# Patient Record
Sex: Male | Born: 1945 | Race: Black or African American | Hispanic: No | Marital: Single | Smoking: Never smoker
Health system: Southern US, Community
[De-identification: ages and names within clinical notes are randomized; demographics above are authoritative.]

## PROBLEM LIST (undated history)

## (undated) DIAGNOSIS — K219 Gastro-esophageal reflux disease without esophagitis: Secondary | ICD-10-CM

## (undated) DIAGNOSIS — I2510 Atherosclerotic heart disease of native coronary artery without angina pectoris: Secondary | ICD-10-CM

## (undated) DIAGNOSIS — E119 Type 2 diabetes mellitus without complications: Secondary | ICD-10-CM

## (undated) DIAGNOSIS — I10 Essential (primary) hypertension: Secondary | ICD-10-CM

## (undated) DIAGNOSIS — N189 Chronic kidney disease, unspecified: Secondary | ICD-10-CM

## (undated) DIAGNOSIS — E785 Hyperlipidemia, unspecified: Secondary | ICD-10-CM

## (undated) HISTORY — PX: CARDIAC SURGERY: SHX584

## (undated) HISTORY — PX: PROSTATE SURGERY: SHX751

## (undated) HISTORY — PX: SHOULDER ARTHROSCOPY: SHX128

## (undated) HISTORY — PX: CARDIAC CATHETERIZATION: SHX172

---

## 2003-08-14 ENCOUNTER — Other Ambulatory Visit: Payer: Self-pay

## 2004-05-08 ENCOUNTER — Emergency Department: Payer: Self-pay | Admitting: Internal Medicine

## 2004-05-08 ENCOUNTER — Other Ambulatory Visit: Payer: Self-pay

## 2007-04-09 ENCOUNTER — Other Ambulatory Visit: Payer: Self-pay

## 2007-04-09 ENCOUNTER — Emergency Department: Payer: Self-pay | Admitting: Unknown Physician Specialty

## 2008-01-11 ENCOUNTER — Other Ambulatory Visit: Payer: Self-pay

## 2008-01-11 ENCOUNTER — Observation Stay: Payer: Self-pay | Admitting: Oral and Maxillofacial Surgery

## 2008-03-19 ENCOUNTER — Emergency Department: Payer: Self-pay

## 2009-01-10 ENCOUNTER — Inpatient Hospital Stay: Payer: Self-pay | Admitting: Internal Medicine

## 2009-01-26 ENCOUNTER — Inpatient Hospital Stay: Payer: Self-pay | Admitting: Internal Medicine

## 2009-04-09 ENCOUNTER — Ambulatory Visit: Payer: Self-pay | Admitting: Gastroenterology

## 2009-08-07 ENCOUNTER — Observation Stay: Payer: Self-pay | Admitting: Specialist

## 2009-11-30 DEATH — deceased

## 2010-07-25 ENCOUNTER — Observation Stay: Payer: Self-pay | Admitting: Internal Medicine

## 2010-12-26 ENCOUNTER — Emergency Department: Payer: Self-pay | Admitting: Unknown Physician Specialty

## 2011-09-18 ENCOUNTER — Inpatient Hospital Stay: Payer: Self-pay | Admitting: Internal Medicine

## 2011-09-18 LAB — CBC WITH DIFFERENTIAL/PLATELET
Basophil #: 0.1 10*3/uL (ref 0.0–0.1)
Eosinophil %: 0.7 %
HCT: 36.3 % — ABNORMAL LOW (ref 40.0–52.0)
HGB: 12 g/dL — ABNORMAL LOW (ref 13.0–18.0)
Lymphocyte #: 1.2 10*3/uL (ref 1.0–3.6)
MCV: 88 fL (ref 80–100)
Monocyte #: 0.7 x10 3/mm (ref 0.2–1.0)
Neutrophil #: 9.2 10*3/uL — ABNORMAL HIGH (ref 1.4–6.5)
Neutrophil %: 81.7 %
Platelet: 299 10*3/uL (ref 150–440)
RBC: 4.1 10*6/uL — ABNORMAL LOW (ref 4.40–5.90)
RDW: 14.3 % (ref 11.5–14.5)

## 2011-09-18 LAB — COMPREHENSIVE METABOLIC PANEL
Albumin: 4.1 g/dL (ref 3.4–5.0)
Alkaline Phosphatase: 93 U/L (ref 50–136)
Anion Gap: 13 (ref 7–16)
BUN: 31 mg/dL — ABNORMAL HIGH (ref 7–18)
Bilirubin,Total: 0.3 mg/dL (ref 0.2–1.0)
Calcium, Total: 9.5 mg/dL (ref 8.5–10.1)
Chloride: 101 mmol/L (ref 98–107)
Creatinine: 2.04 mg/dL — ABNORMAL HIGH (ref 0.60–1.30)
EGFR (African American): 38 — ABNORMAL LOW
EGFR (Non-African Amer.): 33 — ABNORMAL LOW
Glucose: 172 mg/dL — ABNORMAL HIGH (ref 65–99)
Osmolality: 283 (ref 275–301)
SGPT (ALT): 33 U/L
Sodium: 136 mmol/L (ref 136–145)
Total Protein: 8.5 g/dL — ABNORMAL HIGH (ref 6.4–8.2)

## 2011-09-18 LAB — TSH: Thyroid Stimulating Horm: 1.43 u[IU]/mL

## 2011-09-18 LAB — APTT: Activated PTT: 32.6 secs (ref 23.6–35.9)

## 2011-09-18 LAB — TROPONIN I: Troponin-I: 0.03 ng/mL

## 2011-09-19 LAB — TROPONIN I: Troponin-I: <0.02 ng/mL

## 2011-09-19 LAB — BASIC METABOLIC PANEL
Anion Gap: 10 (ref 7–16)
Calcium, Total: 8.8 mg/dL (ref 8.5–10.1)
Co2: 23 mmol/L (ref 21–32)
Creatinine: 1.59 mg/dL — ABNORMAL HIGH (ref 0.60–1.30)
EGFR (African American): 52 — ABNORMAL LOW
EGFR (Non-African Amer.): 45 — ABNORMAL LOW
Glucose: 124 mg/dL — ABNORMAL HIGH (ref 65–99)
Osmolality: 283 (ref 275–301)
Potassium: 3.7 mmol/L (ref 3.5–5.1)
Sodium: 138 mmol/L (ref 136–145)

## 2011-09-19 LAB — CBC WITH DIFFERENTIAL/PLATELET
Basophil %: 1.3 %
Eosinophil %: 2.2 %
HCT: 33.2 % — ABNORMAL LOW (ref 40.0–52.0)
HGB: 10.9 g/dL — ABNORMAL LOW (ref 13.0–18.0)
Lymphocyte #: 2.6 10*3/uL (ref 1.0–3.6)
Lymphocyte %: 27.6 %
MCH: 29.1 pg (ref 26.0–34.0)
MCHC: 32.9 g/dL (ref 32.0–36.0)
MCV: 88 fL (ref 80–100)
Monocyte #: 0.9 x10 3/mm (ref 0.2–1.0)
Monocyte %: 9.9 %
Neutrophil #: 5.7 10*3/uL (ref 1.4–6.5)
Neutrophil %: 59 %

## 2011-09-19 LAB — CK TOTAL AND CKMB (NOT AT ARMC)
CK, Total: 637 U/L — ABNORMAL HIGH (ref 35–232)
CK, Total: 582 U/L — ABNORMAL HIGH (ref 35–232)
CK-MB: 3.1 ng/mL (ref 0.5–3.6)

## 2011-09-19 LAB — LIPID PANEL
Cholesterol: 168 mg/dL (ref 0–200)
HDL Cholesterol: 39 mg/dL — ABNORMAL LOW (ref 40–60)
Ldl Cholesterol, Calc: 91 mg/dL (ref 0–100)

## 2011-09-19 LAB — APTT
Activated PTT: >160 secs (ref 23.6–35.9)
Activated PTT: 121.5 secs — ABNORMAL HIGH (ref 23.6–35.9)

## 2014-04-09 ENCOUNTER — Emergency Department: Payer: Self-pay | Admitting: Emergency Medicine

## 2014-04-09 LAB — CBC
HCT: 39.4 % — AB (ref 40.0–52.0)
HGB: 13.2 g/dL (ref 13.0–18.0)
MCH: 30 pg (ref 26.0–34.0)
MCHC: 33.5 g/dL (ref 32.0–36.0)
MCV: 89 fL (ref 80–100)
Platelet: 287 10*3/uL (ref 150–440)
RBC: 4.4 10*6/uL (ref 4.40–5.90)
RDW: 15 % — AB (ref 11.5–14.5)
WBC: 8.2 10*3/uL (ref 3.8–10.6)

## 2014-04-09 LAB — TROPONIN I
Troponin-I: <0.02 ng/mL
Troponin-I: <0.02 ng/mL

## 2014-04-09 LAB — BASIC METABOLIC PANEL
Anion Gap: 8 (ref 7–16)
BUN: 14 mg/dL (ref 7–18)
CREATININE: 1.22 mg/dL (ref 0.60–1.30)
Calcium, Total: 8.9 mg/dL (ref 8.5–10.1)
Chloride: 105 mmol/L (ref 98–107)
Co2: 24 mmol/L (ref 21–32)
EGFR (African American): >60
EGFR (Non-African Amer.): >60
Glucose: 102 mg/dL — ABNORMAL HIGH (ref 65–99)
OSMOLALITY: 274 (ref 275–301)
POTASSIUM: 4.1 mmol/L (ref 3.5–5.1)
SODIUM: 137 mmol/L (ref 136–145)

## 2014-04-13 ENCOUNTER — Ambulatory Visit: Payer: Self-pay | Admitting: Internal Medicine

## 2014-09-24 NOTE — H&P (Signed)
PATIENT NAME:  Justin Keith, Justin Keith MR#:  [id number] DATE OF BIRTH:  1946-03-21  DATE OF ADMISSION:  09/18/2011  PRIMARY CARE PHYSICIAN: [facility] CARDIOLOGIST: Dr. Clayborn Bigness  CHIEF COMPLAINT: Chest pain.   HISTORY OF PRESENT ILLNESS: A 69 year old male who has history of coronary artery disease with previous coronary artery bypass graft, hypertension, hyperlipidemia, gastroesophageal reflux disease. Today he presented with chest pain while he was doing yard work. He said he was cranking up the lawn mower and then he started having sharp, stabbing left-sided chest pain then it was radiating to his abdomen, to his bilateral upper extremities. Then he went inside to his house and then he started having chest pain again. He has nitroglycerin at home but he did not try the nitroglycerin today. He said he was sitting on his porch and then he was having chest pain again so he called EMS. He received aspirin by EMS. His last admission for chest pain was 07/2010 and he had a cardiac catheterization done at that time which showed that the patient had ejection fraction of 50% to 55%. He has severe multivessel native disease with patent grafts and they recommended medical management at that time. He denied any shortness of breath, nausea, diaphoresis or dizziness. He denies any cough or pleuritic chest pain. He said his chest pain was similar to the pain that he had when he had the coronary artery bypass grafting . He is being admitted for chest pain, unstable angina with underlying coronary artery disease and previous coronary artery bypass graft.   REVIEW OF SYSTEMS: He denies any fever or weakness. No acute change in vision. No headache. No dizziness. No cough. Presents with chest pain but he denies any dyspnea on exertion. No nausea, vomiting, abdominal pain, GI bleed. No dysuria. No frequency. He denies any thyroid problems. No anemia. No rash. No joint pains. No swelling. No focal numbness or weakness. No  anxiety.   PAST MEDICAL HISTORY:  1. Coronary artery disease with previous coronary artery bypass graft. He had last catheterization done in 07/2010 and 07/2009.  2. Hypertension.  3. Hyperlipidemia.  4. Gastroesophageal reflux disease.   PAST SURGICAL HISTORY:  1. Prostate surgery. 2. Coronary artery bypass graft. 3. Right shoulder surgery.   ALLERGIES TO MEDICATIONS: None.   HOME MEDICATIONS: His home medications which are reviewed with the bottles the patient had:  1. Aspirin 81 mg daily.  2. Atorvastatin 80 mg at bedtime. 3. Norvasc 10 mg daily.  4. Ranitidine 150 mg b.i.d.  5. He no longer taking Plavix at this time.   SOCIAL HISTORY: He says he smokes occasionally; a pack of cigarettes will last him 2 to 3 weeks. He smokes cigars and dips tobacco. Denies drinking alcohol or drug use.   FAMILY HISTORY: Positive for coronary artery disease. His mother had coronary artery disease.   PHYSICAL EXAMINATION:  VITAL SIGNS: Heart rate 73, respiratory rate 16, blood pressure 147/91, saturating 98% on oxygen. Currently heart rate 73, blood pressure 167/75, saturating 98% on 2 liters nasal cannula.   GENERAL: This is an elderly African American male, obese, currently he is chest pain free.   HEENT: Bilateral pupils are equal. Extraocular muscles are intact. No scleral icterus. No conjunctivitis. Oral mucosa is moist. No pallor.   NECK: No thyroid tenderness, enlargement or nodule. Neck is supple. No masses, nontender. No adenopathy. No JVD. No carotid bruits.   CHEST: Bilateral breath sounds are clear. No wheeze. Normal effort. No respiratory distress.  HEART: Heart sounds are regular. No murmur. Good peripheral pulses. No lower extremity edema.   ABDOMEN: Soft, nontender. Normal bowel sounds. No hepatomegaly. No bruit. No masses.   RECTAL: Deferred. Murphy's sign is negative.   NEUROLOGIC: He is awake, alert, oriented to time, place, and person. Poor insight. Cranial nerves are  intact. Moving all extremities against gravity. No cyanosis, clubbing.   SKIN: No rash. No lesions.   LABORATORY, DIAGNOSTIC AND RADIOLOGICAL DATA: White count 11.2, hemoglobin 12, platelet count 299,000. BMP: Sodium 136, potassium 3.7, BUN 31, creatinine 2.04. His creatinine was 1.4 in July 2012. Glucose elevated at 172. Troponin 0.03.Marland Kitchen TSH 1.43. Chest x-ray shows mild cardiomegaly. No acute cardiopulmonary disease. His EKG shows sinus rhythm at 79. He has nonspecific T wave changes but no acute ischemic changes. No significant changes from prior EKGs.   IMPRESSION:  1. Chest pain, unstable angina. Rule out myocardial infarction.  2. Coronary artery disease with previous coronary artery bypass graft. 3. Hypertension.  4. Hyperlipidemia.  5. Acute on chronic renal failure. 6. Gastroesophageal reflux disease. 7. Hyperglycemia. 8. Mild leukocytosis.   PLAN: A 69 year old male who has history of coronary artery disease, previous coronary artery bypass graft, hypertension, and hyperlipidemia. He presented with chest pain while he was working in the yard, then he started having chest pain again while he was in the house. The chest pain was sharp in character but has some association with exertion. He says the chest pain is similar to when he had his bypass and MI. He already got aspirin by EMS. We will continue that. Will continue his high-dose statin. Continue Norvasc. He has already been started on nitro patch. Will also give him low dose beta blocker. His renal function has worsened. I am going to give him some IV hydration. I will check an ultrasound of the kidney on him in the morning to rule out any hydronephrosis. He has hyperglycemia also. I am going to check an HbA1c level on him. Will get a cardiology consultation with Dr. Clayborn Bigness. He had catheterization done in February 2012. At that time showed significant multivessel native disease but patent graft. Will leave it to cardiologist for stress  test versus a repeat cardiac catheterization but his renal function needs to be monitored closely if he goes for cardiac catheterization. I am also going to start him on a heparin drip because he has unstable angina. Cannot give him Lovenox at this time with his renal failure.   TIME SPENT WITH ADMISSION AND COORDINATION: 50 minutes.   ____________________________ Mena Pauls, MD ag:cms D: 09/18/2011 22:20:25 ET T: 09/19/2011 05:44:27 ET JOB#: [id number]  cc: Mena Pauls, MD, <Dictator> Graball MD ELECTRONICALLY SIGNED 10/11/2011 14:06

## 2014-09-24 NOTE — Discharge Summary (Signed)
PATIENT NAME:  BRADEY, LUZIER MR#:  [id number] DATE OF BIRTH:  13-Sep-1945  DATE OF ADMISSION:  09/18/2011 DATE OF DISCHARGE:  09/19/2011  PRESENTING COMPLAINT: Chest pain.   DISCHARGE DIAGNOSES:  1. Unstable angina.  2. Coronary artery disease, status post coronary artery bypass graft in the past.  3. Hypertension.  4. Hyperlipidemia.  5. New onset diabetes.   CONDITION ON DISCHARGE: Fair.   MEDICATIONS:  1. Aspirin 81 mg daily.  2. Ranitidine 150 mg p.o. daily.  3. Atorvastatin 80 mg at bedtime.  4. Amlodipine 10 mg daily.  5. Nitroglycerin sublingual 0.4 mg p.r.n. chest pain.   FOLLOW-UP: 1. Follow-up with Dr. Clayborn Bigness within one week.  2. Follow-up with Dr. Denton Lank in 1 to 2 weeks.   LABORATORY, DIAGNOSTIC, AND RADIOLOGICAL DATA: Cardiac enzymes x2 negative. Hemoglobin and hematocrit 10.9 and 33.2, platelet count 277, white count 9.6, glucose 124, BUN 30, creatinine 1.59, sodium 138, potassium 3.7, chloride 105, bicarb 23, calcium 8.8. Cholesterol 168, triglycerides 192, HDL 91. Hemoglobin A1c 7.2. TSH 1.43.   CONSULTATION: Cardiac consultation with Dr. Clayborn Bigness who recommends continued medical management. No indication for further intervention.   BRIEF SUMMARY OF HOSPITAL COURSE: Mr. Golab is a 69 year old African American gentleman who came to the Emergency Room with:  1. Chest pain/unstable angina. The patient started having chest discomfort after he complained of mowing the lawn when he was trying to crank up his blower. He had a couple of episodes, called EMTs, given aspirin and nitro and brought to the Emergency Room. EKG showed no acute changes. Cardiac enzymes remained negative. The patient is chest pain free since admission. He was started on heparin drip. His last cath in February 2012 with patent grafts and severe multivessel disease in the native coronaries. Medical management is suggested at this time after the patient was seen by Dr. Clayborn Bigness. The patient  ambulated in the hallways without any chest discomfort. He is given p.r.n. nitroglycerin. The patient has been off Plavix. He will continue aspirin, Norvasc, and statins.   2. Hypertension, on Norvasc.  3. 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 The patient received IVF and appears euvolemic.  4. New onset diabetes type II. Hemoglobin A1c is 7.2. Discussed lifestyle changes and exercise. If sugars remain more than 150, will defer primary care physician to start p.o. medication.  5. Hyperlipidemia, on statins.  Hospital stay otherwise remained stable. The patient did request to go home since he was doing well. He will follow-up with Dr. Clayborn Bigness as outpatient.   TIME SPENT: 40 minutes.   ____________________________ Hart Rochester Posey Pronto, MD sap:drc D: 09/19/2011 14:11:53 ET T: 09/20/2011 15:37:34 ET JOB#: [id number]  cc: Johnpatrick Jenny A. Posey Pronto, MD, <Dictator> Dwayne D. Clayborn Bigness, MD Sarah "Baltazar Apo, MD Ilda Basset MD ELECTRONICALLY SIGNED 09/22/2011 13:21

## 2015-12-07 ENCOUNTER — Observation Stay
Admission: EM | Admit: 2015-12-07 | Discharge: 2015-12-08 | Disposition: A | Discharge status: Home / Self Care | Payer: Medicare Other | Attending: Internal Medicine | Admitting: Internal Medicine

## 2015-12-07 ENCOUNTER — Emergency Department: Payer: Medicare Other

## 2015-12-07 DIAGNOSIS — R079 Chest pain, unspecified: Principal | ICD-10-CM | POA: Diagnosis present

## 2015-12-07 DIAGNOSIS — I10 Essential (primary) hypertension: Secondary | ICD-10-CM | POA: Diagnosis not present

## 2015-12-07 DIAGNOSIS — Z9114 Patient's other noncompliance with medication regimen: Secondary | ICD-10-CM | POA: Diagnosis not present

## 2015-12-07 DIAGNOSIS — Z79899 Other long term (current) drug therapy: Secondary | ICD-10-CM | POA: Insufficient documentation

## 2015-12-07 DIAGNOSIS — I214 Non-ST elevation (NSTEMI) myocardial infarction: Secondary | ICD-10-CM

## 2015-12-07 DIAGNOSIS — E785 Hyperlipidemia, unspecified: Secondary | ICD-10-CM | POA: Diagnosis not present

## 2015-12-07 DIAGNOSIS — E119 Type 2 diabetes mellitus without complications: Secondary | ICD-10-CM | POA: Insufficient documentation

## 2015-12-07 DIAGNOSIS — F17220 Nicotine dependence, chewing tobacco, uncomplicated: Secondary | ICD-10-CM | POA: Insufficient documentation

## 2015-12-07 DIAGNOSIS — Z951 Presence of aortocoronary bypass graft: Secondary | ICD-10-CM | POA: Diagnosis not present

## 2015-12-07 DIAGNOSIS — Z9889 Other specified postprocedural states: Secondary | ICD-10-CM | POA: Diagnosis not present

## 2015-12-07 DIAGNOSIS — Z23 Encounter for immunization: Secondary | ICD-10-CM | POA: Insufficient documentation

## 2015-12-07 DIAGNOSIS — Z955 Presence of coronary angioplasty implant and graft: Secondary | ICD-10-CM | POA: Insufficient documentation

## 2015-12-07 DIAGNOSIS — Z7984 Long term (current) use of oral hypoglycemic drugs: Secondary | ICD-10-CM | POA: Insufficient documentation

## 2015-12-07 DIAGNOSIS — I252 Old myocardial infarction: Secondary | ICD-10-CM | POA: Diagnosis not present

## 2015-12-07 DIAGNOSIS — I2510 Atherosclerotic heart disease of native coronary artery without angina pectoris: Secondary | ICD-10-CM | POA: Diagnosis not present

## 2015-12-07 HISTORY — DX: Hyperlipidemia, unspecified: E78.5

## 2015-12-07 HISTORY — DX: Type 2 diabetes mellitus without complications: E11.9

## 2015-12-07 HISTORY — DX: Essential (primary) hypertension: I10

## 2015-12-07 LAB — CBC
HCT: 37.5 % — ABNORMAL LOW (ref 40.0–52.0)
Hemoglobin: 12.7 g/dL — ABNORMAL LOW (ref 13.0–18.0)
MCH: 29.9 pg (ref 26.0–34.0)
MCHC: 33.9 g/dL (ref 32.0–36.0)
MCV: 88.2 fL (ref 80.0–100.0)
PLATELETS: 267 10*3/uL (ref 150–440)
RBC: 4.25 MIL/uL — AB (ref 4.40–5.90)
RDW: 14.5 % (ref 11.5–14.5)
WBC: 10.2 10*3/uL (ref 3.8–10.6)

## 2015-12-07 LAB — BASIC METABOLIC PANEL
ANION GAP: 7 (ref 5–15)
BUN: 18 mg/dL (ref 6–20)
CALCIUM: 9.3 mg/dL (ref 8.9–10.3)
CO2: 25 mmol/L (ref 22–32)
Chloride: 104 mmol/L (ref 101–111)
Creatinine, Ser: 1.16 mg/dL (ref 0.61–1.24)
GLUCOSE: 111 mg/dL — AB (ref 65–99)
POTASSIUM: 3.8 mmol/L (ref 3.5–5.1)
SODIUM: 136 mmol/L (ref 135–145)

## 2015-12-07 LAB — TROPONIN I
TROPONIN I: 0.05 ng/mL — AB (ref <0.03)
Troponin I: <0.03 ng/mL (ref <0.03)

## 2015-12-07 LAB — GLUCOSE, CAPILLARY: GLUCOSE-CAPILLARY: 146 mg/dL — AB (ref 65–99)

## 2015-12-07 MED ORDER — HYDROCODONE-ACETAMINOPHEN 5-325 MG PO TABS: 1.0000 | ORAL_TABLET | ORAL | Status: DC | PRN

## 2015-12-07 MED ORDER — ACETAMINOPHEN 650 MG RE SUPP: 650.0000 mg | Freq: Four times a day (QID) | RECTAL | Status: DC | PRN

## 2015-12-07 MED ORDER — PNEUMOCOCCAL VAC POLYVALENT 25 MCG/0.5ML IJ INJ
0.5000 mL | INJECTION | INTRAMUSCULAR | Status: AC
Start: 2015-12-08 — End: 2015-12-08
  Administered 2015-12-08: 0.5 mL via INTRAMUSCULAR
  Filled 2015-12-07: qty 0.5

## 2015-12-07 MED ORDER — NITROGLYCERIN 2 % TD OINT
0.5000 [in_us] | TOPICAL_OINTMENT | Freq: Four times a day (QID) | TRANSDERMAL | Status: DC
  Administered 2015-12-07: 1 [in_us] via TOPICAL
  Administered 2015-12-07: 0.5 [in_us] via TOPICAL
  Administered 2015-12-08: 1 [in_us] via TOPICAL
  Filled 2015-12-07 (×3): qty 1

## 2015-12-07 MED ORDER — ASPIRIN 81 MG PO CHEW
81.0000 mg | CHEWABLE_TABLET | Freq: Every day | ORAL | Status: DC
  Administered 2015-12-07 – 2015-12-08 (×2): 81 mg via ORAL
  Filled 2015-12-07 (×2): qty 1

## 2015-12-07 MED ORDER — ONDANSETRON HCL 4 MG/2ML IJ SOLN: 4.0000 mg | Freq: Four times a day (QID) | INTRAMUSCULAR | Status: DC | PRN

## 2015-12-07 MED ORDER — BISACODYL 5 MG PO TBEC: 5.0000 mg | DELAYED_RELEASE_TABLET | Freq: Every day | ORAL | Status: DC | PRN

## 2015-12-07 MED ORDER — ONDANSETRON HCL 4 MG PO TABS: 4.0000 mg | ORAL_TABLET | Freq: Four times a day (QID) | ORAL | Status: DC | PRN

## 2015-12-07 MED ORDER — DOCUSATE SODIUM 100 MG PO CAPS
100.0000 mg | ORAL_CAPSULE | Freq: Two times a day (BID) | ORAL | Status: DC
  Administered 2015-12-07 – 2015-12-08 (×2): 100 mg via ORAL
  Filled 2015-12-07 (×2): qty 1

## 2015-12-07 MED ORDER — ATORVASTATIN CALCIUM 20 MG PO TABS
80.0000 mg | ORAL_TABLET | Freq: Every day | ORAL | Status: DC
  Administered 2015-12-07 – 2015-12-08 (×2): 80 mg via ORAL
  Filled 2015-12-07 (×2): qty 4

## 2015-12-07 MED ORDER — INSULIN ASPART 100 UNIT/ML ~~LOC~~ SOLN: 0.0000 [IU] | Freq: Three times a day (TID) | SUBCUTANEOUS | Status: DC

## 2015-12-07 MED ORDER — ENOXAPARIN SODIUM 40 MG/0.4ML ~~LOC~~ SOLN
40.0000 mg | SUBCUTANEOUS | Status: DC
  Administered 2015-12-07: 40 mg via SUBCUTANEOUS
  Filled 2015-12-07: qty 0.4

## 2015-12-07 MED ORDER — ACETAMINOPHEN 325 MG PO TABS: 650.0000 mg | ORAL_TABLET | Freq: Four times a day (QID) | ORAL | Status: DC | PRN

## 2015-12-07 MED ORDER — FAMOTIDINE 20 MG PO TABS
20.0000 mg | ORAL_TABLET | Freq: Every day | ORAL | Status: DC
  Administered 2015-12-07 – 2015-12-08 (×2): 20 mg via ORAL
  Filled 2015-12-07 (×2): qty 1

## 2015-12-07 MED ORDER — NITROGLYCERIN 0.4 MG SL SUBL: 0.4000 mg | SUBLINGUAL_TABLET | SUBLINGUAL | Status: DC | PRN

## 2015-12-07 MED ORDER — TRAZODONE HCL 50 MG PO TABS: 25.0000 mg | ORAL_TABLET | Freq: Every evening | ORAL | Status: DC | PRN

## 2015-12-07 MED ORDER — ASPIRIN 81 MG PO CHEW
324.0000 mg | CHEWABLE_TABLET | Freq: Once | ORAL | Status: AC
  Administered 2015-12-07: 324 mg via ORAL
  Filled 2015-12-07: qty 4

## 2015-12-07 MED ORDER — LISINOPRIL 10 MG PO TABS
10.0000 mg | ORAL_TABLET | Freq: Every day | ORAL | Status: DC
  Administered 2015-12-07 – 2015-12-08 (×2): 10 mg via ORAL
  Filled 2015-12-07 (×2): qty 1

## 2015-12-07 MED ORDER — AMLODIPINE BESYLATE 5 MG PO TABS
10.0000 mg | ORAL_TABLET | Freq: Every day | ORAL | Status: DC
  Administered 2015-12-07 – 2015-12-08 (×2): 10 mg via ORAL
  Filled 2015-12-07 (×2): qty 2

## 2015-12-07 MED ORDER — METFORMIN HCL 500 MG PO TABS: 500.0000 mg | ORAL_TABLET | Freq: Two times a day (BID) | ORAL | Status: DC

## 2015-12-07 NOTE — Progress Notes (Signed)
Pt. admitted to unit, rm260 from ED, report from [place], [place]. Oriented to room, call bell, Ascom phones and staff. Bed in low position. Fall safety plan reviewed, blue non-skid socks in place. Full assessment to Epic; skin assessed with Rockie Neighbours., RN. Telemetry box verified with CCMD and Shana R., NT: [id number]. No reports of chest pain/pressure or discomfort at this time, VSS. Will continue to monitor.

## 2015-12-07 NOTE — H&P (Signed)
[place] [facility] [place] NAME: Justin Keith    MR#:  [id number]  DATE OF BIRTH:  [birth year]  DATE OF ADMISSION:  12/07/2015  PRIMARY CARE PHYSICIAN: [place]   REQUESTING/REFERRING PHYSICIAN:  Loura Pardon  CHIEF COMPLAINT:chest pain   Chief Complaint  Patient presents with  . Chest Pain    HISTORY OF PRESENT ILLNESS:  Jeovanny Kelderman  is a 70 y.o. male with a known history of htn,DMII,HLp,Medication non compliance comes  In with left sided chest pain,Chest pain started 5 days ago on the left side with 5 out of 10 in severity, not radiating to arm, no nausea or vomiting. The patient did have some sweating yesterday. No aggravating or relieving factors. Patient did have abnormal stress test last year and was referred for angiogram by Dr. Clayborn Bigness but patient never followed up.pt admits that he does not take his meds regularly.  PAST MEDICAL HISTORY:   Past Medical History  Diagnosis Date  . Diabetes mellitus without complication ([place])   . Hypertension   . Hyperlipemia     PAST SURGICAL HISTOIRY:   Past Surgical History  Procedure Laterality Date  . Prostate surgery    . Cardiac surgery    . Shoulder arthroscopy      SOCIAL HISTORY:   Social History  Substance Use Topics  . Smoking status: Never Smoker   . Smokeless tobacco: Current User  . Alcohol Use: No    FAMILY HISTORY:  No family history on file.  DRUG ALLERGIES:  No Known Allergies  REVIEW OF SYSTEMS:  CONSTITUTIONAL: No fever, fatigue or weakness.  EYES: No blurred or double vision.  EARS, NOSE, AND THROAT: No tinnitus or ear pain.  RESPIRATORY: No cough, shortness of breath, wheezing or hemoptysis.  CARDIOVASCULAR: left side chest pain GASTROINTESTINAL: No nausea, vomiting, diarrhea or abdominal pain.  GENITOURINARY: No dysuria, hematuria.  ENDOCRINE: No polyuria, nocturia,  HEMATOLOGY: No anemia, easy bruising or bleeding SKIN: No rash  or lesion. MUSCULOSKELETAL: No joint pain or arthritis.   NEUROLOGIC: No tingling, numbness, weakness.  PSYCHIATRY: No anxiety or depression.   MEDICATIONS AT HOME:   Prior to Admission medications   Medication Sig Start Date End Date Taking? Authorizing Provider  amLODipine (NORVASC) 10 MG tablet Take 1 tablet by mouth daily.   Yes Historical Provider, MD  atorvastatin (LIPITOR) 80 MG tablet Take 1 tablet by mouth daily.   Yes Historical Provider, MD  lisinopril (PRINIVIL,ZESTRIL) 10 MG tablet Take 1 tablet by mouth daily.   Yes Historical Provider, MD  metFORMIN (GLUCOPHAGE) 500 MG tablet Take 1 tablet by mouth 2 (two) times daily.   Yes Historical Provider, MD  nitroGLYCERIN (NITROSTAT) 0.4 MG SL tablet Place 0.4 mg under the tongue every 5 (five) minutes as needed for chest pain.   Yes Historical Provider, MD  ranitidine (ZANTAC) 150 MG capsule Take 1 capsule by mouth 2 (two) times daily.   Yes Historical Provider, MD      VITAL SIGNS:  Blood pressure 149/72, pulse 62, temperature 98.1 F (36.7 C), temperature source Oral, resp. rate 18, height 5\' 8"  (1.727 m), weight 113.399 kg (250 lb), SpO2 98 %.  PHYSICAL EXAMINATION:  GENERAL:  70 y.o.-year-old patient lying in the bed with no acute distress.  EYES: Pupils equal, round, reactive to light and accommodation. No scleral icterus. Extraocular muscles intact.  HEENT: Head atraumatic, normocephalic. Oropharynx and nasopharynx clear.  NECK:  Supple, no jugular venous distention. No  thyroid enlargement, no tenderness.  LUNGS: Normal breath sounds bilaterally, no wheezing, rales,rhonchi or crepitation. No use of accessory muscles of respiration.  CARDIOVASCULAR: S1, S2 normal. No murmurs, rubs, or gallops.  ABDOMEN: Soft, nontender, nondistended. Bowel sounds present. No organomegaly or mass.  EXTREMITIES: No pedal edema, cyanosis, or clubbing.  NEUROLOGIC: Cranial nerves II through XII are intact. Muscle strength 5/5 in all  extremities. Sensation intact. Gait not checked.  PSYCHIATRIC: The patient is alert and oriented x 3.  SKIN: No obvious rash, lesion, or ulcer.   LABORATORY PANEL:   CBC  Recent Labs Lab 12/07/15 1208  WBC 10.2  HGB 12.7*  HCT 37.5*  PLT 267   ------------------------------------------------------------------------------------------------------------------  Chemistries   Recent Labs Lab 12/07/15 1208  NA 136  K 3.8  CL 104  CO2 25  GLUCOSE 111*  BUN 18  CREATININE 1.16  CALCIUM 9.3   ------------------------------------------------------------------------------------------------------------------  Cardiac Enzymes  Recent Labs Lab 12/07/15 1208  TROPONINI 0.05*   ------------------------------------------------------------------------------------------------------------------  RADIOLOGY:  Dg Chest 2 View  12/07/2015  CLINICAL DATA:  Chest pain and shortness of breath, onset of symptoms this morning, history hypertension, diabetes mellitus, cardiac surgery EXAM: CHEST  2 VIEW COMPARISON:  14 2013 FINDINGS: Upper normal heart size post CABG. Mediastinal contours and pulmonary vascularity normal. Lungs clear. No pleural effusion pneumothorax. No acute osseous findings. Degenerative changes RIGHT AC joint. IMPRESSION: Post CABG. No acute abnormalities. Electronically Signed   By: Lavonia Dana M.D.   On: 12/07/2015 12:25    EKG:   Orders placed or performed during the hospital encounter of 12/07/15  . EKG 12-Lead  . EKG 12-Lead  . ED EKG within 10 minutes  . ED EKG within 10 minutes  Normal sinus rhythm 62 bpm no ST-T changes.  IMPRESSION AND PLAN:   #1 left-sided chest pain concerning for acute coronary syndrome. Patient has history of essential hypertension, diabetes, or artery disease, CABG: medication noncompliance, abnormal stress test last year. Continue aspirin, nitrates, statins.unable to  Give  beta blockers because of bradycardia with heart rate being in  50s. Audiology consult. #2. diabetes mellitus type 2: Compliance with medications. Insulin sliding scale coverage. Hold metformin for possible cardiac catheter if he needs. #3 hyperlipidemia: Continue statins. 4 .history of coronary artery disease, CABG, stent before.  GI, DVT prophylaxis. All the records are reviewed and case discussed with ED provider. Management plans discussed with the patient, family and they are in agreement.  CODE STATUS: full  TOTAL TIME TAKING CARE OF THIS PATIENT: 55 minutes.    Epifanio Lesches M.D on 12/07/2015 at 2:52 PM  Between 7am to 6pm - Pager - [phone]  After 6pm go to [email or web address] - password [email or web address] [facility]  Office  [phone]  CC: Primary care physician; [facility]  Note: This dictation was prepared with Dragon dictation along with smaller phrase technology. Any transcriptional errors that result from this process are unintentional.  ----------------------------------------------------------------------------------------------------------             [facility]  Office  [phone]  CC: Primary care physician; [facility]  Note: This dictation was prepared with Dragon dictation along with smaller phrase technology. Any transcriptional errors that result from this process are unintentional.

## 2015-12-07 NOTE — ED Provider Notes (Signed)
[facility] Emergency Department Provider Note   ____________________________________________  Time seen: Approximately 1:04 PM  I have reviewed the triage vital signs and the nursing notes.   HISTORY  Chief Complaint Chest Pain    HPI Justin Keith is a 70 y.o. male with diabetes, hypertension, hyperlipidemia, history of coronary artery disease status post CABG who presents for evaluation of intermittent left pressure-like chest pain since this morning, gradual onset, no modifying factors, radiates into his throat, currently resolved. Not worse with exertion or associated with any shortness of breath or sudden sweating. He reports it does feel similar to his prior heart attack.   Past Medical History  Diagnosis Date  . Diabetes mellitus without complication ([place])   . Hypertension   . Hyperlipemia     Patient Active Problem List   Diagnosis Date Noted  . Chest pain 12/07/2015    Past Surgical History  Procedure Laterality Date  . Prostate surgery    . Cardiac surgery    . Shoulder arthroscopy      Current Outpatient Rx  Name  Route  Sig  Dispense  Refill  . amLODipine (NORVASC) 10 MG tablet   Oral   Take 1 tablet by mouth daily.         Marland Kitchen atorvastatin (LIPITOR) 80 MG tablet   Oral   Take 1 tablet by mouth daily.         Marland Kitchen lisinopril (PRINIVIL,ZESTRIL) 10 MG tablet   Oral   Take 1 tablet by mouth daily.         . metFORMIN (GLUCOPHAGE) 500 MG tablet   Oral   Take 1 tablet by mouth 2 (two) times daily.         . nitroGLYCERIN (NITROSTAT) 0.4 MG SL tablet   Sublingual   Place 0.4 mg under the tongue every 5 (five) minutes as needed for chest pain.         . ranitidine (ZANTAC) 150 MG capsule   Oral   Take 1 capsule by mouth 2 (two) times daily.           Allergies Review of patient's allergies indicates no known allergies.  No family history on file.  Social History Social History  Substance Use Topics    . Smoking status: Never Smoker   . Smokeless tobacco: Current User  . Alcohol Use: No    Review of Systems Constitutional: No fever/chills Eyes: No visual changes. ENT: No sore throat. Cardiovascular: + chest pain. Respiratory: Denies shortness of breath. Gastrointestinal: No abdominal pain.  No nausea, no vomiting.  No diarrhea.  No constipation. Genitourinary: Negative for dysuria. Musculoskeletal: Negative for back pain. Skin: Negative for rash. Neurological: Negative for headaches, focal weakness or numbness.  10-point ROS otherwise negative.  ____________________________________________   PHYSICAL EXAM:  VITAL SIGNS: ED Triage Vitals  Enc Vitals Group     BP 12/07/15 1205 149/72 mmHg     Pulse Rate 12/07/15 1202 62     Resp 12/07/15 1202 18     Temp 12/07/15 1202 98.1 F (36.7 C)     Temp Source 12/07/15 1202 Oral     SpO2 12/07/15 1202 98 %     Weight 12/07/15 1202 250 lb (113.399 kg)     Height 12/07/15 1202 5\' 8"  (1.727 m)     Head Cir --      Peak Flow --      Pain Score 12/07/15 1204 8     Pain Loc --  Pain Edu? --      Excl. in [place]? --     Constitutional: Alert and oriented. Well appearing and in no acute distress. Eyes: Conjunctivae are normal. PERRL. EOMI. Head: Atraumatic. Nose: No congestion/rhinnorhea. Mouth/Throat: Mucous membranes are moist.  Oropharynx non-erythematous. Neck: No stridor.   Cardiovascular: Normal rate, regular rhythm. Grossly normal heart sounds.  Good peripheral circulation. Respiratory: Normal respiratory effort.  No retractions. Lungs CTAB. Gastrointestinal: Soft and nontender. No distention.  No CVA tenderness. Genitourinary: deferred Musculoskeletal: No lower extremity tenderness nor edema.  No joint effusions. Neurologic:  Normal speech and language. No gross focal neurologic deficits are appreciated. No gait instability. Skin:  Skin is warm, dry and intact. No rash noted. Psychiatric: Mood and affect are normal.  Speech and behavior are normal.  ____________________________________________   LABS (all labs ordered are listed, but only abnormal results are displayed)  Labs Reviewed  BASIC METABOLIC PANEL - Abnormal; Notable for the following:    Glucose, Bld 111 (*)    All other components within normal limits  CBC - Abnormal; Notable for the following:    RBC 4.25 (*)    Hemoglobin 12.7 (*)    HCT 37.5 (*)    All other components within normal limits  TROPONIN I - Abnormal; Notable for the following:    Troponin I 0.05 (*)    All other components within normal limits  TROPONIN I  TROPONIN I  HEMOGLOBIN A1C   ____________________________________________  EKG  ED ECG REPORT I, Joanne Gavel, the attending physician, personally viewed and interpreted this ECG.   Date: 12/07/2015  EKG Time: 12:03  Rate: 62  Rhythm: normal sinus rhythm  Axis: normal  Intervals:none  ST&T Change: No acute ST elevation. Minimal voltage criteria for LVH.  ____________________________________________  RADIOLOGY  CXR IMPRESSION: Post CABG.  No acute abnormalities.  ____________________________________________   PROCEDURES  Procedure(s) performed: None  Procedures  Critical Care performed: No  ____________________________________________   INITIAL IMPRESSION / ASSESSMENT AND PLAN / ED COURSE  Pertinent labs & imaging results that were available during my care of the patient were reviewed by me and considered in my medical decision making (see chart for details).  Justin Keith is a 70 y.o. male with diabetes, hypertension, hyperlipidemia, history of coronary artery disease status post CABG who presents for evaluation of intermittent left pressure-like chest pain since this morning, currently resolved. On exam, he is well-appearing and in no acute distress. His vital signs are stable, he is afebrile. EKG not consistent with acute ischemia. Troponin is elevated at 0.05 concerning for  NSTEMI. BMP unremarkable. CBC with mild anemia. Chest x-ray clear. Aspirin ordered. Case discussed with hospitalist for admission at 2:15 PM. ____________________________________________   FINAL CLINICAL IMPRESSION(S) / ED DIAGNOSES  Final diagnoses:  Chest pain, unspecified chest pain type  NSTEMI (non-ST elevated myocardial infarction) ([place])      NEW MEDICATIONS STARTED DURING THIS VISIT:  New Prescriptions   No medications on file     Note:  This document was prepared using Dragon voice recognition software and may include unintentional dictation errors.    Joanne Gavel, MD 12/07/15 [phone]

## 2015-12-07 NOTE — ED Notes (Signed)
NAD noted at this time. Explained delay to patient at this time. Pt's brother at bedside. Pt states understanding. Denies needs at this time. Will continue to monitor for further patient needs.

## 2015-12-07 NOTE — ED Notes (Signed)
Pt c/o chest pain that started this morning around 7-8am.. Denies SOB/N/V/diaphoresis.. Pt has a cardiac hx.

## 2015-12-07 NOTE — ED Notes (Signed)
Pt up to go to the bathroom. NAD noted at this time.

## 2015-12-07 NOTE — ED Notes (Signed)
Report given to Maddie H, RN

## 2015-12-08 LAB — BASIC METABOLIC PANEL
ANION GAP: 6 (ref 5–15)
BUN: 17 mg/dL (ref 6–20)
CALCIUM: 9.2 mg/dL (ref 8.9–10.3)
CO2: 25 mmol/L (ref 22–32)
Chloride: 105 mmol/L (ref 101–111)
Creatinine, Ser: 1.13 mg/dL (ref 0.61–1.24)
GFR calc Af Amer: >60 mL/min (ref >60)
GFR calc non Af Amer: >60 mL/min (ref >60)
GLUCOSE: 126 mg/dL — AB (ref 65–99)
POTASSIUM: 4.5 mmol/L (ref 3.5–5.1)
Sodium: 136 mmol/L (ref 135–145)

## 2015-12-08 LAB — CBC
HEMATOCRIT: 37 % — AB (ref 40.0–52.0)
HEMOGLOBIN: 12.6 g/dL — AB (ref 13.0–18.0)
MCH: 30.4 pg (ref 26.0–34.0)
MCHC: 34 g/dL (ref 32.0–36.0)
MCV: 89.3 fL (ref 80.0–100.0)
Platelets: 254 10*3/uL (ref 150–440)
RBC: 4.15 MIL/uL — ABNORMAL LOW (ref 4.40–5.90)
RDW: 14.8 % — ABNORMAL HIGH (ref 11.5–14.5)
WBC: 8.9 10*3/uL (ref 3.8–10.6)

## 2015-12-08 LAB — GLUCOSE, CAPILLARY
Glucose-Capillary: 103 mg/dL — ABNORMAL HIGH (ref 65–99)
Glucose-Capillary: 134 mg/dL — ABNORMAL HIGH (ref 65–99)

## 2015-12-08 LAB — TROPONIN I
Troponin I: <0.03 ng/mL (ref <0.03)
Troponin I: <0.03 ng/mL (ref <0.03)

## 2015-12-08 LAB — HEMOGLOBIN A1C: HEMOGLOBIN A1C: 8.7 % — AB (ref 4.0–6.0)

## 2015-12-08 MED ORDER — AMLODIPINE BESYLATE 10 MG PO TABS: 10.0000 mg | ORAL_TABLET | Freq: Every day | ORAL | Status: DC

## 2015-12-08 MED ORDER — ASPIRIN 81 MG PO CHEW: 81.0000 mg | CHEWABLE_TABLET | Freq: Every day | ORAL | Status: DC

## 2015-12-08 MED ORDER — METFORMIN HCL 500 MG PO TABS: 500.0000 mg | ORAL_TABLET | Freq: Two times a day (BID) | ORAL | Status: DC

## 2015-12-08 MED ORDER — LISINOPRIL 10 MG PO TABS: 10.0000 mg | ORAL_TABLET | Freq: Every day | ORAL | Status: DC

## 2015-12-08 NOTE — Discharge Summary (Signed)
[facility] NAME: Justin Keith    MR#:  [id number]  DATE OF BIRTH:  12-31-1945  DATE OF ADMISSION:  12/07/2015 ADMITTING PHYSICIAN: Epifanio Lesches, MD  DATE OF DISCHARGE: 12/08/2015  PRIMARY CARE PHYSICIAN: [facility]    ADMISSION DIAGNOSIS:  NSTEMI (non-ST elevated myocardial infarction) [facility]) [I21.4] Chest pain [R07.9] Chest pain, unspecified chest pain type [R07.9]  DISCHARGE DIAGNOSIS:  Active Problems:   Chest pain   SECONDARY DIAGNOSIS:   Past Medical History  Diagnosis Date  . Diabetes mellitus without complication ([place])   . Hypertension   . Hyperlipemia     HOSPITAL COURSE:   #1 left-sided chest pain concerning for acute coronary syndrome. Patient has history of essential hypertension, diabetes, or artery disease, CABG: medication noncompliance, abnormal stress test last year. Continue aspirin, nitrates, statins.unable to Give beta blockers because of bradycardia with heart rate being in 50s.   Appreciated cardiology consult, as pt is pain free- d/c home now  And follow in office. #2. diabetes mellitus type 2: Compliance with medications. Insulin sliding scale coverage. Resume metformin. #3 hyperlipidemia: Continue statins. 4 .history of coronary artery disease, CABG, stent before. GI, DVT prophylaxis. 5. Htn   Resume Amlodipine and lisinopril.  DISCHARGE CONDITIONS:   Stable.  CONSULTS OBTAINED:  Treatment Team:  Teodoro Spray, MD  DRUG ALLERGIES:  No Known Allergies  DISCHARGE MEDICATIONS:   Current Discharge Medication List    START taking these medications   Details  aspirin 81 MG chewable tablet Chew 1 tablet (81 mg total) by mouth daily. Qty: 30 tablet, Refills: 0      CONTINUE these medications which have CHANGED   Details  amLODipine (NORVASC) 10 MG tablet Take 1 tablet (10 mg total) by mouth daily. Qty: 30 tablet, Refills: 0    lisinopril  (PRINIVIL,ZESTRIL) 10 MG tablet Take 1 tablet (10 mg total) by mouth daily. Qty: 30 tablet, Refills: 0    metFORMIN (GLUCOPHAGE) 500 MG tablet Take 1 tablet (500 mg total) by mouth 2 (two) times daily. Qty: 60 tablet, Refills: 0      CONTINUE these medications which have NOT CHANGED   Details  atorvastatin (LIPITOR) 80 MG tablet Take 1 tablet by mouth daily.    nitroGLYCERIN (NITROSTAT) 0.4 MG SL tablet Place 0.4 mg under the tongue every 5 (five) minutes as needed for chest pain.    ranitidine (ZANTAC) 150 MG capsule Take 1 capsule by mouth 2 (two) times daily.         DISCHARGE INSTRUCTIONS:    Follow with Cardio clinic in 2 weeks.  If you experience worsening of your admission symptoms, develop shortness of breath, life threatening emergency, suicidal or homicidal thoughts you must seek medical attention immediately by calling 911 or calling your MD immediately  if symptoms less severe.  You Must read complete instructions/literature along with all the possible adverse reactions/side effects for all the Medicines you take and that have been prescribed to you. Take any new Medicines after you have completely understood and accept all the possible adverse reactions/side effects.   Please note  You were cared for by a hospitalist during your hospital stay. If you have any questions about your discharge medications or the care you received while you were in the hospital after you are discharged, you can call the unit and asked to speak with the hospitalist on call if the hospitalist that took care of you is not available. Once  you are discharged, your primary care physician will handle any further medical issues. Please note that NO REFILLS for any discharge medications will be authorized once you are discharged, as it is imperative that you return to your primary care physician (or establish a relationship with a primary care physician if you do not have one) for your aftercare needs so  that they can reassess your need for medications and monitor your lab values.    Today   CHIEF COMPLAINT:   Chief Complaint  Patient presents with  . Chest Pain    HISTORY OF PRESENT ILLNESS:  Justin Keith  is a 70 y.o. male with a known history of htn,DMII,HLp,Medication non compliance comes In with left sided chest pain,Chest pain started 5 days ago on the left side with 5 out of 10 in severity, not radiating to arm, no nausea or vomiting. The patient did have some sweating yesterday. No aggravating or relieving factors. Patient did have abnormal stress test last year and was referred for angiogram by Dr. Clayborn Bigness but patient never followed up.pt admits that he does not take his meds regularly.   VITAL SIGNS:  Blood pressure 126/70, pulse 43, temperature 97.8 F (36.6 C), temperature source Oral, resp. rate 20, height 5\' 8"  (1.727 m), weight 109.498 kg (241 lb 6.4 oz), SpO2 99 %.  I/O:   Intake/Output Summary (Last 24 hours) at 12/08/15 1138 Last data filed at 12/08/15 0955  Gross per 24 hour  Intake    360 ml  Output    800 ml  Net   -440 ml    PHYSICAL EXAMINATION:  GENERAL:  70 y.o.-year-old patient lying in the bed with no acute distress.  EYES: Pupils equal, round, reactive to light and accommodation. No scleral icterus. Extraocular muscles intact.  HEENT: Head atraumatic, normocephalic. Oropharynx and nasopharynx clear.  NECK:  Supple, no jugular venous distention. No thyroid enlargement, no tenderness.  LUNGS: Normal breath sounds bilaterally, no wheezing, rales,rhonchi or crepitation. No use of accessory muscles of respiration.  CARDIOVASCULAR: S1, S2 normal. No murmurs, rubs, or gallops.  ABDOMEN: Soft, non-tender, non-distended. Bowel sounds present. No organomegaly or mass.  EXTREMITIES: No pedal edema, cyanosis, or clubbing.  NEUROLOGIC: Cranial nerves II through XII are intact. Muscle strength 5/5 in all extremities. Sensation intact. Gait not checked.   PSYCHIATRIC: The patient is alert and oriented x 3.  SKIN: No obvious rash, lesion, or ulcer.   DATA REVIEW:   CBC  Recent Labs Lab 12/08/15 0632  WBC 8.9  HGB 12.6*  HCT 37.0*  PLT 254    Chemistries   Recent Labs Lab 12/08/15 0632  NA 136  K 4.5  CL 105  CO2 25  GLUCOSE 126*  BUN 17  CREATININE 1.13  CALCIUM 9.2    Cardiac Enzymes  Recent Labs Lab 12/08/15 0632  TROPONINI <0.03    Microbiology Results  No results found for this or any previous visit.  RADIOLOGY:  Dg Chest 2 View  12/07/2015  CLINICAL DATA:  Chest pain and shortness of breath, onset of symptoms this morning, history hypertension, diabetes mellitus, cardiac surgery EXAM: CHEST  2 VIEW COMPARISON:  14 2013 FINDINGS: Upper normal heart size post CABG. Mediastinal contours and pulmonary vascularity normal. Lungs clear. No pleural effusion pneumothorax. No acute osseous findings. Degenerative changes RIGHT AC joint. IMPRESSION: Post CABG. No acute abnormalities. Electronically Signed   By: Lavonia Dana M.D.   On: 12/07/2015 12:25    EKG:   Orders placed or  performed during the hospital encounter of 12/07/15  . EKG 12-Lead  . EKG 12-Lead  . ED EKG within 10 minutes  . ED EKG within 10 minutes      Management plans discussed with the patient, family and they are in agreement.  CODE STATUS:     Code Status Orders        Start     Ordered   12/07/15 1428  Full code   Continuous     12/07/15 1430    Code Status History    Date Active Date Inactive Code Status Order ID Comments User Context   This patient has a current code status but no historical code status.      TOTAL TIME TAKING CARE OF THIS PATIENT: 35 minutes.    Vaughan Basta M.D on 12/08/2015 at 11:38 AM  Between 7am to 6pm - Pager - [phone]  After 6pm go to [email or web address] - password [email or web address] [facility]  Office  [phone]  CC: Primary care physician; [facility]   Note: This dictation was prepared with Dragon dictation along with smaller phrase technology. Any transcriptional errors that result from this process are unintentional.

## 2015-12-08 NOTE — Consult Note (Signed)
[facility]  CARDIOLOGY CONSULT NOTE  Patient ID: Justin Keith MRN: [id number] DOB/AGE: [birth year] 70 y.o.  Admit date: 12/07/2015 Referring Physician Dr. Anselm Jungling Primary Physician   Primary Cardiologist Dr. Clayborn Bigness (last seen 11/15) Reason for Consultation chest pain  Justin Keith is a 70 year old male with history of coronary artery disease, status post coronary artery bypass surgery as well as PCI who was admitted after developing midsternal chest pain. Patient has a history of diabetes, hypertension as well as his coronary disease. He admits to been noncompliant with his medications for 3-4 months. He had been on atorvastatin and high intensity dose for hyperlipidemia as well as amlodipine at 10 mg daily for hypertension, lisinopril 10 mg daily as well as ranitidine twice a day. His diabetes was treated with metformin. He has not had follow-up with his cardiologist or primary care provider for some time. He has ruled out for myocardial infarction and symptoms improved after resuming his medications. His initial serum troponin was 0.05 with subsequent values of less than 0.03. EKG showed no ischemia. He has ruled out for myocardial infarction. Pain is likely secondary to demand secondary to noncompliance with medications. He states he has the resources for the medications but chose not to take them. He understands the importance now.  Review of Systems  Constitutional: Negative.   HENT: Negative.   Eyes: Negative.   Respiratory: Negative.   Cardiovascular: Positive for chest pain.  Gastrointestinal: Negative.   Genitourinary: Negative.   Musculoskeletal: Negative.   Skin: Negative.   Neurological: Negative.   Endo/Heme/Allergies: Negative.   Psychiatric/Behavioral: Negative.     Past Medical History  Diagnosis Date  . Diabetes mellitus without complication ([place])   . Hypertension   . Hyperlipemia     History reviewed. No pertinent family  history.  Social History   Social History  . Marital Status: Single    Spouse Name: N/A  . Number of Children: N/A  . Years of Education: N/A   Occupational History  . Not on file.   Social History Main Topics  . Smoking status: Never Smoker   . Smokeless tobacco: Current User  . Alcohol Use: No  . Drug Use: Not on file  . Sexual Activity: Not on file   Other Topics Concern  . Not on file   Social History Narrative  . No narrative on file    Past Surgical History  Procedure Laterality Date  . Prostate surgery    . Cardiac surgery    . Shoulder arthroscopy       Prescriptions prior to admission  Medication Sig Dispense Refill Last Dose  . amLODipine (NORVASC) 10 MG tablet Take 1 tablet by mouth daily.   unknown at unknown  . atorvastatin (LIPITOR) 80 MG tablet Take 1 tablet by mouth daily.   unknown at unknown  . lisinopril (PRINIVIL,ZESTRIL) 10 MG tablet Take 1 tablet by mouth daily.   unknown at unknown  . metFORMIN (GLUCOPHAGE) 500 MG tablet Take 1 tablet by mouth 2 (two) times daily.   unknown at unknown  . nitroGLYCERIN (NITROSTAT) 0.4 MG SL tablet Place 0.4 mg under the tongue every 5 (five) minutes as needed for chest pain.   prn at prn  . ranitidine (ZANTAC) 150 MG capsule Take 1 capsule by mouth 2 (two) times daily.   unknown at unknown    Physical Exam: Blood pressure 126/70, pulse 43, temperature 97.8 F (36.6 C), temperature source Oral, resp. rate 20, height 5'  8" (1.727 m), weight 109.498 kg (241 lb 6.4 oz), SpO2 99 %.   Wt Readings from Last 1 Encounters:  12/08/15 109.498 kg (241 lb 6.4 oz)     General appearance: alert and cooperative Resp: clear to auscultation bilaterally Chest wall: no tenderness Cardio: regular rate and rhythm GI: soft, non-tender; bowel sounds normal; no masses,  no organomegaly Extremities: extremities normal, atraumatic, no cyanosis or edema Neurologic: Grossly normal  Labs:   Lab Results  Component Value Date   WBC  8.9 12/08/2015   HGB 12.6* 12/08/2015   HCT 37.0* 12/08/2015   MCV 89.3 12/08/2015   PLT 254 12/08/2015    Recent Labs Lab 12/08/15 0632  NA 136  K 4.5  CL 105  CO2 25  BUN 17  CREATININE 1.13  CALCIUM 9.2  GLUCOSE 126*   Lab Results  Component Value Date   CKTOTAL 669* 09/19/2011   CKMB 4.8* 09/19/2011   TROPONINI <0.03 12/08/2015      Radiology: s/p cabg but no acute cardiopulmonary disease.  EKG: nsr with no ischemia  ASSESSMENT AND PLAN: Patient is a 70 year old male with diabetes, coronary artery disease status post coronary bypass grafting and PCI, history of hypertension and hyperlipidemia who presented with chest pain. He has been noncompliant with his medications for some time. He has ruled out for myocardial infarction and symptoms have improved after resuming his medications. Would send him home on atorvastatin 80 mg daily, amlodipine at 10 mg daily, lisinopril at 10 mg daily, when necessary nitroglycerin. Would also resume metformin for diabetes. Would recommend follow-up with Dr. Clayborn Bigness in one to 2 weeks Signed: Teodoro Spray MD, [facility] 12/08/2015, 11:32 AM

## 2015-12-08 NOTE — Progress Notes (Addendum)
Pt. Discharged to home via wc. Discharge instructions and medication regimen reviewed at bedside with patient and brother. Both verbalize understanding of instructions and medication regimen. Prescriptions included with d/c papers. Patient assessment unchanged from this morning. TELE and IV discontinued per policy.   Pneumonia vaccine and appropriate education given prior to discharge.

## 2015-12-10 LAB — GLUCOSE, CAPILLARY: GLUCOSE-CAPILLARY: 105 mg/dL — AB (ref 65–99)

## 2017-01-25 ENCOUNTER — Emergency Department
Admission: EM | Admit: 2017-01-25 | Discharge: 2017-01-26 | Disposition: A | Discharge status: Home / Self Care | Payer: Medicare HMO | Attending: Emergency Medicine | Admitting: Emergency Medicine

## 2017-01-25 ENCOUNTER — Emergency Department: Payer: Medicare HMO

## 2017-01-25 DIAGNOSIS — I10 Essential (primary) hypertension: Secondary | ICD-10-CM | POA: Diagnosis not present

## 2017-01-25 DIAGNOSIS — R079 Chest pain, unspecified: Secondary | ICD-10-CM | POA: Diagnosis not present

## 2017-01-25 DIAGNOSIS — Z7982 Long term (current) use of aspirin: Secondary | ICD-10-CM | POA: Insufficient documentation

## 2017-01-25 DIAGNOSIS — Z7984 Long term (current) use of oral hypoglycemic drugs: Secondary | ICD-10-CM | POA: Insufficient documentation

## 2017-01-25 DIAGNOSIS — Z79899 Other long term (current) drug therapy: Secondary | ICD-10-CM | POA: Insufficient documentation

## 2017-01-25 DIAGNOSIS — E860 Dehydration: Secondary | ICD-10-CM | POA: Insufficient documentation

## 2017-01-25 DIAGNOSIS — E119 Type 2 diabetes mellitus without complications: Secondary | ICD-10-CM | POA: Insufficient documentation

## 2017-01-25 DIAGNOSIS — I2510 Atherosclerotic heart disease of native coronary artery without angina pectoris: Secondary | ICD-10-CM | POA: Insufficient documentation

## 2017-01-25 LAB — BASIC METABOLIC PANEL
ANION GAP: 8 (ref 5–15)
BUN: 24 mg/dL — ABNORMAL HIGH (ref 6–20)
CALCIUM: 9.4 mg/dL (ref 8.9–10.3)
CO2: 22 mmol/L (ref 22–32)
CREATININE: 1.45 mg/dL — AB (ref 0.61–1.24)
Chloride: 106 mmol/L (ref 101–111)
GFR calc Af Amer: 55 mL/min — ABNORMAL LOW (ref >60)
GFR calc non Af Amer: 47 mL/min — ABNORMAL LOW (ref >60)
GLUCOSE: 100 mg/dL — AB (ref 65–99)
Potassium: 4 mmol/L (ref 3.5–5.1)
Sodium: 136 mmol/L (ref 135–145)

## 2017-01-25 LAB — CBC
HCT: 31.9 % — ABNORMAL LOW (ref 40.0–52.0)
HEMOGLOBIN: 11.2 g/dL — AB (ref 13.0–18.0)
MCH: 30.1 pg (ref 26.0–34.0)
MCHC: 35 g/dL (ref 32.0–36.0)
MCV: 86.1 fL (ref 80.0–100.0)
Platelets: 305 10*3/uL (ref 150–440)
RBC: 3.71 MIL/uL — ABNORMAL LOW (ref 4.40–5.90)
RDW: 14.6 % — ABNORMAL HIGH (ref 11.5–14.5)
WBC: 9.2 10*3/uL (ref 3.8–10.6)

## 2017-01-25 LAB — TROPONIN I

## 2017-01-25 MED ORDER — ASPIRIN 81 MG PO CHEW
324.0000 mg | CHEWABLE_TABLET | Freq: Once | ORAL | Status: AC
  Administered 2017-01-26: 324 mg via ORAL
  Filled 2017-01-25: qty 4

## 2017-01-25 MED ORDER — SODIUM CHLORIDE 0.9 % IV BOLUS (SEPSIS)
1000.0000 mL | Freq: Once | INTRAVENOUS | Status: AC
  Administered 2017-01-26: 1000 mL via INTRAVENOUS

## 2017-01-25 NOTE — ED Triage Notes (Addendum)
Patient c/o left chest pain, denies radiation beginning yesterday. Pt reports 2 emeses yesterday with continuing nausea today. Pt c/o headache, weakness and intermittent SOB.

## 2017-01-25 NOTE — ED Provider Notes (Signed)
[facility] Emergency Department Provider Note   ____________________________________________   First MD Initiated Contact with Patient 01/25/17 2322     (approximate)  I have reviewed the triage vital signs and the nursing notes.   HISTORY  Chief Complaint Chest Pain    HPI Justin Keith is a 71 y.o. male who presents to the ED from home with a chief complaint of chest pain. Patient has a history of diabetes, hyperlipidemia, CAD status post CABG, medication noncompliance, who reports central chest tightness onset yesterday approximately 4 PM while at rest. Describes nonradiating chest tightness not associated with diaphoresis, shortness of breath, palpitations or dizziness. Patient reports 2 episodes of emesis the day prior. Also with mild frontal headache and generalized weakness. 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 Denies recent fever, chills, cough, congestion, abdominal pain, diarrhea. Denies recent travel or trauma. Did not take anything for the pain and currently is chest pain-free.   Past Medical History:  Diagnosis Date  . Diabetes mellitus without complication ([place])   . Hyperlipemia   . Hypertension   CAD  Patient Active Problem List   Diagnosis Date Noted  . Chest pain 12/07/2015    Past Surgical History:  Procedure Laterality Date  . CARDIAC SURGERY    . PROSTATE SURGERY    . SHOULDER ARTHROSCOPY      Prior to Admission medications   Medication Sig Start Date End Date Taking? Authorizing Provider  amLODipine (NORVASC) 10 MG tablet Take 1 tablet (10 mg total) by mouth daily. 12/08/15   Vaughan Basta, MD  aspirin 81 MG chewable tablet Chew 1 tablet (81 mg total) by mouth daily. 12/08/15   Vaughan Basta, MD  atorvastatin (LIPITOR) 80 MG tablet Take 1 tablet by mouth daily.    [provider]  lisinopril (PRINIVIL,ZESTRIL) 10 MG tablet Take 1 tablet (10 mg total) by mouth daily. 12/08/15   Vaughan Basta, MD  metFORMIN (GLUCOPHAGE) 500 MG tablet Take 1 tablet (500 mg total) by mouth 2 (two) times daily. 12/08/15   Vaughan Basta, MD  nitroGLYCERIN (NITROSTAT) 0.4 MG SL tablet Place 0.4 mg under the tongue every 5 (five) minutes as needed for chest pain.    [provider]  ranitidine (ZANTAC) 150 MG capsule Take 1 capsule by mouth 2 (two) times daily.    [provider]    Allergies Patient has no known allergies.  Family history Mother with CAD  Social History Social History  Substance Use Topics  . Smoking status: Never Smoker  . Smokeless tobacco: Current User  . Alcohol use No    Review of Systems  Constitutional: No fever/chills. Eyes: No visual changes. ENT: No sore throat. Cardiovascular: Positive for chest pain. Respiratory: Denies shortness of breath. Gastrointestinal: No abdominal pain.  No nausea, no vomiting.  No diarrhea.  No constipation. Genitourinary: Negative for dysuria. Musculoskeletal: Negative for back pain. Skin: Negative for rash. Neurological: Negative for headaches, focal weakness or numbness.   ____________________________________________   PHYSICAL EXAM:  VITAL SIGNS: ED Triage Vitals  Enc Vitals Group     BP 01/25/17 2244 130/66     Pulse Rate 01/25/17 2244 (!) 58     Resp 01/25/17 2244 12     Temp 01/25/17 2244 98 F (36.7 C)     Temp Source 01/25/17 2244 Oral     SpO2 01/25/17 2244 97 %     Weight 01/25/17 2247 235 lb (106.6 kg)     Height 01/25/17 2247 5'  8" (1.727 m)     Head Circumference --      Peak Flow --      Pain Score 01/25/17 2246 7     Pain Loc --      Pain Edu? --      Excl. in [place]? --     Constitutional: Alert and oriented. Well appearing and in no acute distress. Eyes: Conjunctivae are normal. PERRL. EOMI. Head: Atraumatic. Nose: No congestion/rhinnorhea. Mouth/Throat: Mucous membranes are moist.  Oropharynx non-erythematous. Neck: No stridor.  No carotid  bruits. Cardiovascular: Normal rate, regular rhythm. Grossly normal heart sounds.  Good peripheral circulation. Respiratory: Normal respiratory effort.  No retractions. Lungs CTAB. Gastrointestinal: Soft and minimally tender to palpation epigastrium without rebound or guarding. No distention. No abdominal bruits. No CVA tenderness. Musculoskeletal: No lower extremity tenderness nor edema.  No joint effusions. Neurologic:  Normal speech and language. No gross focal neurologic deficits are appreciated. No gait instability. Skin:  Skin is warm, dry and intact. No rash noted. Psychiatric: Mood and affect are normal. Speech and behavior are normal.  ____________________________________________   LABS (all labs ordered are listed, but only abnormal results are displayed)  Labs Reviewed  BASIC METABOLIC PANEL - Abnormal; Notable for the following:       Result Value   Glucose, Bld 100 (*)    BUN 24 (*)    Creatinine, Ser 1.45 (*)    GFR calc non Af Amer 47 (*)    GFR calc Af Amer 55 (*)    All other components within normal limits  CBC - Abnormal; Notable for the following:    RBC 3.71 (*)    Hemoglobin 11.2 (*)    HCT 31.9 (*)    RDW 14.6 (*)    All other components within normal limits  TROPONIN I  HEPATIC FUNCTION PANEL  ETHANOL  LIPASE, BLOOD  CK  TROPONIN I   ____________________________________________  EKG  ED ECG REPORT I, Malerie Eakins J, the attending physician, personally viewed and interpreted this ECG.   Date: 01/26/2017  EKG Time: 2241  Rate: 60  Rhythm: normal EKG, normal sinus rhythm  Axis: normal  Intervals:first-degree A-V block   ST&T Change: nonspecific  ____________________________________________  RADIOLOGY  Dg Chest 2 View  Result Date: 01/25/2017 CLINICAL DATA:  Chest pain EXAM: CHEST  2 VIEW COMPARISON:  Chest radiograph 12/07/2015 FINDINGS: The heart size and mediastinal contours are within normal limits. Both lungs are clear. The visualized  skeletal structures are unremarkable. Status post median sternotomy with CABG markers. IMPRESSION: No active cardiopulmonary disease. Electronically Signed   By: Ulyses Jarred M.D.   On: 01/25/2017 23:15    ____________________________________________   PROCEDURES  Procedure(s) performed: None  Procedures  Critical Care performed: No  ____________________________________________   INITIAL IMPRESSION / ASSESSMENT AND PLAN / ED COURSE  Pertinent labs & imaging results that were available during my care of the patient were reviewed by me and considered in my medical decision making (see chart for details).  71 year old male with history of diabetes, hypertension, CAD who presents with 1 day history of chest pain. Currently chest pain-free without intervention. Laboratory results remarkable for mild renal insufficiency which is most likely secondary to dehydration. Initial EKG and troponin unremarkable. Will repeat timed troponin.  ----------------------------------------- 12:02 AM on 01/26/2017 -----------------------------------------   OBSERVATION CARE: This patient is being placed under observation care for the following reasons: Chest pain with repeat testing to rule out ischemia     Clinical Course as  of Jan 27 232  Mon Jan 26, 2017  0232 Patient eating and drinking without difficulty, visiting with his roommate.  [JS]    Clinical Course User Index [JS] Paulette Blanch, MD    ----------------------------------------- 2:33 AM on 01/26/2017 -----------------------------------------   END OF OBSERVATION STATUS: After an appropriate period of observation, this patient is being discharged due to the following reason(s):  2 sets of negative troponins, low clinical suspicion for ACS, PE, dissection. Updated patient of repeat negative troponin. Strict return precautions given. Patient verbalizes understanding and agrees with plan of  care. ____________________________________________   FINAL CLINICAL IMPRESSION(S) / ED DIAGNOSES  Final diagnoses:  Chest pain, unspecified type  Dehydration      NEW MEDICATIONS STARTED DURING THIS VISIT:  New Prescriptions   No medications on file     Note:  This document was prepared using Dragon voice recognition software and may include unintentional dictation errors.    Paulette Blanch, MD 01/26/17 [phone]

## 2017-01-26 LAB — LIPASE, BLOOD: LIPASE: 29 U/L (ref 11–51)

## 2017-01-26 LAB — HEPATIC FUNCTION PANEL
ALBUMIN: 3.6 g/dL (ref 3.5–5.0)
ALK PHOS: 56 U/L (ref 38–126)
ALT: 15 U/L — ABNORMAL LOW (ref 17–63)
AST: 19 U/L (ref 15–41)
BILIRUBIN TOTAL: 0.4 mg/dL (ref 0.3–1.2)
Total Protein: 7 g/dL (ref 6.5–8.1)

## 2017-01-26 LAB — TROPONIN I: Troponin I: <0.03 ng/mL (ref <0.03)

## 2017-01-26 LAB — CK: Total CK: 193 U/L (ref 49–397)

## 2017-01-26 LAB — ETHANOL

## 2017-01-26 NOTE — Discharge Instructions (Signed)
Drink plenty of fluids daily.  Return to the ER for worsening symptoms, persistent vomiting, difficulty breathing or other concerns. °

## 2017-07-25 ENCOUNTER — Other Ambulatory Visit: Payer: Self-pay

## 2017-07-25 ENCOUNTER — Emergency Department: Payer: Medicare HMO

## 2017-07-25 ENCOUNTER — Emergency Department
Admission: EM | Admit: 2017-07-25 | Discharge: 2017-07-26 | Disposition: A | Discharge status: Home / Self Care | Payer: Medicare HMO | Attending: Emergency Medicine | Admitting: Emergency Medicine

## 2017-07-25 DIAGNOSIS — Z79899 Other long term (current) drug therapy: Secondary | ICD-10-CM | POA: Insufficient documentation

## 2017-07-25 DIAGNOSIS — Z951 Presence of aortocoronary bypass graft: Secondary | ICD-10-CM | POA: Insufficient documentation

## 2017-07-25 DIAGNOSIS — I2510 Atherosclerotic heart disease of native coronary artery without angina pectoris: Secondary | ICD-10-CM | POA: Insufficient documentation

## 2017-07-25 DIAGNOSIS — E119 Type 2 diabetes mellitus without complications: Secondary | ICD-10-CM | POA: Diagnosis not present

## 2017-07-25 DIAGNOSIS — I10 Essential (primary) hypertension: Secondary | ICD-10-CM | POA: Diagnosis not present

## 2017-07-25 DIAGNOSIS — Z7982 Long term (current) use of aspirin: Secondary | ICD-10-CM | POA: Diagnosis not present

## 2017-07-25 DIAGNOSIS — R079 Chest pain, unspecified: Secondary | ICD-10-CM | POA: Diagnosis not present

## 2017-07-25 DIAGNOSIS — Z7984 Long term (current) use of oral hypoglycemic drugs: Secondary | ICD-10-CM | POA: Insufficient documentation

## 2017-07-25 DIAGNOSIS — F17228 Nicotine dependence, chewing tobacco, with other nicotine-induced disorders: Secondary | ICD-10-CM | POA: Diagnosis not present

## 2017-07-25 LAB — CBC
HCT: 35 % — ABNORMAL LOW (ref 40.0–52.0)
HEMOGLOBIN: 11.5 g/dL — AB (ref 13.0–18.0)
MCH: 29 pg (ref 26.0–34.0)
MCHC: 32.9 g/dL (ref 32.0–36.0)
MCV: 88.1 fL (ref 80.0–100.0)
Platelets: 315 10*3/uL (ref 150–440)
RBC: 3.97 MIL/uL — AB (ref 4.40–5.90)
RDW: 14.6 % — ABNORMAL HIGH (ref 11.5–14.5)
WBC: 9.5 10*3/uL (ref 3.8–10.6)

## 2017-07-25 LAB — BASIC METABOLIC PANEL
ANION GAP: 9 (ref 5–15)
BUN: 25 mg/dL — ABNORMAL HIGH (ref 6–20)
CHLORIDE: 106 mmol/L (ref 101–111)
CO2: 22 mmol/L (ref 22–32)
Calcium: 9.4 mg/dL (ref 8.9–10.3)
Creatinine, Ser: 1.21 mg/dL (ref 0.61–1.24)
GFR calc non Af Amer: 58 mL/min — ABNORMAL LOW (ref >60)
Glucose, Bld: 148 mg/dL — ABNORMAL HIGH (ref 65–99)
Potassium: 4.3 mmol/L (ref 3.5–5.1)
SODIUM: 137 mmol/L (ref 135–145)

## 2017-07-25 LAB — TROPONIN I

## 2017-07-25 NOTE — ED Triage Notes (Signed)
Patient to rm 2 via EMS from home for chest pain off/on for 2 days after helping someone move "stuff".  VS:  HR 84; BP 174/82; pulse oxi 100% on room air; cbg 184; 12-lead normal sinus.  EMS gave aspirin 324mg  and placed ngt past 1 1/2 inch to left chest.

## 2017-07-25 NOTE — ED Notes (Signed)
Patient to xray via stretcher

## 2017-07-26 LAB — CK: CK TOTAL: 226 U/L (ref 49–397)

## 2017-07-26 LAB — TROPONIN I

## 2017-07-26 NOTE — Discharge Instructions (Signed)
Continue your medicines as directed by your doctor.  Please call your heart doctor for an appointment early next week.  Return to the ER for worsening symptoms, persistent vomiting, difficulty breathing or other concerns.

## 2017-07-26 NOTE — ED Provider Notes (Signed)
[facility] Emergency Department Provider Note   ____________________________________________   First MD Initiated Contact with Patient 07/26/17 0040     (approximate)  I have reviewed the triage vital signs and the nursing notes.   HISTORY  Chief Complaint Chest Pain    HPI Justin Keith is a 72 y.o. male brought to the ED from home via EMS with a chief complaint of chest pain.  Patient has a history of CAD status post CABG, hypertension, diabetes who helped to move furniture on Monday.  The following day he began to experience waxing/waning left-sided chest discomfort worsened with moving.  Presents tonight to be checked out.  Denies associated diaphoresis, shortness of breath, nausea/vomiting, palpitations or dizziness.  Denies recent fever, chills, cough, abdominal pain, diarrhea.  Denies recent travel or trauma.  EMS gave aspirin and nitroglycerin paste en route to the ED.   Past Medical History:  Diagnosis Date  . Diabetes mellitus without complication ([place])   . Hyperlipemia   . Hypertension     Patient Active Problem List   Diagnosis Date Noted  . Chest pain 12/07/2015    Past Surgical History:  Procedure Laterality Date  . CARDIAC SURGERY    . PROSTATE SURGERY    . SHOULDER ARTHROSCOPY      Prior to Admission medications   Medication Sig Start Date End Date Taking? Authorizing Provider  amLODipine (NORVASC) 10 MG tablet Take 1 tablet (10 mg total) by mouth daily. 12/08/15  Yes Vaughan Basta, MD  aspirin 81 MG chewable tablet Chew 1 tablet (81 mg total) by mouth daily. 12/08/15  Yes Vaughan Basta, MD  atorvastatin (LIPITOR) 80 MG tablet Take 1 tablet by mouth daily.   Yes [provider]  lisinopril (PRINIVIL,ZESTRIL) 10 MG tablet Take 1 tablet (10 mg total) by mouth daily. 12/08/15  Yes Vaughan Basta, MD  metFORMIN (GLUCOPHAGE-XR) 500 MG 24 hr tablet Take 500 mg by mouth 2 (two) times daily.    Yes  [provider]  nitroGLYCERIN (NITROSTAT) 0.4 MG SL tablet Place 0.4 mg under the tongue every 5 (five) minutes as needed for chest pain.   Yes [provider]  ranitidine (ZANTAC) 150 MG tablet Take 150 mg by mouth 2 (two) times daily.    Yes [provider]    Allergies Patient has no known allergies.  No family history on file.  Social History Social History   Tobacco Use  . Smoking status: Never Smoker  . Smokeless tobacco: Current User  Substance Use Topics  . Alcohol use: No  . Drug use: Not on file    Review of Systems  Constitutional: No fever/chills. Eyes: No visual changes. ENT: No sore throat. Cardiovascular: Positive for chest pain. Respiratory: Denies shortness of breath. Gastrointestinal: No abdominal pain.  No nausea, no vomiting.  No diarrhea.  No constipation. Genitourinary: Negative for dysuria. Musculoskeletal: Negative for back pain. Skin: Negative for rash. Neurological: Negative for headaches, focal weakness or numbness.   ____________________________________________   PHYSICAL EXAM:  VITAL SIGNS: ED Triage Vitals  Enc Vitals Group     BP 07/25/17 2320 139/81     Pulse Rate 07/25/17 2320 64     Resp 07/25/17 2320 16     Temp 07/25/17 2320 98.8 F (37.1 C)     Temp Source 07/25/17 2320 Oral     SpO2 07/25/17 2320 99 %     Weight 07/25/17 2319 240 lb (108.9 kg)     Height 07/25/17  2319 5\' 8"  (1.727 m)     Head Circumference --      Peak Flow --      Pain Score 07/25/17 2318 4     Pain Loc --      Pain Edu? --      Excl. in [place]? --     Constitutional: Alert and oriented. Well appearing and in no acute distress. Eyes: Conjunctivae are normal. PERRL. EOMI. Head: Atraumatic. Nose: No congestion/rhinnorhea. Mouth/Throat: Mucous membranes are moist.  Oropharynx non-erythematous. Neck: No stridor.   Cardiovascular: Normal rate, regular rhythm. Grossly normal heart sounds.  Good peripheral  circulation. Respiratory: Normal respiratory effort.  No retractions. Lungs CTAB. Gastrointestinal: Soft and nontender. No distention. No abdominal bruits. No CVA tenderness. Musculoskeletal: No lower extremity tenderness nor edema.  No joint effusions. Neurologic:  Normal speech and language. No gross focal neurologic deficits are appreciated. No gait instability. Skin:  Skin is warm, dry and intact. No rash noted. Psychiatric: Mood and affect are normal. Speech and behavior are normal.  ____________________________________________   LABS (all labs ordered are listed, but only abnormal results are displayed)  Labs Reviewed  BASIC METABOLIC PANEL - Abnormal; Notable for the following components:      Result Value   Glucose, Bld 148 (*)    BUN 25 (*)    GFR calc non Af Amer 58 (*)    All other components within normal limits  CBC - Abnormal; Notable for the following components:   RBC 3.97 (*)    Hemoglobin 11.5 (*)    HCT 35.0 (*)    RDW 14.6 (*)    All other components within normal limits  TROPONIN I  TROPONIN I  CK   ____________________________________________  EKG  ED ECG REPORT I, Patterson Hollenbaugh J, the attending physician, personally viewed and interpreted this ECG.   Date: 07/26/2017  EKG Time: 2326  Rate: 63  Rhythm: normal EKG, normal sinus rhythm  Axis: Normal  Intervals:none  ST&T Change: Nonspecific No significant change from 01/25/2017 ____________________________________________  RADIOLOGY  ED MD interpretation: No acute cardiopulmonary process  Official radiology report(s): Dg Chest 2 View  Result Date: 07/26/2017 CLINICAL DATA:  Chest pain EXAM: CHEST  2 VIEW COMPARISON:  01/25/2017 FINDINGS: Post sternotomy changes. No focal pulmonary infiltrate or effusion. Stable cardiomediastinal silhouette. No pneumothorax. Postsurgical changes in the right humeral head. IMPRESSION: No active cardiopulmonary disease. Electronically Signed   By: Donavan Foil M.D.    On: 07/26/2017 00:10    ____________________________________________   PROCEDURES  Procedure(s) performed: None  Procedures  Critical Care performed: No  ____________________________________________   INITIAL IMPRESSION / ASSESSMENT AND PLAN / ED COURSE  As part of my medical decision making, I reviewed the following data within the electronic MEDICAL RECORD NUMBER History obtained from family, Nursing notes reviewed and incorporated, Labs reviewed, EKG interpreted, Old EKG reviewed, Old chart reviewed, Radiograph reviewed and Notes from prior ED visits.   72 year old male with CAD, diabetes, hypertension who presents with chest pain after moving furniture. Differential diagnosis includes, but is not limited to, ACS, aortic dissection, pulmonary embolism, cardiac tamponade, pneumothorax, pneumonia, pericarditis, myocarditis, GI-related causes including esophagitis/gastritis, and musculoskeletal chest wall pain.    Patient is currently pain-free.  Initial EKG and troponin are unremarkable.  Will repeat timed troponin; will also check CK.  Clinical Course as of Jul 27 327  Nancy Fetter Jul 26, 2017  0326 Updated patient of repeat negative troponin and unremarkable CK.  Voices no complaints of chest pain  at this time.  Patient will follow-up with his cardiologist next week.  Strict return precautions given.  Patient verbalizes understanding and agrees with plan of care.  [JS]    Clinical Course User Index [JS] Paulette Blanch, MD     ____________________________________________   FINAL CLINICAL IMPRESSION(S) / ED DIAGNOSES  Final diagnoses:  Nonspecific chest pain     ED Discharge Orders    None       Note:  This document was prepared using Dragon voice recognition software and may include unintentional dictation errors.    Paulette Blanch, MD 07/26/17 [phone]

## 2017-08-07 ENCOUNTER — Ambulatory Visit
Admission: RE | Admit: 2017-08-07 | Discharge: 2017-08-07 | Disposition: A | Discharge status: Home / Self Care | Payer: Medicare HMO | Source: Ambulatory Visit | Attending: Internal Medicine | Admitting: Internal Medicine

## 2017-08-07 ENCOUNTER — Encounter: Payer: Self-pay | Admitting: Internal Medicine

## 2017-08-07 ENCOUNTER — Encounter
Admission: RE | Disposition: A | Discharge status: Home / Self Care | Payer: Self-pay | Source: Ambulatory Visit | Attending: Internal Medicine

## 2017-08-07 DIAGNOSIS — Z7982 Long term (current) use of aspirin: Secondary | ICD-10-CM | POA: Diagnosis not present

## 2017-08-07 DIAGNOSIS — Z955 Presence of coronary angioplasty implant and graft: Secondary | ICD-10-CM | POA: Diagnosis not present

## 2017-08-07 DIAGNOSIS — R011 Cardiac murmur, unspecified: Secondary | ICD-10-CM | POA: Diagnosis not present

## 2017-08-07 DIAGNOSIS — F17200 Nicotine dependence, unspecified, uncomplicated: Secondary | ICD-10-CM | POA: Diagnosis not present

## 2017-08-07 DIAGNOSIS — Z951 Presence of aortocoronary bypass graft: Secondary | ICD-10-CM | POA: Diagnosis not present

## 2017-08-07 DIAGNOSIS — Z7984 Long term (current) use of oral hypoglycemic drugs: Secondary | ICD-10-CM | POA: Diagnosis not present

## 2017-08-07 DIAGNOSIS — N189 Chronic kidney disease, unspecified: Secondary | ICD-10-CM | POA: Insufficient documentation

## 2017-08-07 DIAGNOSIS — F79 Unspecified intellectual disabilities: Secondary | ICD-10-CM | POA: Diagnosis not present

## 2017-08-07 DIAGNOSIS — I429 Cardiomyopathy, unspecified: Secondary | ICD-10-CM | POA: Diagnosis not present

## 2017-08-07 DIAGNOSIS — E669 Obesity, unspecified: Secondary | ICD-10-CM | POA: Insufficient documentation

## 2017-08-07 DIAGNOSIS — I25110 Atherosclerotic heart disease of native coronary artery with unstable angina pectoris: Secondary | ICD-10-CM | POA: Diagnosis not present

## 2017-08-07 DIAGNOSIS — I200 Unstable angina: Secondary | ICD-10-CM

## 2017-08-07 DIAGNOSIS — K219 Gastro-esophageal reflux disease without esophagitis: Secondary | ICD-10-CM | POA: Diagnosis not present

## 2017-08-07 DIAGNOSIS — I2582 Chronic total occlusion of coronary artery: Secondary | ICD-10-CM | POA: Diagnosis not present

## 2017-08-07 HISTORY — PX: LEFT HEART CATH AND CORS/GRAFTS ANGIOGRAPHY: CATH118250

## 2017-08-07 LAB — CARDIAC CATHETERIZATION: Cath EF Quantitative: 45 %

## 2017-08-07 LAB — GLUCOSE, CAPILLARY: Glucose-Capillary: 81 mg/dL (ref 65–99)

## 2017-08-07 SURGERY — LEFT HEART CATH AND CORS/GRAFTS ANGIOGRAPHY
Anesthesia: Moderate Sedation

## 2017-08-07 MED ORDER — HEPARIN (PORCINE) IN NACL 2-0.9 UNIT/ML-% IJ SOLN
INTRAMUSCULAR | Status: AC
  Filled 2017-08-07: qty 500

## 2017-08-07 MED ORDER — SODIUM CHLORIDE 0.9 % WEIGHT BASED INFUSION
3.0000 mL/kg/h | INTRAVENOUS | Status: AC
  Administered 2017-08-07: 3 mL/kg/h via INTRAVENOUS

## 2017-08-07 MED ORDER — FENTANYL CITRATE (PF) 100 MCG/2ML IJ SOLN
INTRAMUSCULAR | Status: DC | PRN
  Administered 2017-08-07: 25 ug via INTRAVENOUS

## 2017-08-07 MED ORDER — ONDANSETRON HCL 4 MG/2ML IJ SOLN: 4.0000 mg | Freq: Four times a day (QID) | INTRAMUSCULAR | Status: DC | PRN

## 2017-08-07 MED ORDER — SODIUM CHLORIDE 0.9 % IV SOLN: 250.0000 mL | INTRAVENOUS | Status: DC | PRN

## 2017-08-07 MED ORDER — MIDAZOLAM HCL 2 MG/2ML IJ SOLN
INTRAMUSCULAR | Status: AC
  Filled 2017-08-07: qty 2

## 2017-08-07 MED ORDER — SODIUM CHLORIDE 0.9 % WEIGHT BASED INFUSION: 1.0000 mL/kg/h | INTRAVENOUS | Status: DC

## 2017-08-07 MED ORDER — MIDAZOLAM HCL 2 MG/2ML IJ SOLN
INTRAMUSCULAR | Status: DC | PRN
Start: 2017-08-07 — End: 2017-08-07
  Administered 2017-08-07: 1 mg via INTRAVENOUS

## 2017-08-07 MED ORDER — IOPAMIDOL (ISOVUE-300) INJECTION 61%
INTRAVENOUS | Status: DC | PRN
Start: 2017-08-07 — End: 2017-08-07
  Administered 2017-08-07: 180 mL via INTRA_ARTERIAL

## 2017-08-07 MED ORDER — ASPIRIN 81 MG PO CHEW: 81.0000 mg | CHEWABLE_TABLET | ORAL | Status: DC

## 2017-08-07 MED ORDER — SODIUM CHLORIDE 0.9% FLUSH: 3.0000 mL | INTRAVENOUS | Status: DC | PRN

## 2017-08-07 MED ORDER — FENTANYL CITRATE (PF) 100 MCG/2ML IJ SOLN
INTRAMUSCULAR | Status: AC
  Filled 2017-08-07: qty 2

## 2017-08-07 MED ORDER — ACETAMINOPHEN 325 MG PO TABS: 650.0000 mg | ORAL_TABLET | ORAL | Status: DC | PRN

## 2017-08-07 MED ORDER — SODIUM CHLORIDE 0.9% FLUSH: 3.0000 mL | Freq: Two times a day (BID) | INTRAVENOUS | Status: DC

## 2017-08-07 SURGICAL SUPPLY — 10 items
CATH INFINITI 5 FR MPA2 (CATHETERS) ×3 IMPLANT
CATH INFINITI 5FR ANG PIGTAIL (CATHETERS) ×3 IMPLANT
CATH INFINITI 5FR JL4 (CATHETERS) ×3 IMPLANT
CATH INFINITI JR4 5F (CATHETERS) ×3 IMPLANT
DEVICE CLOSURE MYNXGRIP 5F (Vascular Products) ×3 IMPLANT
KIT MANI 3VAL PERCEP (MISCELLANEOUS) ×3 IMPLANT
NEEDLE PERC 18GX7CM (NEEDLE) ×3 IMPLANT
PACK CARDIAC CATH (CUSTOM PROCEDURE TRAY) ×3 IMPLANT
SHEATH AVANTI 5FR X 11CM (SHEATH) ×3 IMPLANT
WIRE GUIDERIGHT .035X150 (WIRE) ×3 IMPLANT

## 2017-08-07 NOTE — Discharge Instructions (Signed)

## 2017-08-07 NOTE — Progress Notes (Signed)
Patient clinically stable post heart cath. Brother at bedside. Denies complaints. No bleeding nor hematoma at right groin site. Discharge instructions given with questions answered.

## 2018-02-12 ENCOUNTER — Other Ambulatory Visit: Payer: Self-pay

## 2018-02-12 DIAGNOSIS — Z1211 Encounter for screening for malignant neoplasm of colon: Secondary | ICD-10-CM

## 2018-02-12 MED ORDER — NA SULFATE-K SULFATE-MG SULF 17.5-3.13-1.6 GM/177ML PO SOLN: 1.0000 | Freq: Once | ORAL | 0 refills | Status: AC

## 2018-02-25 ENCOUNTER — Ambulatory Visit
Admission: RE | Admit: 2018-02-25 | Discharge: 2018-02-25 | Disposition: A | Discharge status: Home / Self Care | Payer: Medicare HMO | Source: Ambulatory Visit | Attending: Gastroenterology | Admitting: Gastroenterology

## 2018-02-25 ENCOUNTER — Ambulatory Visit: Payer: Medicare HMO | Admitting: Anesthesiology

## 2018-02-25 ENCOUNTER — Encounter
Admission: RE | Disposition: A | Discharge status: Home / Self Care | Payer: Self-pay | Source: Ambulatory Visit | Attending: Gastroenterology

## 2018-02-25 ENCOUNTER — Encounter: Payer: Self-pay | Admitting: *Deleted

## 2018-02-25 ENCOUNTER — Other Ambulatory Visit: Payer: Self-pay

## 2018-02-25 DIAGNOSIS — Z538 Procedure and treatment not carried out for other reasons: Secondary | ICD-10-CM | POA: Insufficient documentation

## 2018-02-25 DIAGNOSIS — E785 Hyperlipidemia, unspecified: Secondary | ICD-10-CM | POA: Diagnosis not present

## 2018-02-25 DIAGNOSIS — I10 Essential (primary) hypertension: Secondary | ICD-10-CM | POA: Diagnosis not present

## 2018-02-25 DIAGNOSIS — E119 Type 2 diabetes mellitus without complications: Secondary | ICD-10-CM | POA: Insufficient documentation

## 2018-02-25 DIAGNOSIS — Z1211 Encounter for screening for malignant neoplasm of colon: Secondary | ICD-10-CM | POA: Insufficient documentation

## 2018-02-25 HISTORY — PX: COLONOSCOPY WITH PROPOFOL: SHX5780

## 2018-02-25 LAB — GLUCOSE, CAPILLARY: GLUCOSE-CAPILLARY: 91 mg/dL (ref 70–99)

## 2018-02-25 SURGERY — COLONOSCOPY WITH PROPOFOL
Anesthesia: General

## 2018-02-25 MED ORDER — LIDOCAINE HCL (PF) 2 % IJ SOLN
INTRAMUSCULAR | Status: AC
  Filled 2018-02-25: qty 10

## 2018-02-25 MED ORDER — PROPOFOL 10 MG/ML IV BOLUS
INTRAVENOUS | Status: DC | PRN
  Administered 2018-02-25: 80 mg via INTRAVENOUS

## 2018-02-25 MED ORDER — SODIUM CHLORIDE 0.9 % IV SOLN
INTRAVENOUS | Status: DC
  Administered 2018-02-25: 08:00:00 via INTRAVENOUS

## 2018-02-25 MED ORDER — PEG 3350-KCL-NA BICARB-NACL 420 G PO SOLR: 4000.0000 mL | Freq: Once | ORAL | 0 refills | Status: AC

## 2018-02-25 MED ORDER — PROPOFOL 500 MG/50ML IV EMUL
INTRAVENOUS | Status: AC
  Filled 2018-02-25: qty 50

## 2018-02-25 MED ORDER — GLYCOPYRROLATE 0.2 MG/ML IJ SOLN
INTRAMUSCULAR | Status: AC
  Filled 2018-02-25: qty 1

## 2018-02-25 MED ORDER — PROPOFOL 10 MG/ML IV BOLUS
INTRAVENOUS | Status: AC
  Filled 2018-02-25: qty 20

## 2018-02-25 MED ORDER — LIDOCAINE HCL (CARDIAC) PF 100 MG/5ML IV SOSY
PREFILLED_SYRINGE | INTRAVENOUS | Status: DC | PRN
  Administered 2018-02-25: 100 mg via INTRAVENOUS

## 2018-02-25 MED ORDER — PROPOFOL 500 MG/50ML IV EMUL
INTRAVENOUS | Status: DC | PRN
  Administered 2018-02-25: 125 ug/kg/min via INTRAVENOUS

## 2018-02-25 NOTE — Anesthesia Preprocedure Evaluation (Signed)
Anesthesia Evaluation  Patient identified by MRN, date of birth, ID band Patient awake    Reviewed: Allergy & Precautions, H&P , NPO status , reviewed documented beta blocker date and time   Airway Mallampati: III  TM Distance: >3 FB Neck ROM: limited    Dental  (+) Poor Dentition, Chipped, Missing   Pulmonary    Pulmonary exam normal        Cardiovascular hypertension, Normal cardiovascular exam  Cardiac Cath 07/2017 Conclusion Mildly reduced overall left ventricular function ejection fraction around 45% Severe multivessel coronary disease 100% proximal LAD, occluded OM 1 and 2, occluded ostial RCA Patent  circumflex with diffuse 50% LIMA to mid LAD widely patent Vein graft to OM1 widely patent Vein graft to distal RCA widely patent Recommend medical therapy patient adequately revascularized   Neuro/Psych    GI/Hepatic   Endo/Other  diabetes  Renal/GU      Musculoskeletal   Abdominal   Peds  Hematology   Anesthesia Other Findings Past Medical History: No date: Diabetes mellitus without complication ([place]) No date: Hyperlipemia No date: Hypertension  Past Surgical History: No date: CARDIAC SURGERY 08/07/2017: LEFT HEART CATH AND CORS/GRAFTS ANGIOGRAPHY; N/A     Comment:  Procedure: LEFT HEART CATH AND CORS/GRAFTS ANGIOGRAPHY;               Surgeon: Yolonda Kida, MD;  Location: [place]              CV LAB;  Service: Cardiovascular;  Laterality: N/A; No date: PROSTATE SURGERY No date: SHOULDER ARTHROSCOPY  BMI    Body Mass Index:  34.36 kg/m      Reproductive/Obstetrics                             Anesthesia Physical Anesthesia Plan  ASA: III  Anesthesia Plan: General   Post-op Pain Management:    Induction: Intravenous  PONV Risk Score and Plan: Treatment may vary due to age or medical condition and TIVA  Airway Management Planned: Nasal Cannula and Natural  Airway  Additional Equipment:   Intra-op Plan:   Post-operative Plan:   Informed Consent: I have reviewed the patients History and Physical, chart, labs and discussed the procedure including the risks, benefits and alternatives for the proposed anesthesia with the patient or authorized representative who has indicated his/her understanding and acceptance.   Dental Advisory Given  Plan Discussed with:   Anesthesia Plan Comments:         Anesthesia Quick Evaluation

## 2018-02-25 NOTE — Transfer of Care (Signed)
Immediate Anesthesia Transfer of Care Note  Patient: Justin Keith  Procedure(s) Performed: COLONOSCOPY WITH PROPOFOL (N/A )  Patient Location: PACU and Endoscopy Unit  Anesthesia Type:General  Level of Consciousness: oriented  Airway & Oxygen Therapy: Patient Spontanous Breathing  Post-op Assessment: Report given to RN  Post vital signs: stable  Last Vitals:  Vitals Value Taken Time  BP 126/105 02/25/2018  8:45 AM  Temp 36.1 C 02/25/2018  8:38 AM  Pulse 54 02/25/2018  8:45 AM  Resp 18 02/25/2018  8:45 AM  SpO2 98 % 02/25/2018  8:45 AM  Vitals shown include unvalidated device data.  Last Pain:  Vitals:   02/25/18 0838  TempSrc: Tympanic  PainSc: 0-No pain         Complications: No apparent anesthesia complications

## 2018-02-25 NOTE — Op Note (Signed)
[facility] Gastroenterology Patient Name: Justin Keith Procedure Date: 02/25/2018 8:23 AM MRN: [id number] Account #: [id number] Date of Birth: May 19, 1946 Admit Type: Outpatient Age: 72 Room: [facility] ENDO ROOM 4 Gender: Male Note Status: Finalized Procedure:            Colonoscopy Indications:          Screening for colorectal malignant neoplasm Providers:            Jonathon Bellows MD, MD Referring MD:         No Local Md, MD (Referring MD) Medicines:            Monitored Anesthesia Care Complications:        No immediate complications. Procedure:            Pre-Anesthesia Assessment:                       - Prior to the procedure, a History and Physical was                        performed, and patient medications, allergies and                        sensitivities were reviewed. The patient's tolerance of                        previous anesthesia was reviewed.                       - ASA Grade Assessment: II - A patient with mild                        systemic disease.                       After obtaining informed consent, the colonoscope was                        passed under direct vision. Throughout the procedure,                        the patient's blood pressure, pulse, and oxygen                        saturations were monitored continuously. The                        Colonoscope was introduced through the anus with the                        intention of advancing to the cecum. The scope was                        advanced to the transverse colon before the procedure                        was aborted. Medications were given. The colonoscopy                        was performed with ease. The patient tolerated the  procedure well. The quality of the bowel preparation                        was poor. Findings:      The perianal and digital rectal examinations were normal.      Stool was found in the entire colon, precluding  visualization. Impression:           - Preparation of the colon was poor.                       - Stool in the entire examined colon.                       - No specimens collected. Recommendation:       - Discharge patient to home (with escort).                       - Resume previous diet.                       - Continue present medications.                       - Repeat colonoscopy in 1 week because the bowel                        preparation was suboptimal. Procedure Code(s):    --- Professional ---                       [phone], 53, Colonoscopy, flexible; diagnostic, including                        collection of specimen(s) by brushing or washing, when                        performed (separate procedure) Diagnosis Code(s):    --- Professional ---                       Z12.11, Encounter for screening for malignant neoplasm                        of colon CPT copyright 2017 American Medical Association. All rights reserved. The codes documented in this report are preliminary and upon coder review may  be revised to meet current compliance requirements. Jonathon Bellows, MD Jonathon Bellows MD, MD 02/25/2018 8:32:46 AM This report has been signed electronically. Number of Addenda: 0 Note Initiated On: 02/25/2018 8:23 AM Total Procedure Duration: 0 hours 1 minute 3 seconds       [facility]

## 2018-02-25 NOTE — Anesthesia Post-op Follow-up Note (Signed)
Anesthesia QCDR form completed.        

## 2018-02-25 NOTE — Anesthesia Postprocedure Evaluation (Signed)
Anesthesia Post Note  Patient: Justin Keith  Procedure(s) Performed: COLONOSCOPY WITH PROPOFOL (N/A )  Patient location during evaluation: Endoscopy Anesthesia Type: General Level of consciousness: awake and alert Pain management: pain level controlled Vital Signs Assessment: post-procedure vital signs reviewed and stable Respiratory status: spontaneous breathing, nonlabored ventilation and respiratory function stable Cardiovascular status: blood pressure returned to baseline and stable Postop Assessment: no apparent nausea or vomiting Anesthetic complications: no     Last Vitals:  Vitals:   02/25/18 0838 02/25/18 0855  BP: (!) 126/105 99/62  Pulse: (!) 55   Resp: 14   Temp: (!) 36.1 C   SpO2: 100%     Last Pain:  Vitals:   02/25/18 0905  TempSrc:   PainSc: 0-No pain                 Alphonsus Sias

## 2018-02-26 ENCOUNTER — Ambulatory Visit: Payer: Medicare HMO | Admitting: Anesthesiology

## 2018-02-26 ENCOUNTER — Encounter
Admission: RE | Disposition: A | Discharge status: Home / Self Care | Payer: Self-pay | Source: Ambulatory Visit | Attending: Gastroenterology

## 2018-02-26 ENCOUNTER — Encounter: Payer: Self-pay | Admitting: Anesthesiology

## 2018-02-26 ENCOUNTER — Ambulatory Visit
Admission: RE | Admit: 2018-02-26 | Discharge: 2018-02-26 | Disposition: A | Discharge status: Home / Self Care | Payer: Medicare HMO | Source: Ambulatory Visit | Attending: Gastroenterology | Admitting: Gastroenterology

## 2018-02-26 DIAGNOSIS — E785 Hyperlipidemia, unspecified: Secondary | ICD-10-CM | POA: Insufficient documentation

## 2018-02-26 DIAGNOSIS — Z7982 Long term (current) use of aspirin: Secondary | ICD-10-CM | POA: Diagnosis not present

## 2018-02-26 DIAGNOSIS — Z79899 Other long term (current) drug therapy: Secondary | ICD-10-CM | POA: Insufficient documentation

## 2018-02-26 DIAGNOSIS — D123 Benign neoplasm of transverse colon: Secondary | ICD-10-CM

## 2018-02-26 DIAGNOSIS — Z951 Presence of aortocoronary bypass graft: Secondary | ICD-10-CM | POA: Diagnosis not present

## 2018-02-26 DIAGNOSIS — Z1211 Encounter for screening for malignant neoplasm of colon: Secondary | ICD-10-CM

## 2018-02-26 DIAGNOSIS — I10 Essential (primary) hypertension: Secondary | ICD-10-CM | POA: Insufficient documentation

## 2018-02-26 DIAGNOSIS — E119 Type 2 diabetes mellitus without complications: Secondary | ICD-10-CM | POA: Insufficient documentation

## 2018-02-26 DIAGNOSIS — K621 Rectal polyp: Secondary | ICD-10-CM

## 2018-02-26 DIAGNOSIS — Z7984 Long term (current) use of oral hypoglycemic drugs: Secondary | ICD-10-CM | POA: Insufficient documentation

## 2018-02-26 HISTORY — PX: COLONOSCOPY WITH PROPOFOL: SHX5780

## 2018-02-26 LAB — GLUCOSE, CAPILLARY: Glucose-Capillary: 84 mg/dL (ref 70–99)

## 2018-02-26 SURGERY — COLONOSCOPY WITH PROPOFOL
Anesthesia: General

## 2018-02-26 MED ORDER — PROPOFOL 10 MG/ML IV BOLUS
INTRAVENOUS | Status: DC | PRN
  Administered 2018-02-26: 20 mg via INTRAVENOUS

## 2018-02-26 MED ORDER — PROPOFOL 500 MG/50ML IV EMUL
INTRAVENOUS | Status: DC | PRN
  Administered 2018-02-26: 50 ug/kg/min via INTRAVENOUS

## 2018-02-26 MED ORDER — DEXTROSE-NACL 5-0.45 % IV SOLN
INTRAVENOUS | Status: DC
  Administered 2018-02-26: 13:00:00 via INTRAVENOUS

## 2018-02-26 MED ORDER — FENTANYL CITRATE (PF) 100 MCG/2ML IJ SOLN
INTRAMUSCULAR | Status: AC
  Filled 2018-02-26: qty 2

## 2018-02-26 MED ORDER — MIDAZOLAM HCL 5 MG/5ML IJ SOLN
INTRAMUSCULAR | Status: DC | PRN
  Administered 2018-02-26: 2 mg via INTRAVENOUS

## 2018-02-26 MED ORDER — FENTANYL CITRATE (PF) 100 MCG/2ML IJ SOLN
INTRAMUSCULAR | Status: DC | PRN
  Administered 2018-02-26: 50 ug via INTRAVENOUS
  Administered 2018-02-26 (×2): 25 ug via INTRAVENOUS

## 2018-02-26 MED ORDER — SODIUM CHLORIDE 0.9 % IV SOLN: INTRAVENOUS | Status: DC

## 2018-02-26 MED ORDER — LIDOCAINE HCL (PF) 2 % IJ SOLN
INTRAMUSCULAR | Status: DC | PRN
  Administered 2018-02-26: 100 mg

## 2018-02-26 MED ORDER — MIDAZOLAM HCL 2 MG/2ML IJ SOLN
INTRAMUSCULAR | Status: AC
  Filled 2018-02-26: qty 2

## 2018-02-26 MED ORDER — DEXTROSE-NACL 5-0.9 % IV SOLN: INTRAVENOUS | Status: DC

## 2018-02-26 NOTE — Anesthesia Preprocedure Evaluation (Signed)
Anesthesia Evaluation  Patient identified by MRN, date of birth, ID band Patient awake    Reviewed: Allergy & Precautions, NPO status , Patient's Chart, lab work & pertinent test results, reviewed documented beta blocker date and time   Airway Mallampati: III  TM Distance: >3 FB     Dental  (+) Chipped, Poor Dentition, Missing   Pulmonary           Cardiovascular hypertension, Pt. on medications + CABG       Neuro/Psych    GI/Hepatic   Endo/Other  diabetes, Type 2  Renal/GU      Musculoskeletal   Abdominal   Peds  Hematology   Anesthesia Other Findings Obese.EKG ok.  Reproductive/Obstetrics                             Anesthesia Physical Anesthesia Plan  ASA: III  Anesthesia Plan: General   Post-op Pain Management:    Induction: Intravenous  PONV Risk Score and Plan:   Airway Management Planned:   Additional Equipment:   Intra-op Plan:   Post-operative Plan:   Informed Consent: I have reviewed the patients History and Physical, chart, labs and discussed the procedure including the risks, benefits and alternatives for the proposed anesthesia with the patient or authorized representative who has indicated his/her understanding and acceptance.     Plan Discussed with: CRNA  Anesthesia Plan Comments:         Anesthesia Quick Evaluation

## 2018-02-26 NOTE — Anesthesia Postprocedure Evaluation (Signed)
Anesthesia Post Note  Patient: Justin Keith  Procedure(s) Performed: COLONOSCOPY WITH PROPOFOL (N/A )  Patient location during evaluation: Endoscopy Anesthesia Type: General Level of consciousness: awake and alert Pain management: pain level controlled Vital Signs Assessment: post-procedure vital signs reviewed and stable Respiratory status: spontaneous breathing, nonlabored ventilation, respiratory function stable and patient connected to nasal cannula oxygen Cardiovascular status: blood pressure returned to baseline and stable Postop Assessment: no apparent nausea or vomiting Anesthetic complications: no     Last Vitals:  Vitals:   02/26/18 1430 02/26/18 1436  BP:  139/77  Pulse: (!) 53 (!) 50  Resp: 12 16  Temp:    SpO2: 100% 98%    Last Pain:  Vitals:   02/26/18 1233  TempSrc: Oral  PainSc: 0-No pain                 Marti Mclane S

## 2018-02-26 NOTE — Transfer of Care (Signed)
Immediate Anesthesia Transfer of Care Note  Patient: Justin Keith  Procedure(s) Performed: COLONOSCOPY WITH PROPOFOL (N/A )  Patient Location: PACU  Anesthesia Type:General  Level of Consciousness: sedated  Airway & Oxygen Therapy: Patient Spontanous Breathing and Patient connected to nasal cannula oxygen  Post-op Assessment: Report given to RN and Post -op Vital signs reviewed and stable  Post vital signs: Reviewed and stable  Last Vitals:  Vitals Value Taken Time  BP 113/71 02/26/2018  2:15 PM  Temp 36.1 C 02/26/2018  2:15 PM  Pulse 50 02/26/2018  2:15 PM  Resp 19 02/26/2018  2:15 PM  SpO2 98 % 02/26/2018  2:15 PM    Last Pain:  Vitals:   02/26/18 1233  TempSrc: Oral  PainSc: 0-No pain         Complications: No apparent anesthesia complications

## 2018-02-26 NOTE — Op Note (Signed)
[facility] Gastroenterology Patient Name: Justin Keith Procedure Date: 02/26/2018 1:51 PM MRN: [id number] Account #: [id number] Date of Birth: 04-24-1946 Admit Type: Outpatient Age: 72 Room: [facility] ENDO ROOM 4 Gender: Male Note Status: Finalized Procedure:            Colonoscopy Indications:          Screening for colorectal malignant neoplasm Providers:            Jonathon Bellows MD, MD Referring MD:         No Local Md, MD (Referring MD) Medicines:            Monitored Anesthesia Care Complications:        No immediate complications. Procedure:            Pre-Anesthesia Assessment:                       - Prior to the procedure, a History and Physical was                        performed, and patient medications, allergies and                        sensitivities were reviewed. The patient's tolerance of                        previous anesthesia was reviewed.                       - The risks and benefits of the procedure and the                        sedation options and risks were discussed with the                        patient. All questions were answered and informed                        consent was obtained.                       - ASA Grade Assessment: II - A patient with mild                        systemic disease.                       After obtaining informed consent, the colonoscope was                        passed under direct vision. Throughout the procedure,                        the patient's blood pressure, pulse, and oxygen                        saturations were monitored continuously. The                        Colonoscope was introduced through the anus and  advanced to the the cecum, identified by the                        appendiceal orifice, IC valve and transillumination.                        The colonoscopy was performed with ease. The patient                        tolerated the procedure well. The quality  of the bowel                        preparation was good. Findings:      The perianal and digital rectal examinations were normal.      Two sessile polyps were found in the rectum and transverse colon. The       polyps were 5 to 7 mm in size. These polyps were removed with a cold       snare. Resection and retrieval were complete.      The exam was otherwise without abnormality on direct and retroflexion       views. Impression:           - Two 5 to 7 mm polyps in the rectum and in the                        transverse colon, removed with a cold snare. Resected                        and retrieved.                       - The examination was otherwise normal on direct and                        retroflexion views. Recommendation:       - Discharge patient to home (with escort).                       - Resume previous diet.                       - Continue present medications.                       - Await pathology results.                       - Repeat colonoscopy in 5-10 years for surveillance                        based on pathology results. Procedure Code(s):    --- Professional ---                       [phone], Colonoscopy, flexible; with removal of tumor(s),                        polyp(s), or other lesion(s) by snare technique Diagnosis Code(s):    --- Professional ---                       Z12.11, Encounter for screening for  malignant neoplasm                        of colon                       K62.1, Rectal polyp                       D12.3, Benign neoplasm of transverse colon (hepatic                        flexure or splenic flexure) CPT copyright 2017 American Medical Association. All rights reserved. The codes documented in this report are preliminary and upon coder review may  be revised to meet current compliance requirements. Jonathon Bellows, MD Jonathon Bellows MD, MD 02/26/2018 2:12:17 PM This report has been signed electronically. Number of Addenda: 0 Note Initiated On:  02/26/2018 1:51 PM Scope Withdrawal Time: 0 hours 9 minutes 46 seconds  Total Procedure Duration: 0 hours 11 minutes 18 seconds       [facility]

## 2018-02-26 NOTE — Anesthesia Post-op Follow-up Note (Signed)
Anesthesia QCDR form completed.        

## 2018-02-26 NOTE — H&P (Signed)
Jonathon Bellows, MD [address], [place], [place], [place], [address] [address], [place], [place], [place], [address] Phone: [phone]  Fax: [phone]  Primary Care Physician:  [facility]   Pre-Procedure History & Physical: HPI:  Justin Keith is a 72 y.o. male is here for an colonoscopy.   Past Medical History:  Diagnosis Date  . Diabetes mellitus without complication ([place])   . Hyperlipemia   . Hypertension     Past Surgical History:  Procedure Laterality Date  . CARDIAC SURGERY    . LEFT HEART CATH AND CORS/GRAFTS ANGIOGRAPHY N/A 08/07/2017   Procedure: LEFT HEART CATH AND CORS/GRAFTS ANGIOGRAPHY;  Surgeon: Yolonda Kida, MD;  Location: [facility];  Service: Cardiovascular;  Laterality: N/A;  . PROSTATE SURGERY    . SHOULDER ARTHROSCOPY      Prior to Admission medications   Medication Sig Start Date End Date Taking? Authorizing Provider  amLODipine (NORVASC) 10 MG tablet Take 1 tablet (10 mg total) by mouth daily. 12/08/15  Yes Vaughan Basta, MD  aspirin 81 MG chewable tablet Chew 1 tablet (81 mg total) by mouth daily. 12/08/15  Yes Vaughan Basta, MD  atorvastatin (LIPITOR) 80 MG tablet Take 1 tablet by mouth daily.   Yes [provider]  lisinopril (PRINIVIL,ZESTRIL) 10 MG tablet Take 1 tablet (10 mg total) by mouth daily. 12/08/15  Yes Vaughan Basta, MD  metFORMIN (GLUCOPHAGE-XR) 500 MG 24 hr tablet Take 500 mg by mouth 2 (two) times daily.    Yes [provider]  ranitidine (ZANTAC) 150 MG tablet Take 150 mg by mouth 2 (two) times daily.    Yes [provider]  nitroGLYCERIN (NITROSTAT) 0.4 MG SL tablet Place 0.4 mg under the tongue every 5 (five) minutes as needed for chest pain.    [provider]    Allergies as of 02/25/2018  . (No Known Allergies)    History reviewed. No pertinent family history.  Social History   Socioeconomic History  .  Marital status: Single    Spouse name: Not on file  . Number of children: Not on file  . Years of education: Not on file  . Highest education level: Not on file  Occupational History  . Not on file  Social Needs  . Financial resource strain: Not on file  . Food insecurity:    Worry: Not on file    Inability: Not on file  . Transportation needs:    Medical: Not on file    Non-medical: Not on file  Tobacco Use  . Smoking status: Never Smoker  . Smokeless tobacco: Current User  Substance and Sexual Activity  . Alcohol use: No  . Drug use: Not Currently  . Sexual activity: Not on file  Lifestyle  . Physical activity:    Days per week: Not on file    Minutes per session: Not on file  . Stress: Not on file  Relationships  . Social connections:    Talks on phone: Not on file    Gets together: Not on file    Attends religious service: Not on file    Active member of club or organization: Not on file    Attends meetings of clubs or organizations: Not on file    Relationship status: Not on file  . Intimate partner violence:    Fear of current or ex partner: Not on file    Emotionally abused: Not  on file    Physically abused: Not on file    Forced sexual activity: Not on file  Other Topics Concern  . Not on file  Social History Narrative  . Not on file    Review of Systems: See HPI, otherwise negative ROS  Physical Exam: BP 137/71   Pulse (!) 57   Temp 98 F (36.7 C) (Oral)   Resp 16   SpO2 98%  General:   Alert,  pleasant and cooperative in NAD Head:  Normocephalic and atraumatic. Neck:  Supple; no masses or thyromegaly. Lungs:  Clear throughout to auscultation, normal respiratory effort.    Heart:  +S1, +S2, Regular rate and rhythm, No edema. Abdomen:  Soft, nontender and nondistended. Normal bowel sounds, without guarding, and without rebound.   Neurologic:  Alert and  oriented x4;  grossly normal neurologically.  Impression/Plan: Justin Keith is here for  an colonoscopy to be performed for Screening colonoscopy average risk   Risks, benefits, limitations, and alternatives regarding  colonoscopy have been reviewed with the patient.  Questions have been answered.  All parties agreeable.   Jonathon Bellows, MD  02/26/2018, 1:50 PM

## 2018-03-01 ENCOUNTER — Encounter: Payer: Self-pay | Admitting: Gastroenterology

## 2018-03-03 LAB — SURGICAL PATHOLOGY

## 2018-03-04 ENCOUNTER — Encounter: Payer: Self-pay | Admitting: Gastroenterology

## 2018-03-06 ENCOUNTER — Emergency Department
Admission: EM | Admit: 2018-03-06 | Discharge: 2018-03-07 | Disposition: A | Discharge status: Home / Self Care | Payer: Medicare HMO | Attending: Emergency Medicine | Admitting: Emergency Medicine

## 2018-03-06 DIAGNOSIS — Z7984 Long term (current) use of oral hypoglycemic drugs: Secondary | ICD-10-CM | POA: Insufficient documentation

## 2018-03-06 DIAGNOSIS — I10 Essential (primary) hypertension: Secondary | ICD-10-CM | POA: Diagnosis not present

## 2018-03-06 DIAGNOSIS — E119 Type 2 diabetes mellitus without complications: Secondary | ICD-10-CM | POA: Insufficient documentation

## 2018-03-06 DIAGNOSIS — K209 Esophagitis, unspecified: Secondary | ICD-10-CM | POA: Insufficient documentation

## 2018-03-06 DIAGNOSIS — Z951 Presence of aortocoronary bypass graft: Secondary | ICD-10-CM | POA: Diagnosis not present

## 2018-03-06 DIAGNOSIS — K222 Esophageal obstruction: Secondary | ICD-10-CM | POA: Diagnosis present

## 2018-03-06 DIAGNOSIS — I2510 Atherosclerotic heart disease of native coronary artery without angina pectoris: Secondary | ICD-10-CM | POA: Diagnosis not present

## 2018-03-06 DIAGNOSIS — T18128A Food in esophagus causing other injury, initial encounter: Secondary | ICD-10-CM | POA: Diagnosis not present

## 2018-03-06 DIAGNOSIS — X58XXXA Exposure to other specified factors, initial encounter: Secondary | ICD-10-CM | POA: Insufficient documentation

## 2018-03-06 DIAGNOSIS — E785 Hyperlipidemia, unspecified: Secondary | ICD-10-CM | POA: Diagnosis not present

## 2018-03-06 DIAGNOSIS — Z79899 Other long term (current) drug therapy: Secondary | ICD-10-CM | POA: Diagnosis not present

## 2018-03-06 DIAGNOSIS — Z7982 Long term (current) use of aspirin: Secondary | ICD-10-CM | POA: Diagnosis not present

## 2018-03-07 ENCOUNTER — Encounter
Admission: EM | Disposition: A | Discharge status: Home / Self Care | Payer: Self-pay | Source: Home / Self Care | Attending: Emergency Medicine

## 2018-03-07 ENCOUNTER — Emergency Department: Payer: Medicare HMO | Admitting: Certified Registered Nurse Anesthetist

## 2018-03-07 ENCOUNTER — Other Ambulatory Visit: Payer: Self-pay

## 2018-03-07 DIAGNOSIS — K222 Esophageal obstruction: Secondary | ICD-10-CM | POA: Diagnosis not present

## 2018-03-07 DIAGNOSIS — T18128A Food in esophagus causing other injury, initial encounter: Secondary | ICD-10-CM

## 2018-03-07 HISTORY — PX: FOREIGN BODY REMOVAL: SHX962

## 2018-03-07 SURGERY — REMOVAL, FOREIGN BODY
Anesthesia: General

## 2018-03-07 MED ORDER — PHENYLEPHRINE HCL 10 MG/ML IJ SOLN
INTRAMUSCULAR | Status: DC | PRN
  Administered 2018-03-07: 100 ug via INTRAVENOUS

## 2018-03-07 MED ORDER — FENTANYL CITRATE (PF) 100 MCG/2ML IJ SOLN
INTRAMUSCULAR | Status: DC | PRN
  Administered 2018-03-07: 50 ug via INTRAVENOUS

## 2018-03-07 MED ORDER — EPHEDRINE SULFATE 50 MG/ML IJ SOLN
INTRAMUSCULAR | Status: DC | PRN
  Administered 2018-03-07 (×2): 5 mg via INTRAVENOUS
  Administered 2018-03-07 (×2): 10 mg via INTRAVENOUS
  Administered 2018-03-07: 5 mg via INTRAVENOUS

## 2018-03-07 MED ORDER — LIDOCAINE HCL (CARDIAC) PF 100 MG/5ML IV SOSY
PREFILLED_SYRINGE | INTRAVENOUS | Status: DC | PRN
  Administered 2018-03-07: 80 mg via INTRAVENOUS

## 2018-03-07 MED ORDER — FENTANYL CITRATE (PF) 100 MCG/2ML IJ SOLN: 25.0000 ug | INTRAMUSCULAR | Status: DC | PRN

## 2018-03-07 MED ORDER — ONDANSETRON HCL 4 MG/2ML IJ SOLN
INTRAMUSCULAR | Status: DC | PRN
  Administered 2018-03-07: 4 mg via INTRAVENOUS

## 2018-03-07 MED ORDER — PROPOFOL 10 MG/ML IV BOLUS
INTRAVENOUS | Status: DC | PRN
  Administered 2018-03-07: 30 mg via INTRAVENOUS
  Administered 2018-03-07: 140 mg via INTRAVENOUS

## 2018-03-07 MED ORDER — GLUCAGON HCL RDNA (DIAGNOSTIC) 1 MG IJ SOLR
1.0000 mg | Freq: Once | INTRAMUSCULAR | Status: AC
  Administered 2018-03-07: 1 mg via INTRAVENOUS

## 2018-03-07 MED ORDER — NITROGLYCERIN 0.4 MG SL SUBL
0.4000 mg | SUBLINGUAL_TABLET | SUBLINGUAL | Status: DC | PRN
  Administered 2018-03-07: 0.4 mg via SUBLINGUAL

## 2018-03-07 MED ORDER — LACTATED RINGERS IV SOLN
INTRAVENOUS | Status: DC | PRN
  Administered 2018-03-07: 01:00:00 via INTRAVENOUS

## 2018-03-07 MED ORDER — SODIUM CHLORIDE FLUSH 0.9 % IV SOLN
INTRAVENOUS | Status: AC
  Filled 2018-03-07: qty 10

## 2018-03-07 MED ORDER — PROMETHAZINE HCL 25 MG/ML IJ SOLN: 6.2500 mg | INTRAMUSCULAR | Status: DC | PRN

## 2018-03-07 MED ORDER — SUCCINYLCHOLINE CHLORIDE 20 MG/ML IJ SOLN
INTRAMUSCULAR | Status: DC | PRN
  Administered 2018-03-07: 100 mg via INTRAVENOUS

## 2018-03-07 NOTE — Op Note (Addendum)
[facility] Gastroenterology Patient Name: Justin Keith Procedure Date: 03/07/2018 1:16 AM MRN: [id number] Account #: [id number] Date of Birth: 1946/04/11 Admit Type: Outpatient Age: 72 Room: [facility] ENDO ROOM 4 Gender: Male Note Status: Finalized Procedure:            Upper GI endoscopy Indications:          Foreign body in the esophagus Providers:            Jonathon Bellows MD, MD Medicines:            General Anesthesia Complications:        No immediate complications. Procedure:            Pre-Anesthesia Assessment:                       - Prior to the procedure, a History and Physical was                        performed, and patient medications, allergies and                        sensitivities were reviewed. The patient's tolerance of                        previous anesthesia was reviewed.                       - The risks and benefits of the procedure and the                        sedation options and risks were discussed with the                        patient. All questions were answered and informed                        consent was obtained.                       - ASA Grade Assessment: III - A patient with severe                        systemic disease.                       After obtaining informed consent, the endoscope was                        passed under direct vision. Throughout the procedure,                        the patient's blood pressure, pulse, and oxygen                        saturations were monitored continuously. The Endoscope                        was introduced through the mouth, and advanced to the                        third part of duodenum. The upper GI endoscopy was  somewhat difficult due to presence of food. The patient                        tolerated the procedure well. Findings:      The examined duodenum was normal.      The stomach was normal.      The cardia and gastric fundus were normal on  retroflexion.      Food was found at the gastroesophageal junction. Food bolus of steak       extracted using multiple modalities including forceps, net,tripod with       success      One benign-appearing, intrinsic moderate (circumferential scarring or       stenosis; an endoscope may pass) stenosis was found at the       gastroesophageal junction. This stenosis measured 1.3 cm (inner       diameter). The stenosis was traversed.      LA Grade A (one or more mucosal breaks less than 5 mm, not extending       between tops of 2 mucosal folds) esophagitis with no bleeding was found       at the gastroesophageal junction. Impression:           - Normal examined duodenum.                       - Normal stomach.                       - Food at the gastroesophageal junction.                       - Benign-appearing esophageal stenosis.                       - LA Grade A esophagitis.                       - No specimens collected. Recommendation:       - Discharge patient to home (with escort).                       - Mechanical soft diet for 3 months.                       - Continue present medications.                       - Use Prilosec (omeprazole) 40 mg PO daily for 6 weeks.                       - Repeat upper endoscopy in 6 weeks to assess disease                        activity.                       - Return to GI office in 2 weeks. Procedure Code(s):    --- Professional ---                       [phone], Esophagogastroduodenoscopy, flexible, transoral;  diagnostic, including collection of specimen(s) by                        brushing or washing, when performed (separate procedure) Diagnosis Code(s):    --- Professional ---                       U20.254Y, Food in esophagus causing other injury,                        initial encounter                       K22.2, Esophageal obstruction                       K20.9, Esophagitis, unspecified                        T18.108A, Unspecified foreign body in esophagus causing                        other injury, initial encounter CPT copyright 2017 American Medical Association. All rights reserved. The codes documented in this report are preliminary and upon coder review may  be revised to meet current compliance requirements. Jonathon Bellows, MD Jonathon Bellows MD, MD 03/07/2018 2:06:29 AM This report has been signed electronically. Number of Addenda: 0 Note Initiated On: 03/07/2018 1:16 AM      [facility]

## 2018-03-07 NOTE — Anesthesia Post-op Follow-up Note (Signed)
Anesthesia QCDR form completed.        

## 2018-03-07 NOTE — Transfer of Care (Signed)
Immediate Anesthesia Transfer of Care Note  Patient: Justin Keith  Procedure(s) Performed: FOREIGN BODY REMOVAL (N/A )  Patient Location: PACU  Anesthesia Type:General  Level of Consciousness: awake and alert   Airway & Oxygen Therapy: Patient Spontanous Breathing and Patient connected to face mask oxygen  Post-op Assessment: Report given to RN and Post -op Vital signs reviewed and stable  Post vital signs: Reviewed and stable  Last Vitals:  Vitals Value Taken Time  BP 128/77 03/07/2018  2:18 AM  Temp 36 C 03/07/2018  2:18 AM  Pulse 70 03/07/2018  2:24 AM  Resp 0 03/07/2018  2:24 AM  SpO2 100 % 03/07/2018  2:24 AM  Vitals shown include unvalidated device data.  Last Pain:  Vitals:   03/07/18 0218  TempSrc:   PainSc: Asleep         Complications: No apparent anesthesia complications

## 2018-03-07 NOTE — ED Provider Notes (Signed)
[facility] Emergency Department Provider Note ____________________________________________   First MD Initiated Contact with Patient 03/07/18 0003     (approximate)  I have reviewed the triage vital signs and the nursing notes.   HISTORY  Chief Complaint Swallowed Foreign Body   HPI Justin Keith is a 72 y.o. male with a history of diabetes as well as CAD and hypertension was presenting with what he thinks is an esophageal food impaction.  He says that he was eating steak and potatoes at approximately 1 PM this afternoon.  He says he has not been able to eat or drink since then and has been spitting up saliva.  He states that he is try to drink water to clear the obstruction but he has been unable.  Says that he had a similar issue one time in the past but it cleared spontaneously.   Past Medical History:  Diagnosis Date  . Diabetes mellitus without complication ([place])   . Hyperlipemia   . Hypertension     Patient Active Problem List   Diagnosis Date Noted  . Chest pain 12/07/2015    Past Surgical History:  Procedure Laterality Date  . CARDIAC SURGERY    . COLONOSCOPY WITH PROPOFOL N/A 02/25/2018   Procedure: COLONOSCOPY WITH PROPOFOL;  Surgeon: Jonathon Bellows, MD;  Location: [facility] ENDOSCOPY;  Service: Gastroenterology;  Laterality: N/A;  . COLONOSCOPY WITH PROPOFOL N/A 02/26/2018   Procedure: COLONOSCOPY WITH PROPOFOL;  Surgeon: Jonathon Bellows, MD;  [facility]: [facility] ENDOSCOPY;  Service: Gastroenterology;  Laterality: N/A;  . LEFT HEART CATH AND CORS/GRAFTS ANGIOGRAPHY N/A 08/07/2017   Procedure: LEFT HEART CATH AND CORS/GRAFTS ANGIOGRAPHY;  Surgeon: Yolonda Kida, MD;  Location: [facility];  Service: Cardiovascular;  Laterality: N/A;  . PROSTATE SURGERY    . SHOULDER ARTHROSCOPY      Prior to Admission medications   Medication Sig Start Date End Date Taking? Authorizing Provider  amLODipine (NORVASC) 10 MG tablet Take 1 tablet (10 mg  total) by mouth daily. 12/08/15   Vaughan Basta, MD  aspirin 81 MG chewable tablet Chew 1 tablet (81 mg total) by mouth daily. 12/08/15   Vaughan Basta, MD  atorvastatin (LIPITOR) 80 MG tablet Take 1 tablet by mouth daily.    [provider]  lisinopril (PRINIVIL,ZESTRIL) 10 MG tablet Take 1 tablet (10 mg total) by mouth daily. 12/08/15   Vaughan Basta, MD  metFORMIN (GLUCOPHAGE-XR) 500 MG 24 hr tablet Take 500 mg by mouth 2 (two) times daily.     [provider]  nitroGLYCERIN (NITROSTAT) 0.4 MG SL tablet Place 0.4 mg under the tongue every 5 (five) minutes as needed for chest pain.    [provider]  ranitidine (ZANTAC) 150 MG tablet Take 150 mg by mouth 2 (two) times daily.     [provider]    Allergies Patient has no known allergies.  History reviewed. No pertinent family history.  Social History Social History   Tobacco Use  . Smoking status: Never Smoker  . Smokeless tobacco: Current User  Substance Use Topics  . Alcohol use: No  . Drug use: Not Currently    Review of Systems  Constitutional: No fever/chills Eyes: No visual changes. ENT: No sore throat. Cardiovascular: Denies chest pain. Respiratory: Denies shortness of breath. Gastrointestinal: No abdominal pain.  No nausea, no vomiting.  No diarrhea.  No constipation. Genitourinary: Negative for dysuria. Musculoskeletal: Negative for back pain. Skin: Negative for rash. Neurological: Negative for headaches, focal weakness  or numbness.   ____________________________________________   PHYSICAL EXAM:  VITAL SIGNS: ED Triage Vitals  Enc Vitals Group     BP      Pulse      Resp      Temp      Temp src      SpO2      Weight      Height      Head Circumference      Peak Flow      Pain Score      Pain Loc      Pain Edu?      Excl. in [place]?     Constitutional: Alert and oriented.  In no acute distress.  Speaking in full sentences.  No respiratory  distress.  Holding emesis bag and spitting up clear saliva every 1 to 2 minutes. Eyes: Conjunctivae are normal.  Head: Atraumatic. Nose: No congestion/rhinnorhea. Mouth/Throat: Mucous membranes are moist.  No foreign body visualized on posterior pharyngeal exam. Neck: No stridor.   Cardiovascular: Normal rate, regular rhythm. Grossly normal heart sounds.   Respiratory: Normal respiratory effort.  No retractions. Lungs CTAB. Gastrointestinal: Soft and nontender. No distention.  Musculoskeletal: No lower extremity tenderness nor edema.  No joint effusions. Neurologic:  Normal speech and language. No gross focal neurologic deficits are appreciated. Skin:  Skin is warm, dry and intact. No rash noted. Psychiatric: Mood and affect are normal. Speech and behavior are normal.  ____________________________________________   LABS (all labs ordered are listed, but only abnormal results are displayed)  Labs Reviewed - No data to display ____________________________________________  EKG   ____________________________________________  RADIOLOGY   ____________________________________________   PROCEDURES  Procedure(s) performed:   Procedures  Critical Care performed:   ____________________________________________   INITIAL IMPRESSION / ASSESSMENT AND PLAN / ED COURSE  Pertinent labs & imaging results that were available during my care of the patient were reviewed by me and considered in my medical decision making (see chart for details).  DDX: Food impaction, esophageal motility disorder, foreign body As part of my medical decision making, I reviewed the following data within the electronic MEDICAL RECORD NUMBER Notes from prior ED visits  ----------------------------------------- 12:28 AM on 03/07/2018 -----------------------------------------  Despite NitroTab, glucagon IV as well as trying to swallow warm Coke the patient still says that he feels like his condition is  unchanged.  Spit up the Coke as well as continues to spit saliva.  Discussed case with Dr. Vicente Males of gastroenterology who will be coming into scope the patient.  Patient aware of diagnosis as well as treatment plan willing to comply. ____________________________________________   FINAL CLINICAL IMPRESSION(S) / ED DIAGNOSES  Esophageal food impaction.  NEW MEDICATIONS STARTED DURING THIS VISIT:  New Prescriptions   No medications on file     Note:  This document was prepared using Dragon voice recognition software and may include unintentional dictation errors.     Orbie Pyo, MD 03/07/18 [phone]

## 2018-03-07 NOTE — Anesthesia Preprocedure Evaluation (Addendum)
Anesthesia Evaluation  Patient identified by MRN, date of birth, ID band Patient awake    Reviewed: Allergy & Precautions, H&P , NPO status , reviewed documented beta blocker date and time   Airway Mallampati: III  TM Distance: >3 FB Neck ROM: limited    Dental  (+) Poor Dentition, Chipped, Missing   Pulmonary           Cardiovascular hypertension, + CABG    Cardiac Cath 07/2017 Conclusion Mildly reduced overall left ventricular function ejection fraction around 45% Severe multivessel coronary disease 100% proximal LAD, occluded OM 1 and 2, occluded ostial RCA Patent  circumflex with diffuse 50% LIMA to mid LAD widely patent Vein graft to OM1 widely patent Vein graft to distal RCA widely patent Recommend medical therapy patient adequately revascularized   Neuro/Psych    GI/Hepatic   Endo/Other  diabetes  Renal/GU      Musculoskeletal   Abdominal   Peds  Hematology   Anesthesia Other Findings Past Medical History: No date: Diabetes mellitus without complication ([place]) No date: Hyperlipemia No date: Hypertension  Past Surgical History: No date: CARDIAC SURGERY 08/07/2017: LEFT HEART CATH AND CORS/GRAFTS ANGIOGRAPHY; N/A     Comment:  Procedure: LEFT HEART CATH AND CORS/GRAFTS ANGIOGRAPHY;               Surgeon: Yolonda Kida, MD;  Location: [facility];  Service: Cardiovascular;  Laterality: N/A; No date: PROSTATE SURGERY No date: SHOULDER ARTHROSCOPY  BMI    Body Mass Index:  34.36 kg/m      Reproductive/Obstetrics                            Anesthesia Physical  Anesthesia Plan  ASA: III  Anesthesia Plan: General ETT, Rapid Sequence and Cricoid Pressure   Post-op Pain Management:    Induction: Intravenous  PONV Risk Score and Plan:   Airway Management Planned: Nasal Cannula and Natural Airway  Additional Equipment:   Intra-op Plan:    Post-operative Plan:   Informed Consent: I have reviewed the patients History and Physical, chart, labs and discussed the procedure including the risks, benefits and alternatives for the proposed anesthesia with the patient or authorized representative who has indicated his/her understanding and acceptance.   Dental Advisory Given  Plan Discussed with:   Anesthesia Plan Comments:         Anesthesia Quick Evaluation

## 2018-03-07 NOTE — Anesthesia Procedure Notes (Signed)
Procedure Name: Intubation Date/Time: 03/07/2018 1:22 AM Performed by: Justin Acosta, CRNA Pre-anesthesia Checklist: Patient identified, Emergency Drugs available, Suction available, Patient being monitored and Timeout performed Patient Re-evaluated:Patient Re-evaluated prior to induction Oxygen Delivery Method: Circle system utilized Preoxygenation: Pre-oxygenation with 100% oxygen Induction Type: IV induction, Rapid sequence and Cricoid Pressure applied Laryngoscope Size: Miller and 2 Grade View: Grade I Tube type: Oral Tube size: 7.5 mm Number of attempts: 1 Airway Equipment and Method: Stylet Placement Confirmation: ETT inserted through vocal cords under direct vision,  positive ETCO2 and breath sounds checked- equal and bilateral Secured at: 22 cm Tube secured with: Tape Dental Injury: Teeth and Oropharynx as per pre-operative assessment

## 2018-03-07 NOTE — Consult Note (Signed)
Jonathon Bellows , MD [address], [place], [place], [place], [address] [address] [place], [place], [place], [address] Phone: [phone]  Fax: [phone]  Consultation  Referring Provider: ER Primary Care Physician:  [facility] Primary Gastroenterologist:  Dr. Vicente Males         Reason for Consultation:     Food impaction   Date of Admission:  03/06/2018 Date of Consultation:  03/07/2018         HPI:   Justin Keith is a 72 y.o. male had some steak at 1 pm and since then unable to swallow, spitting constantly. Never happened before. Not on any blood thinners. No asthma or allergies.  Past Medical History:  Diagnosis Date  . Diabetes mellitus without complication ([place])   . Hyperlipemia   . Hypertension     Past Surgical History:  Procedure Laterality Date  . CARDIAC SURGERY    . COLONOSCOPY WITH PROPOFOL N/A 02/25/2018   Procedure: COLONOSCOPY WITH PROPOFOL;  Surgeon: Jonathon Bellows, MD;  Location: [facility] ENDOSCOPY;  Service: Gastroenterology;  Laterality: N/A;  . COLONOSCOPY WITH PROPOFOL N/A 02/26/2018   Procedure: COLONOSCOPY WITH PROPOFOL;  Surgeon: Jonathon Bellows, MD;  Location: [facility] ENDOSCOPY;  Service: Gastroenterology;  Laterality: N/A;  . LEFT HEART CATH AND CORS/GRAFTS ANGIOGRAPHY N/A 08/07/2017   Procedure: LEFT HEART CATH AND CORS/GRAFTS ANGIOGRAPHY;  Surgeon: Yolonda Kida, MD;  Location: [place] [facility];  Service: Cardiovascular;  Laterality: N/A;  . PROSTATE SURGERY    . SHOULDER ARTHROSCOPY      Prior to Admission medications   Medication Sig Start Date End Date Taking? Authorizing Provider  amLODipine (NORVASC) 10 MG tablet Take 1 tablet (10 mg total) by mouth daily. 12/08/15  Yes Vaughan Basta, MD  aspirin 81 MG chewable tablet Chew 1 tablet (81 mg total) by mouth daily. 12/08/15  Yes Vaughan Basta, MD  atorvastatin (LIPITOR) 80 MG tablet Take 1 tablet by mouth daily.   Yes [provider]  lisinopril  (PRINIVIL,ZESTRIL) 10 MG tablet Take 1 tablet (10 mg total) by mouth daily. 12/08/15  Yes Vaughan Basta, MD  metFORMIN (GLUCOPHAGE-XR) 500 MG 24 hr tablet Take 500 mg by mouth 2 (two) times daily.    Yes [provider]  ranitidine (ZANTAC) 150 MG tablet Take 150 mg by mouth 2 (two) times daily.    Yes [provider]  nitroGLYCERIN (NITROSTAT) 0.4 MG SL tablet Place 0.4 mg under the tongue every 5 (five) minutes as needed for chest pain.    [provider]    History reviewed. No pertinent family history.   Social History   Tobacco Use  . Smoking status: Never Smoker  . Smokeless tobacco: Current User  Substance Use Topics  . Alcohol use: No  . Drug use: Not Currently    Allergies as of 03/06/2018  . (No Known Allergies)    Review of Systems:    All systems reviewed and negative except where noted in HPI.   Physical Exam:  Vital signs in last 24 hours: Temp:  [98 F (36.7 C)] 98 F (36.7 C) (10/06 0016) Pulse Rate:  [70] 70 (10/06 0016) Resp:  [18] 18 (10/06 0016) BP: (144)/(60) 144/60 (10/06 0016) SpO2:  [98 %] 98 % (10/06 0016) Weight:  [102.1 kg] 102.1 kg (10/06 0017)   General:   Pleasant, cooperative in NAD Head:  Normocephalic and atraumatic. Eyes:   No icterus.   Conjunctiva pink. PERRLA. Ears:  Normal auditory acuity.  Neck:  Supple; no masses or thyroidomegaly Lungs: Respirations even and unlabored. Lungs clear to auscultation bilaterally.   No wheezes, crackles, or rhonchi.  Heart:  Regular rate and rhythm;  Without murmur, clicks, rubs or gallops Abdomen:  Soft, nondistended, nontender. Normal bowel sounds. No appreciable masses or hepatomegaly.  No rebound or guarding.  Neurologic:  Alert and oriented x3;  grossly normal neurologically. Skin:  Intact without significant lesions or rashes. Cervical Nodes:  No significant cervical adenopathy. Psych:  Alert and cooperative. Normal affect.  LAB RESULTS: No results for  input(s): WBC, HGB, HCT, PLT in the last 72 hours. BMET No results for input(s): NA, K, CL, CO2, GLUCOSE, BUN, CREATININE, CALCIUM in the last 72 hours. LFT No results for input(s): PROT, ALBUMIN, AST, ALT, ALKPHOS, BILITOT, BILIDIR, IBILI in the last 72 hours. PT/INR No results for input(s): LABPROT, INR in the last 72 hours.  STUDIES: No results found.    Impression / Plan:   Justin Keith is a 72 y.o. y/o male with a food impaction   Plan   EGD  I have discussed alternative options, risks & benefits,  which include, but are not limited to, bleeding, infection, perforation,respiratory complication & drug reaction.  The patient agrees with this plan & written consent will be obtained.      Thank you for involving me in the care of this patient.      LOS: 0 days   Jonathon Bellows, MD  03/07/2018, 1:11 AM

## 2018-03-07 NOTE — Discharge Instructions (Signed)

## 2018-03-08 ENCOUNTER — Encounter: Payer: Self-pay | Admitting: Gastroenterology

## 2018-03-08 LAB — GLUCOSE, CAPILLARY: GLUCOSE-CAPILLARY: 137 mg/dL — AB (ref 70–99)

## 2018-03-09 NOTE — Anesthesia Postprocedure Evaluation (Signed)
Anesthesia Post Note  Patient: Justin Keith  Procedure(s) Performed: FOREIGN BODY REMOVAL (N/A )  Patient location during evaluation: PACU Anesthesia Type: General Level of consciousness: awake and alert Pain management: pain level controlled Vital Signs Assessment: post-procedure vital signs reviewed and stable Respiratory status: spontaneous breathing, nonlabored ventilation and respiratory function stable Cardiovascular status: blood pressure returned to baseline and stable Postop Assessment: no apparent nausea or vomiting Anesthetic complications: no     Last Vitals:  Vitals:   03/07/18 0235 03/07/18 0246  BP: 129/78 124/71  Pulse: 67 65  Resp: 20 15  Temp:    SpO2: 100% 97%    Last Pain:  Vitals:   03/08/18 0756  TempSrc:   PainSc: 0-No pain                 Durenda Hurt

## 2018-09-14 ENCOUNTER — Observation Stay
Admission: EM | Admit: 2018-09-14 | Discharge: 2018-09-15 | Disposition: A | Discharge status: Home / Self Care | Payer: Medicare HMO | Attending: Internal Medicine | Admitting: Internal Medicine

## 2018-09-14 DIAGNOSIS — E1122 Type 2 diabetes mellitus with diabetic chronic kidney disease: Secondary | ICD-10-CM | POA: Diagnosis not present

## 2018-09-14 DIAGNOSIS — I2510 Atherosclerotic heart disease of native coronary artery without angina pectoris: Secondary | ICD-10-CM | POA: Diagnosis not present

## 2018-09-14 DIAGNOSIS — E7800 Pure hypercholesterolemia, unspecified: Secondary | ICD-10-CM | POA: Insufficient documentation

## 2018-09-14 DIAGNOSIS — R079 Chest pain, unspecified: Secondary | ICD-10-CM

## 2018-09-14 DIAGNOSIS — E119 Type 2 diabetes mellitus without complications: Secondary | ICD-10-CM

## 2018-09-14 DIAGNOSIS — K219 Gastro-esophageal reflux disease without esophagitis: Secondary | ICD-10-CM | POA: Insufficient documentation

## 2018-09-14 DIAGNOSIS — N189 Chronic kidney disease, unspecified: Secondary | ICD-10-CM | POA: Insufficient documentation

## 2018-09-14 DIAGNOSIS — Z7982 Long term (current) use of aspirin: Secondary | ICD-10-CM | POA: Diagnosis not present

## 2018-09-14 DIAGNOSIS — Z951 Presence of aortocoronary bypass graft: Secondary | ICD-10-CM | POA: Diagnosis not present

## 2018-09-14 DIAGNOSIS — IMO0002 Reserved for concepts with insufficient information to code with codable children: Secondary | ICD-10-CM

## 2018-09-14 DIAGNOSIS — R0789 Other chest pain: Principal | ICD-10-CM | POA: Insufficient documentation

## 2018-09-14 DIAGNOSIS — Z79899 Other long term (current) drug therapy: Secondary | ICD-10-CM | POA: Diagnosis not present

## 2018-09-14 DIAGNOSIS — R61 Generalized hyperhidrosis: Secondary | ICD-10-CM | POA: Diagnosis not present

## 2018-09-14 DIAGNOSIS — I10 Essential (primary) hypertension: Secondary | ICD-10-CM | POA: Diagnosis present

## 2018-09-14 DIAGNOSIS — I200 Unstable angina: Secondary | ICD-10-CM | POA: Diagnosis present

## 2018-09-14 DIAGNOSIS — Z7984 Long term (current) use of oral hypoglycemic drugs: Secondary | ICD-10-CM | POA: Insufficient documentation

## 2018-09-14 DIAGNOSIS — E785 Hyperlipidemia, unspecified: Secondary | ICD-10-CM | POA: Diagnosis present

## 2018-09-14 DIAGNOSIS — E669 Obesity, unspecified: Secondary | ICD-10-CM | POA: Insufficient documentation

## 2018-09-14 DIAGNOSIS — I129 Hypertensive chronic kidney disease with stage 1 through stage 4 chronic kidney disease, or unspecified chronic kidney disease: Secondary | ICD-10-CM | POA: Insufficient documentation

## 2018-09-14 HISTORY — DX: Chronic kidney disease, unspecified: N18.9

## 2018-09-14 HISTORY — DX: Gastro-esophageal reflux disease without esophagitis: K21.9

## 2018-09-14 HISTORY — DX: Atherosclerotic heart disease of native coronary artery without angina pectoris: I25.10

## 2018-09-14 NOTE — ED Provider Notes (Signed)
[facility] Emergency Department Provider Note   ____________________________________________   First MD Initiated Contact with Patient 09/14/18 2358     (approximate)  I have reviewed the triage vital signs and the nursing notes.   HISTORY  Chief Complaint Chest pain   HPI Justin Keith is a 73 y.o. male brought to the ED from home via EMS with a chief complaint of chest pain.  Patient has a history of CAD status post stents.  Complains of waxing/waning chest pain today.  Symptoms associated with mild diaphoresis and shortness of breath.  Denies associated palpitations, nausea/vomiting or dizziness.  Denies recent fever, cough, abdominal pain, diarrhea.  Denies recent travel, trauma or exposure to persons diagnosed with coronavirus.       Past Medical History:  Diagnosis Date  . CAD (coronary artery disease)   . CKD (chronic kidney disease)   . Diabetes mellitus without complication ([name])   . GERD (gastroesophageal reflux disease)   . Hyperlipemia   . Hypertension     Patient Active Problem List   Diagnosis Date Noted  . HTN (hypertension) 09/15/2018  . HLD (hyperlipidemia) 09/15/2018  . GERD (gastroesophageal reflux disease) 09/15/2018  . CAD (coronary artery disease) 09/15/2018  . Diabetes ([name]) 09/15/2018  . Chest pain 12/07/2015    Past Surgical History:  Procedure Laterality Date  . CARDIAC SURGERY    . COLONOSCOPY WITH PROPOFOL N/A 02/25/2018   Procedure: COLONOSCOPY WITH PROPOFOL;  Surgeon: Jonathon Bellows, MD;  Location: [facility] ENDOSCOPY;  Service: Gastroenterology;  Laterality: N/A;  . COLONOSCOPY WITH PROPOFOL N/A 02/26/2018   Procedure: COLONOSCOPY WITH PROPOFOL;  Surgeon: Jonathon Bellows, MD;  Location: [facility] ENDOSCOPY;  Service: Gastroenterology;  Laterality: N/A;  . FOREIGN BODY REMOVAL N/A 03/07/2018   Procedure: FOREIGN BODY REMOVAL;  Surgeon: Jonathon Bellows, MD;  Location: [facility] ENDOSCOPY;  Service: Gastroenterology;  Laterality: N/A;  .  LEFT HEART CATH AND CORS/GRAFTS ANGIOGRAPHY N/A 08/07/2017   Procedure: LEFT HEART CATH AND CORS/GRAFTS ANGIOGRAPHY;  Surgeon: Yolonda Kida, MD;  Location: [place] CV LAB;  Service: Cardiovascular;  Laterality: N/A;  . PROSTATE SURGERY    . SHOULDER ARTHROSCOPY      Prior to Admission medications   Medication Sig Start Date End Date Taking? Authorizing Provider  amLODipine (NORVASC) 10 MG tablet Take 1 tablet (10 mg total) by mouth daily. 12/08/15  Yes Vaughan Basta, MD  aspirin 81 MG chewable tablet Chew 1 tablet (81 mg total) by mouth daily. 12/08/15  Yes Vaughan Basta, MD  atorvastatin (LIPITOR) 80 MG tablet Take 1 tablet by mouth daily.   Yes [provider]  lisinopril (PRINIVIL,ZESTRIL) 10 MG tablet Take 1 tablet (10 mg total) by mouth daily. 12/08/15  Yes Vaughan Basta, MD  metFORMIN (GLUCOPHAGE-XR) 500 MG 24 hr tablet Take 500 mg by mouth 2 (two) times daily.    Yes [provider]  nitroGLYCERIN (NITROSTAT) 0.4 MG SL tablet Place 0.4 mg under the tongue every 5 (five) minutes as needed for chest pain.   Yes [provider]  ranitidine (ZANTAC) 150 MG tablet Take 150 mg by mouth 2 (two) times daily.    Yes [provider]    Allergies Patient has no known allergies.  History reviewed. No pertinent family history.  Social History Social History   Tobacco Use  . Smoking status: Never Smoker  . Smokeless tobacco: Current User  Substance Use Topics  . Alcohol use: No  . Drug use: Not Currently  Review of Systems  Constitutional: No fever/chills Eyes: No visual changes. ENT: No sore throat. Cardiovascular: Positive for chest pain. Respiratory: Denies shortness of breath. Gastrointestinal: No abdominal pain.  No nausea, no vomiting.  No diarrhea.  No constipation. Genitourinary: Negative for dysuria. Musculoskeletal: Negative for back pain. Skin: Negative for rash. Neurological: Negative for headaches,  focal weakness or numbness.   ____________________________________________   PHYSICAL EXAM:  VITAL SIGNS: ED Triage Vitals  Enc Vitals Group     BP      Pulse      Resp      Temp      Temp src      SpO2      Weight      Height      Head Circumference      Peak Flow      Pain Score      Pain Loc      Pain Edu?      Excl. in [place]?     Constitutional: Alert and oriented. Well appearing and in no acute distress. Eyes: Conjunctivae are normal. PERRL. EOMI. Head: Atraumatic. Nose: No congestion/rhinnorhea. Mouth/Throat: Mucous membranes are moist.  Oropharynx non-erythematous. Neck: No stridor.   Cardiovascular: Normal rate, regular rhythm. Grossly normal heart sounds.  Good peripheral circulation. Respiratory: Normal respiratory effort.  No retractions. Lungs CTAB. Gastrointestinal: Soft and nontender. No distention. No abdominal bruits. No CVA tenderness. Musculoskeletal: No lower extremity tenderness nor edema.  No joint effusions. Neurologic:  Normal speech and language. No gross focal neurologic deficits are appreciated.  Skin:  Skin is warm, dry and intact. No rash noted. Psychiatric: Mood and affect are normal. Speech and behavior are normal.  ____________________________________________   LABS (all labs ordered are listed, but only abnormal results are displayed)  Labs Reviewed  CBC WITH DIFFERENTIAL/PLATELET - Abnormal; Notable for the following components:      Result Value   RBC 3.89 (*)    Hemoglobin 11.7 (*)    HCT 35.4 (*)    All other components within normal limits  COMPREHENSIVE METABOLIC PANEL - Abnormal; Notable for the following components:   Glucose, Bld 131 (*)    Creatinine, Ser 1.25 (*)    GFR calc non Af Amer 57 (*)    All other components within normal limits  TROPONIN I  CBC  CREATININE, SERUM  TROPONIN I  TROPONIN I   ____________________________________________  EKG  ED ECG REPORT I, Shelva Hetzer J, the attending physician,  personally viewed and interpreted this ECG.   Date: 09/15/2018  EKG Time: 0002  Rate: 63  Rhythm: normal EKG, normal sinus rhythm  Axis: Normal  Intervals:none  ST&T Change: Nonspecific  ____________________________________________  RADIOLOGY  ED MD interpretation: No acute cardiopulmonary process  Official radiology report(s): Dg Chest Port 1 View  Result Date: 09/15/2018 CLINICAL DATA:  73 y/o  M; chest pain since yesterday. EXAM: PORTABLE CHEST 1 VIEW COMPARISON:  07/25/2017 chest radiograph FINDINGS: Stable cardiac silhouette given projection and technique. Post median sternotomy with wires in alignment. Aortic atherosclerosis with calcification. Clear lungs. No pleural effusion or pneumothorax. No acute osseous abnormality is evident. Right proximal humerus chronic rotator cuff repair postsurgical changes. Osteoarthrosis of the right acromioclavicular joint. IMPRESSION: No active disease. Electronically Signed   By: Kristine Garbe M.D.   On: 09/15/2018 01:13    ____________________________________________   PROCEDURES  Procedure(s) performed (including Critical Care):  Procedures  CRITICAL CARE Performed by: Paulette Blanch   Total critical care time: 30 minutes  Critical care time was exclusive of separately billable procedures and treating other patients.  Critical care was necessary to treat or prevent imminent or life-threatening deterioration.  Critical care was time spent personally by me on the following activities: development of treatment plan with patient and/or surrogate as well as nursing, discussions with consultants, evaluation of patient's response to treatment, examination of patient, obtaining history from patient or surrogate, ordering and performing treatments and interventions, ordering and review of laboratory studies, ordering and review of radiographic studies, pulse oximetry and re-evaluation of patient's  condition. ____________________________________________   INITIAL IMPRESSION / ASSESSMENT AND PLAN / ED COURSE  As part of my medical decision making, I reviewed the following data within the [facility] notes reviewed and incorporated, Labs reviewed, EKG interpreted, Old chart reviewed, Radiograph reviewed, Discussed with admitting physician and Notes from prior ED visits        73 year old male with CAD status post stents who presents with chest pain. Differential diagnosis includes, but is not limited to, ACS, aortic dissection, pulmonary embolism, cardiac tamponade, pneumothorax, pneumonia, pericarditis, myocarditis, GI-related causes including esophagitis/gastritis, and musculoskeletal chest wall pain.    [name] was evaluated in Emergency Department on 09/15/2018 for the symptoms described in the history of present illness. He was evaluated in the context of the global COVID-19 pandemic, which necessitated consideration that the patient might be at risk for infection with the SARS-CoV-2 virus that causes COVID-19. Institutional protocols and algorithms that pertain to the evaluation of patients at risk for COVID-19 are in a state of rapid change based on information released by regulatory bodies including the CDC and federal and state organizations. These policies and algorithms were followed during the patient's care in the ED.  Patient received 2 nitroglycerin sprays and 4 baby aspirin per EMS which brought his pain down to 5/10.  At this time will administer 4 mg IV Morphine paired with 4 mg IV Zofran for pain and nausea.  Anticipate hospitalization.  Clinical Course as of Sep 15 223  Wed Sep 15, 2018  0045 Updated patient on all test results.  Discussed case with hospitalist Dr. Jannifer Franklin who will evaluate patient in the emergency department for admission.  Of note, patient's room air saturations are 95%.   [JS]    Clinical Course User Index [JS] Paulette Blanch, MD     ____________________________________________   FINAL CLINICAL IMPRESSION(S) / ED DIAGNOSES  Final diagnoses:  Chest pain, unspecified type  Unstable angina [facility])     ED Discharge Orders    None       Note:  This document was prepared using Dragon voice recognition software and may include unintentional dictation errors.   Paulette Blanch, MD 09/15/18 Reece Agar

## 2018-09-15 ENCOUNTER — Observation Stay: Payer: Medicare HMO

## 2018-09-15 ENCOUNTER — Emergency Department: Payer: Medicare HMO

## 2018-09-15 ENCOUNTER — Other Ambulatory Visit: Payer: Self-pay

## 2018-09-15 DIAGNOSIS — IMO0002 Reserved for concepts with insufficient information to code with codable children: Secondary | ICD-10-CM

## 2018-09-15 DIAGNOSIS — E785 Hyperlipidemia, unspecified: Secondary | ICD-10-CM | POA: Diagnosis present

## 2018-09-15 DIAGNOSIS — I10 Essential (primary) hypertension: Secondary | ICD-10-CM | POA: Diagnosis present

## 2018-09-15 DIAGNOSIS — I2510 Atherosclerotic heart disease of native coronary artery without angina pectoris: Secondary | ICD-10-CM | POA: Diagnosis present

## 2018-09-15 DIAGNOSIS — E119 Type 2 diabetes mellitus without complications: Secondary | ICD-10-CM

## 2018-09-15 DIAGNOSIS — K219 Gastro-esophageal reflux disease without esophagitis: Secondary | ICD-10-CM | POA: Diagnosis present

## 2018-09-15 LAB — TROPONIN I
Troponin I: <0.03 ng/mL (ref <0.03)
Troponin I: <0.03 ng/mL (ref <0.03)
Troponin I: <0.03 ng/mL (ref <0.03)

## 2018-09-15 LAB — COMPREHENSIVE METABOLIC PANEL
ALT: 16 U/L (ref 0–44)
AST: 19 U/L (ref 15–41)
Albumin: 4.2 g/dL (ref 3.5–5.0)
Alkaline Phosphatase: 62 U/L (ref 38–126)
Anion gap: 10 (ref 5–15)
BUN: 19 mg/dL (ref 8–23)
CO2: 22 mmol/L (ref 22–32)
Calcium: 9.3 mg/dL (ref 8.9–10.3)
Chloride: 105 mmol/L (ref 98–111)
Creatinine, Ser: 1.25 mg/dL — ABNORMAL HIGH (ref 0.61–1.24)
GFR calc Af Amer: >60 mL/min (ref >60)
GFR calc non Af Amer: 57 mL/min — ABNORMAL LOW (ref >60)
Glucose, Bld: 131 mg/dL — ABNORMAL HIGH (ref 70–99)
Potassium: 4 mmol/L (ref 3.5–5.1)
Sodium: 137 mmol/L (ref 135–145)
Total Bilirubin: 0.5 mg/dL (ref 0.3–1.2)
Total Protein: 7.7 g/dL (ref 6.5–8.1)

## 2018-09-15 LAB — CBC WITH DIFFERENTIAL/PLATELET
Abs Immature Granulocytes: 0.03 10*3/uL (ref 0.00–0.07)
Basophils Absolute: 0.1 10*3/uL (ref 0.0–0.1)
Basophils Relative: 1 %
Eosinophils Absolute: 0.3 10*3/uL (ref 0.0–0.5)
Eosinophils Relative: 3 %
HCT: 35.4 % — ABNORMAL LOW (ref 39.0–52.0)
Hemoglobin: 11.7 g/dL — ABNORMAL LOW (ref 13.0–17.0)
Immature Granulocytes: 0 %
Lymphocytes Relative: 29 %
Lymphs Abs: 2.9 10*3/uL (ref 0.7–4.0)
MCH: 30.1 pg (ref 26.0–34.0)
MCHC: 33.1 g/dL (ref 30.0–36.0)
MCV: 91 fL (ref 80.0–100.0)
Monocytes Absolute: 0.9 10*3/uL (ref 0.1–1.0)
Monocytes Relative: 9 %
Neutro Abs: 5.7 10*3/uL (ref 1.7–7.7)
Neutrophils Relative %: 58 %
Platelets: 287 10*3/uL (ref 150–400)
RBC: 3.89 MIL/uL — ABNORMAL LOW (ref 4.22–5.81)
RDW: 14.6 % (ref 11.5–15.5)
WBC: 9.9 10*3/uL (ref 4.0–10.5)
nRBC: 0 % (ref 0.0–0.2)

## 2018-09-15 LAB — GLUCOSE, CAPILLARY
Glucose-Capillary: 111 mg/dL — ABNORMAL HIGH (ref 70–99)
Glucose-Capillary: 101 mg/dL — ABNORMAL HIGH (ref 70–99)
Glucose-Capillary: 129 mg/dL — ABNORMAL HIGH (ref 70–99)

## 2018-09-15 LAB — NM MYOCAR MULTI W/SPECT W/WALL MOTION / EF
Estimated workload: 8.9 METS
Exercise duration (min): 7 min
Exercise duration (sec): 15 s
LV dias vol: 108 mL (ref 62–150)
LV sys vol: 47 mL
MPHR: 148 {beats}/min
Peak HR: 136 {beats}/min
Percent HR: 91 %
Rest HR: 46 {beats}/min
SDS: 4
SRS: 4
SSS: 4
TID: 0.94

## 2018-09-15 MED ORDER — ATORVASTATIN CALCIUM 20 MG PO TABS
80.0000 mg | ORAL_TABLET | Freq: Every day | ORAL | Status: DC
  Administered 2018-09-15: 80 mg via ORAL
  Filled 2018-09-15: qty 4

## 2018-09-15 MED ORDER — ACETAMINOPHEN 650 MG RE SUPP: 650.0000 mg | Freq: Four times a day (QID) | RECTAL | Status: DC | PRN

## 2018-09-15 MED ORDER — ASPIRIN 81 MG PO CHEW
81.0000 mg | CHEWABLE_TABLET | Freq: Every day | ORAL | Status: DC
  Administered 2018-09-15: 81 mg via ORAL
  Filled 2018-09-15: qty 1

## 2018-09-15 MED ORDER — MORPHINE SULFATE (PF) 4 MG/ML IV SOLN
4.0000 mg | Freq: Once | INTRAVENOUS | Status: AC
  Administered 2018-09-15: 4 mg via INTRAVENOUS
  Filled 2018-09-15: qty 1

## 2018-09-15 MED ORDER — OXYCODONE HCL 5 MG PO TABS: 5.0000 mg | ORAL_TABLET | ORAL | Status: DC | PRN

## 2018-09-15 MED ORDER — TECHNETIUM TC 99M TETROFOSMIN IV KIT
31.7800 | PACK | Freq: Once | INTRAVENOUS | Status: AC | PRN
  Administered 2018-09-15: 11:00:00 31.78 via INTRAVENOUS

## 2018-09-15 MED ORDER — ONDANSETRON HCL 4 MG/2ML IJ SOLN
4.0000 mg | Freq: Once | INTRAMUSCULAR | Status: AC
  Administered 2018-09-15: 01:00:00 4 mg via INTRAVENOUS
  Filled 2018-09-15: qty 2

## 2018-09-15 MED ORDER — ONDANSETRON HCL 4 MG/2ML IJ SOLN: 4.0000 mg | Freq: Four times a day (QID) | INTRAMUSCULAR | Status: DC | PRN

## 2018-09-15 MED ORDER — INSULIN ASPART 100 UNIT/ML ~~LOC~~ SOLN: 0.0000 [IU] | Freq: Four times a day (QID) | SUBCUTANEOUS | Status: DC

## 2018-09-15 MED ORDER — TECHNETIUM TC 99M TETROFOSMIN IV KIT
10.7200 | PACK | Freq: Once | INTRAVENOUS | Status: AC | PRN
  Administered 2018-09-15: 10:00:00 10.72 via INTRAVENOUS

## 2018-09-15 MED ORDER — MORPHINE SULFATE (PF) 2 MG/ML IV SOLN: 2.0000 mg | INTRAVENOUS | Status: DC | PRN

## 2018-09-15 MED ORDER — ENOXAPARIN SODIUM 40 MG/0.4ML ~~LOC~~ SOLN: 40.0000 mg | SUBCUTANEOUS | Status: DC

## 2018-09-15 MED ORDER — FAMOTIDINE 20 MG PO TABS
20.0000 mg | ORAL_TABLET | Freq: Two times a day (BID) | ORAL | Status: DC
  Administered 2018-09-15: 20 mg via ORAL
  Filled 2018-09-15: qty 1

## 2018-09-15 MED ORDER — ACETAMINOPHEN 325 MG PO TABS: 650.0000 mg | ORAL_TABLET | Freq: Four times a day (QID) | ORAL | Status: DC | PRN

## 2018-09-15 MED ORDER — NITROGLYCERIN 0.4 MG SL SUBL: 0.4000 mg | SUBLINGUAL_TABLET | SUBLINGUAL | Status: DC | PRN

## 2018-09-15 MED ORDER — ONDANSETRON HCL 4 MG PO TABS: 4.0000 mg | ORAL_TABLET | Freq: Four times a day (QID) | ORAL | Status: DC | PRN

## 2018-09-15 NOTE — Progress Notes (Signed)
Patient Name: Justin Keith Date of Encounter: 09/15/2018  Hospital Problem List     Principal Problem:   Chest pain Active Problems:   HTN (hypertension)   HLD (hyperlipidemia)   GERD (gastroesophageal reflux disease)   CAD (coronary artery disease)   Diabetes [facility])    Patient Profile     Patient admitted with chest pain.  Ruled out for myocardial infarction.  Functional study showed no evidence of ischemia with good heart function.  Subjective   No further chest pain.  Inpatient Medications    . aspirin  81 mg Oral Daily  . atorvastatin  80 mg Oral Daily  . enoxaparin (LOVENOX) injection  40 mg Subcutaneous Q24H  . famotidine  20 mg Oral BID  . insulin aspart  0-9 Units Subcutaneous Q6H    Vital Signs    Vitals:   09/15/18 0200 09/15/18 0235 09/15/18 0236 09/15/18 0802  BP: 120/69 132/77  126/69  Pulse: (!) 51 (!) 51  (!) 43  Resp: 16 (!) 22  16  Temp:  98.4 F (36.9 C)  97.8 F (36.6 C)  TempSrc:  Oral  Oral  SpO2: 95% 100%  98%  Weight:   105 kg   Height:       No intake or output data in the 24 hours ending 09/15/18 1252 Filed Weights   09/15/18 0009 09/15/18 0236  Weight: 102.5 kg 105 kg    Physical Exam    GEN: Well nourished, well developed, in no acute distress.  HEENT: normal.  Neck: Supple, no JVD, carotid bruits, or masses. Cardiac: RRR, no murmurs, rubs, or gallops. No clubbing, cyanosis, edema.  Radials/DP/PT 2+ and equal bilaterally.  Respiratory:  Respirations regular and unlabored, clear to auscultation bilaterally. GI: Soft, nontender, nondistended, BS + x 4. MS: no deformity or atrophy. Skin: warm and dry, no rash. Neuro:  Strength and sensation are intact. Psych: Normal affect.  Labs    CBC Recent Labs    09/15/18 0003  WBC 9.9  NEUTROABS 5.7  HGB 11.7*  HCT 35.4*  MCV 91.0  PLT 540   Basic Metabolic Panel Recent Labs    09/15/18 0003  NA 137  K 4.0  CL 105  CO2 22  GLUCOSE 131*  BUN 19  CREATININE  1.25*  CALCIUM 9.3   Liver Function Tests Recent Labs    09/15/18 0003  AST 19  ALT 16  ALKPHOS 62  BILITOT 0.5  PROT 7.7  ALBUMIN 4.2   No results for input(s): LIPASE, AMYLASE in the last 72 hours. Cardiac Enzymes Recent Labs    09/15/18 0003 09/15/18 0541  TROPONINI <0.03 <0.03   BNP No results for input(s): BNP in the last 72 hours. D-Dimer No results for input(s): DDIMER in the last 72 hours. Hemoglobin A1C No results for input(s): HGBA1C in the last 72 hours. Fasting Lipid Panel No results for input(s): CHOL, HDL, LDLCALC, TRIG, CHOLHDL, LDLDIRECT in the last 72 hours. Thyroid Function Tests No results for input(s): TSH, T4TOTAL, T3FREE, THYROIDAB in the last 72 hours.  Invalid input(s): FREET3  Telemetry    Sinus brady  ECG    Sinus brady  Radiology    Nm Myocar Multi W/spect W/wall Motion / Ef  Result Date: 09/15/2018  The study is normal.  This is a low risk study.  The left ventricular ejection fraction is normal (55-65%).  Blood pressure demonstrated a normal response to exercise.  There was no ST segment deviation noted during  stress.  Negative ett LV function normal Low risk study. No ischemia   Dg Chest Port 1 View  Result Date: 09/15/2018 CLINICAL DATA:  73 y/o  M; chest pain since yesterday. EXAM: PORTABLE CHEST 1 VIEW COMPARISON:  07/25/2017 chest radiograph FINDINGS: Stable cardiac silhouette given projection and technique. Post median sternotomy with wires in alignment. Aortic atherosclerosis with calcification. Clear lungs. No pleural effusion or pneumothorax. No acute osseous abnormality is evident. Right proximal humerus chronic rotator cuff repair postsurgical changes. Osteoarthrosis of the right acromioclavicular joint. IMPRESSION: No active disease. Electronically Signed   By: Kristine Garbe M.D.   On: 09/15/2018 01:13    Assessment & Plan    Coronary artery disease-Myoview completed today with good exercise tolerance.   Had normal chronotropic response to exercise.  No evidence of ST changes with exercise.  LV function and is normal by Myoview.  No evidence of reversible ischemia.  Appears to be stable from a cardiac standpoint with no obvious evidence of progression of coronary disease.  Has a history of a left internal mammary to the LAD, saphenous vein graft OM1, saphenous vein graft to RCA as well as hypertension and hyperlipidemia.  Ruled out for myocardial infarction.  Cardiac cath done in March of last year revealed an EF of 45% with occluded LAD, OM1 and OM 2 and RCA.  He had no significant disease in the circumflex.  The LIMA to the LAD was patent, saphenous vein graft to OM1 and OM 2 as well as RCA were patent.  Based on noninvasive evaluation, does not appear to have progression disease.  Would ambulate and consider discharge on current regimen.  Signed, Javier Docker Sherida Dobkins MD 09/15/2018, 12:52 PM  Pager: [phone]

## 2018-09-15 NOTE — Plan of Care (Signed)
  Problem: Education: Goal: Knowledge of General Education information will improve Description Including pain rating scale, medication(s)/side effects and non-pharmacologic comfort measures Outcome: Progressing   Problem: Health Behavior/Discharge Planning: Goal: Ability to manage health-related needs will improve Outcome: Progressing   Problem: Clinical Measurements: Goal: Ability to maintain clinical measurements within normal limits will improve Outcome: Progressing Goal: Will remain free from infection Outcome: Progressing Note:  Remains afebrile Goal: Diagnostic test results will improve Outcome: Progressing Note:  Troponins negative Goal: Cardiovascular complication will be avoided Outcome: Progressing Note:  No chest pain or arrhythmias over night    Problem: Education: Goal: Understanding of cardiac disease, CV risk reduction, and recovery process will improve Outcome: Progressing   Problem: Cardiac: Goal: Ability to achieve and maintain adequate cardiovascular perfusion will improve Outcome: Progressing Note:  Pt for stress test today

## 2018-09-15 NOTE — Progress Notes (Signed)
Back from nuclear medicine 

## 2018-09-15 NOTE — Consult Note (Signed)
[facility]  CARDIOLOGY CONSULT NOTE  Patient ID: Justin Keith MRN: [id number] DOB/AGE: [birth year] 73 y.o.  Admit date: 09/14/2018 Referring Physician Dr. Lance Keith Primary Physician Justin Keith [facility] Primary Cardiologist Dr. Clayborn Keith Reason for Consultation Chest pain   HPI: Justin Keith is a 73 year old male with a past medical history significant for coronary artery disease s/p CABG with a LIMA to LAD, SVG to OM1, and SVG to distal RCA, type 2 diabetes, hypertension, hyperlipidemia, and obesity who presented to the ED on 09/14/18 with a few hour onset of acute, left-sided chest pain/pressure with associated shortness of breath and diaphoresis.   Workup in the ED included troponin negative x2, chest x-ray negative for acute cardiopulmonary disease, and ECG pending.  Symptoms have improved with administration of nitroglycerin.    Followed in outpatient cardiology with Dr. Clayborn Keith. Underwent cardiac catheterization on 08/07/17 which revealed mildly reduced LV function with an EF around 45% with 100% proximal LAD, occluded OM1 and OM2, occluded ostial RCA, patent circumflex with diffuse 50% with a patent LIMA to mid LAD, patent SVG to OM1, and patent SVG to distal RCA.  Echocardiogram in 2018 revealed mild LV systolic dysfunction with mild TR and trivial PR and MR.   Review of systems complete and found to be negative unless listed above     Past Medical History:  Diagnosis Date  . CAD (coronary artery disease)   . CKD (chronic kidney disease)   . Diabetes mellitus without complication ([place])   . GERD (gastroesophageal reflux disease)   . Hyperlipemia   . Hypertension     Past Surgical History:  Procedure Laterality Date  . CARDIAC SURGERY    . COLONOSCOPY WITH PROPOFOL N/A 02/25/2018   Procedure: COLONOSCOPY WITH PROPOFOL;  Surgeon: Justin Bellows, MD;  Location: [facility] ENDOSCOPY;  Service: Gastroenterology;  Laterality: N/A;  . COLONOSCOPY WITH PROPOFOL N/A 02/26/2018    Procedure: COLONOSCOPY WITH PROPOFOL;  Surgeon: Justin Bellows, MD;  Location: [facility] ENDOSCOPY;  Service: Gastroenterology;  Laterality: N/A;  . FOREIGN BODY REMOVAL N/A 03/07/2018   Procedure: FOREIGN BODY REMOVAL;  Surgeon: Justin Bellows, MD;  Location: [facility] ENDOSCOPY;  Service: Gastroenterology;  Laterality: N/A;  . LEFT HEART CATH AND CORS/GRAFTS ANGIOGRAPHY N/A 08/07/2017   Procedure: LEFT HEART CATH AND CORS/GRAFTS ANGIOGRAPHY;  Surgeon: Justin Kida, MD;  Location: [place] CV LAB;  Service: Cardiovascular;  Laterality: N/A;  . PROSTATE SURGERY    . SHOULDER ARTHROSCOPY      Medications Prior to Admission  Medication Sig Dispense Refill Last Dose  . amLODipine (NORVASC) 10 MG tablet Take 1 tablet (10 mg total) by mouth daily. 30 tablet 0 09/14/2018 at Unknown time  . aspirin 81 MG chewable tablet Chew 1 tablet (81 mg total) by mouth daily. 30 tablet 0 09/14/2018 at Unknown time  . atorvastatin (LIPITOR) 80 MG tablet Take 1 tablet by mouth daily.   09/14/2018 at Unknown time  . lisinopril (PRINIVIL,ZESTRIL) 10 MG tablet Take 1 tablet (10 mg total) by mouth daily. 30 tablet 0 09/14/2018 at Unknown time  . metFORMIN (GLUCOPHAGE-XR) 500 MG 24 hr tablet Take 500 mg by mouth 2 (two) times daily.    09/14/2018 at Unknown time  . nitroGLYCERIN (NITROSTAT) 0.4 MG SL tablet Place 0.4 mg under the tongue every 5 (five) minutes as needed for chest pain.   prn at prn  . ranitidine (ZANTAC) 150 MG tablet Take 150 mg by mouth 2 (two) times daily.    09/14/2018 at  Unknown time   Social History   Socioeconomic History  . Marital status: Single    Spouse name: Not on file  . Number of children: Not on file  . Years of education: Not on file  . Highest education level: Not on file  Occupational History  . Not on file  Social Needs  . Financial resource strain: Not on file  . Food insecurity:    Worry: Not on file    Inability: Not on file  . Transportation needs:    Medical: Not on file     Non-medical: Not on file  Tobacco Use  . Smoking status: Never Smoker  . Smokeless tobacco: Current User  Substance and Sexual Activity  . Alcohol use: No  . Drug use: Not Currently  . Sexual activity: Not on file  Lifestyle  . Physical activity:    Days per week: Not on file    Minutes per session: Not on file  . Stress: Not on file  Relationships  . Social connections:    Talks on phone: Not on file    Gets together: Not on file    Attends religious service: Not on file    Active member of club or organization: Not on file    Attends meetings of clubs or organizations: Not on file    Relationship status: Not on file  . Intimate partner violence:    Fear of current or ex partner: Not on file    Emotionally abused: Not on file    Physically abused: Not on file    Forced sexual activity: Not on file  Other Topics Concern  . Not on file  Social History Narrative  . Not on file    History reviewed. No pertinent family history.    Review of systems complete and found to be negative unless listed above      PHYSICAL EXAM  General: Well developed, well nourished, in no acute distress HEENT:  Normocephalic and atramatic Neck:  No JVD.  Lungs: Clear bilaterally to auscultation and percussion. Heart: Bradycardic, regular rhythm . Normal S1 and S2 without gallops or murmurs.  Abdomen: Bowel sounds are positive, abdomen soft and non-tender  Msk:  Back normal.  Normal strength and tone for age. Extremities: No clubbing, cyanosis or edema.   Neuro: Alert and oriented X 3. Psych:  Good affect, responds appropriately  Labs:   Lab Results  Component Value Date   WBC 9.9 09/15/2018   HGB 11.7 (L) 09/15/2018   HCT 35.4 (L) 09/15/2018   MCV 91.0 09/15/2018   PLT 287 09/15/2018    Recent Labs  Lab 09/15/18 0003  NA 137  K 4.0  CL 105  CO2 22  BUN 19  CREATININE 1.25*  CALCIUM 9.3  PROT 7.7  BILITOT 0.5  ALKPHOS 62  ALT 16  AST 19  GLUCOSE 131*   Lab Results   Component Value Date   CKTOTAL 226 07/25/2017   CKMB 4.8 (H) 09/19/2011   TROPONINI <0.03 09/15/2018    Lab Results  Component Value Date   CHOL 168 09/19/2011   Lab Results  Component Value Date   HDL 39 (L) 09/19/2011   Lab Results  Component Value Date   LDLCALC 91 09/19/2011   Lab Results  Component Value Date   TRIG 192 09/19/2011   No results found for: CHOLHDL No results found for: LDLDIRECT    Radiology: Dg Chest Port 1 View  Result Date: 09/15/2018 CLINICAL DATA:  73 y/o  M; chest pain since yesterday. EXAM: PORTABLE CHEST 1 VIEW COMPARISON:  07/25/2017 chest radiograph FINDINGS: Stable cardiac silhouette given projection and technique. Post median sternotomy with wires in alignment. Aortic atherosclerosis with calcification. Clear lungs. No pleural effusion or pneumothorax. No acute osseous abnormality is evident. Right proximal humerus chronic rotator cuff repair postsurgical changes. Osteoarthrosis of the right acromioclavicular joint. IMPRESSION: No active disease. Electronically Signed   By: Kristine Garbe M.D.   On: 09/15/2018 01:13    EKG: Sinus rhythm, 1st degree AV block  ASSESSMENT AND PLAN:  1.  Chest pain   -Has ruled out, will plan for stress test today   -Echocardiogram pending  2.  Hypertension   -Continue home medication regimen  3.  Type 2 diabetes   -Continue sliding scale insulin  4.  Hypercholesterolemia   -Continue atorvastatin 80mg  daily    The history, physical exam findings, and plan of care were all discussed with Dr. Bartholome Bill, and all decision making was made in collaboration.   Signed: Avie Arenas PA-C 09/15/2018, 7:55 AM

## 2018-09-15 NOTE — Progress Notes (Signed)
To Nuclear medicine via wc

## 2018-09-15 NOTE — ED Notes (Addendum)
ED TO INPATIENT HANDOFF REPORT  ED Nurse Name and Phone #:  Gershon Mussel RN  [phone]  S Name/Age/Gender Justin Keith 73 y.o. male Room/Bed: ED18A/ED18A  Code Status   Code Status: Prior  Home/SNF/Other Home Patient oriented to: self, place, time and situation Is this baseline? Yes   Triage Complete: Triage complete  Chief Complaint Chest Pain  Triage Note Pt arrives to ED from home via [facility] with c/c of L side chest pain x1 day. EMS reports transport vitals of 126/80, p65, NSR, 98% o2 sats on room air. Pt received 2 nitro sprays, and 325mg  chewable aspirin. Pt denies SoB, fever, NVD. Pt A&Ox4, NAD. Dr Beather Arbour at bedside.   Allergies No Known Allergies  Level of Care/Admitting Diagnosis ED Disposition    ED Disposition Condition [facility] Area: [place] [[id number]]  Level of Care: Telemetry [5]  Diagnosis: Chest pain [101751]  Admitting Physician: Lance Coon [[id number]]  Attending Physician: Lance Coon [[id number]]  Bed request comments: 2A - No concern for COVID-19  PT Class (Do Not Modify): Observation [104]  PT Acc Code (Do Not Modify): Observation [10022]       B Medical/Surgery History Past Medical History:  Diagnosis Date  . CAD (coronary artery disease)   . CKD (chronic kidney disease)   . Diabetes mellitus without complication ([place])   . GERD (gastroesophageal reflux disease)   . Hyperlipemia   . Hypertension    Past Surgical History:  Procedure Laterality Date  . CARDIAC SURGERY    . COLONOSCOPY WITH PROPOFOL N/A 02/25/2018   Procedure: COLONOSCOPY WITH PROPOFOL;  Surgeon: Jonathon Bellows, MD;  Location: [facility] ENDOSCOPY;  Service: Gastroenterology;  Laterality: N/A;  . COLONOSCOPY WITH PROPOFOL N/A 02/26/2018   Procedure: COLONOSCOPY WITH PROPOFOL;  Surgeon: Jonathon Bellows, MD;  Location: [facility] ENDOSCOPY;  Service: Gastroenterology;  Laterality: N/A;  . FOREIGN BODY REMOVAL N/A 03/07/2018   Procedure: FOREIGN BODY REMOVAL;   Surgeon: Jonathon Bellows, MD;  Location: [facility] ENDOSCOPY;  Service: Gastroenterology;  Laterality: N/A;  . LEFT HEART CATH AND CORS/GRAFTS ANGIOGRAPHY N/A 08/07/2017   Procedure: LEFT HEART CATH AND CORS/GRAFTS ANGIOGRAPHY;  Surgeon: Yolonda Kida, MD;  Location: [place] CV LAB;  Service: Cardiovascular;  Laterality: N/A;  . PROSTATE SURGERY    . SHOULDER ARTHROSCOPY       A IV Location/Drains/Wounds Patient Lines/Drains/Airways Status   Active Line/Drains/Airways    Name:   Placement date:   Placement time:   Site:   Days:   Peripheral IV 09/15/18 Left Hand   09/15/18    0003    Hand   less than 1          Intake/Output Last 24 hours No intake or output data in the 24 hours ending 09/15/18 0148  Labs/Imaging Results for orders placed or performed during the hospital encounter of 09/14/18 (from the past 48 hour(s))  CBC with Differential     Status: Abnormal   Collection Time: 09/15/18 12:03 AM  Result Value Ref Range   WBC 9.9 4.0 - 10.5 K/uL   RBC 3.89 (L) 4.22 - 5.81 MIL/uL   Hemoglobin 11.7 (L) 13.0 - 17.0 g/dL   HCT 35.4 (L) 39.0 - 52.0 %   MCV 91.0 80.0 - 100.0 fL   MCH 30.1 26.0 - 34.0 pg   MCHC 33.1 30.0 - 36.0 g/dL   RDW 14.6 11.5 - 15.5 %   Platelets 287 150 - 400 K/uL   nRBC 0.0  0.0 - 0.2 %   Neutrophils Relative % 58 %   Neutro Abs 5.7 1.7 - 7.7 K/uL   Lymphocytes Relative 29 %   Lymphs Abs 2.9 0.7 - 4.0 K/uL   Monocytes Relative 9 %   Monocytes Absolute 0.9 0.1 - 1.0 K/uL   Eosinophils Relative 3 %   Eosinophils Absolute 0.3 0.0 - 0.5 K/uL   Basophils Relative 1 %   Basophils Absolute 0.1 0.0 - 0.1 K/uL   Immature Granulocytes 0 %   Abs Immature Granulocytes 0.03 0.00 - 0.07 K/uL    Comment: Performed at [facility], [address] [place], [place] [address]  Comprehensive metabolic panel     Status: Abnormal   Collection Time: 09/15/18 12:03 AM  Result Value Ref Range   Sodium 137 135 - 145 mmol/L   Potassium 4.0 3.5 - 5.1 mmol/L    Chloride 105 98 - 111 mmol/L   CO2 22 22 - 32 mmol/L   Glucose, Bld 131 (H) 70 - 99 mg/dL   BUN 19 8 - 23 mg/dL   Creatinine, Ser 1.25 (H) 0.61 - 1.24 mg/dL   Calcium 9.3 8.9 - 10.3 mg/dL   Total Protein 7.7 6.5 - 8.1 g/dL   Albumin 4.2 3.5 - 5.0 g/dL   AST 19 15 - 41 U/L   ALT 16 0 - 44 U/L   Alkaline Phosphatase 62 38 - 126 U/L   Total Bilirubin 0.5 0.3 - 1.2 mg/dL   GFR calc non Af Amer 57 (L) >60 mL/min   GFR calc Af Amer >60 >60 mL/min   Anion gap 10 5 - 15    Comment: Performed at [facility], [address] [place], [place] [address]  Troponin I - Once     Status: None   Collection Time: 09/15/18 12:03 AM  Result Value Ref Range   Troponin I <0.03 <0.03 ng/mL    Comment: Performed at [facility], [address] [place], [place] [address]   Dg Chest Port 1 View  Result Date: 09/15/2018 CLINICAL DATA:  73 y/o  M; chest pain since yesterday. EXAM: PORTABLE CHEST 1 VIEW COMPARISON:  07/25/2017 chest radiograph FINDINGS: Stable cardiac silhouette given projection and technique. Post median sternotomy with wires in alignment. Aortic atherosclerosis with calcification. Clear lungs. No pleural effusion or pneumothorax. No acute osseous abnormality is evident. Right proximal humerus chronic rotator cuff repair postsurgical changes. Osteoarthrosis of the right acromioclavicular joint. IMPRESSION: No active disease. Electronically Signed   By: Kristine Garbe M.D.   On: 09/15/2018 01:13    Pending Labs [employer] (From admission, onward)    Start     Ordered   Signed and Held  CBC  (enoxaparin (LOVENOX)    CrCl >/= 30 ml/min)  Once,   R    Comments:  Baseline for enoxaparin therapy IF NOT ALREADY DRAWN.  Notify MD if PLT < 100 K.    Signed and Held   Signed and Held  Creatinine, serum  (enoxaparin (LOVENOX)    CrCl >/= 30 ml/min)  Once,   R    Comments:  Baseline for enoxaparin therapy IF NOT ALREADY DRAWN.    Signed and Held   Signed and Held   Creatinine, serum  (enoxaparin (LOVENOX)    CrCl >/= 30 ml/min)  Weekly,   R    Comments:  while on enoxaparin therapy    Signed and Held   Signed and Held  Troponin I - Now  Then Q6H  Now then every 6 hours,   R     Signed and Held          Vitals/Pain Today's Vitals   09/15/18 0007 09/15/18 0008 09/15/18 0009  BP: 120/71    Pulse: 62    Resp: 15    Temp: 99.3 F (37.4 C)    TempSrc: Oral    Weight:   102.5 kg  Height:   5\' 8"  (1.727 m)  PainSc:  6      Isolation Precautions No active isolations  Medications Medications  morphine 4 MG/ML injection 4 mg (4 mg Intravenous Given 09/15/18 0046)  ondansetron (ZOFRAN) injection 4 mg (4 mg Intravenous Given 09/15/18 0046)    Mobility walks Low fall risk   Focused Assessments Cardiac Assessment Handoff:  Cardiac Rhythm: Normal sinus rhythm Lab Results  Component Value Date   CKTOTAL 226 07/25/2017   CKMB 4.8 (H) 09/19/2011   TROPONINI <0.03 09/15/2018   No results found for: DDIMER Does the Patient currently have chest pain? Yes     R Recommendations: See Admitting Provider Note  Report given to:   Yasmin RN on 2A Additional Notes:

## 2018-09-15 NOTE — ED Triage Notes (Signed)
Pt arrives to ED from home via [facility] with c/c of L side chest pain x1 day. EMS reports transport vitals of 126/80, p65, NSR, 98% o2 sats on room air. Pt received 2 nitro sprays, and 325mg  chewable aspirin. Pt denies SoB, fever, NVD. Pt A&Ox4, NAD. Dr Beather Arbour at bedside.

## 2018-09-15 NOTE — H&P (Signed)
[place] [facility] [place] NAME: Justin Keith    MR#:  [id number]  DATE OF BIRTH:  [birth year]  DATE OF ADMISSION:  09/14/2018  PRIMARY CARE PHYSICIAN: [facility] [place]   REQUESTING/REFERRING PHYSICIAN: Beather Arbour, MD  CHIEF COMPLAINT:   Chief Complaint  Patient presents with  . Chest Pain    HISTORY OF PRESENT ILLNESS:  Justin Keith  is a 73 y.o. male who presents with chief complaint as above.  Patient presents to the ED with a complaint of left-sided chest pain.  This is been occurring for the past day or so off and on.  Patient states he has had some associated shortness of breath and some diaphoresis.  He denies any fever, cough, nausea, vomiting, dizziness, headache.  He has a known history of CAD, and is very high risk with comorbid conditions including diabetes, obesity, hypertension, hyperlipidemia.  Initial work-up in the ED is negative, EKG without signs of ischemia and first troponin within normal limits.  Given the patient's high risk, hospitalist were called for admission and further evaluation  PAST MEDICAL HISTORY:   Past Medical History:  Diagnosis Date  . CAD (coronary artery disease)   . CKD (chronic kidney disease)   . Diabetes mellitus without complication ([place])   . GERD (gastroesophageal reflux disease)   . Hyperlipemia   . Hypertension      PAST SURGICAL HISTORY:   Past Surgical History:  Procedure Laterality Date  . CARDIAC SURGERY    . COLONOSCOPY WITH PROPOFOL N/A 02/25/2018   Procedure: COLONOSCOPY WITH PROPOFOL;  Surgeon: Jonathon Bellows, MD;  Location: [facility] ENDOSCOPY;  Service: Gastroenterology;  Laterality: N/A;  . COLONOSCOPY WITH PROPOFOL N/A 02/26/2018   Procedure: COLONOSCOPY WITH PROPOFOL;  Surgeon: Jonathon Bellows, MD;  Location: [facility] ENDOSCOPY;  Service: Gastroenterology;  Laterality: N/A;  . FOREIGN BODY REMOVAL N/A 03/07/2018   Procedure: FOREIGN BODY REMOVAL;  Surgeon: Jonathon Bellows,  MD;  Location: [facility] ENDOSCOPY;  Service: Gastroenterology;  Laterality: N/A;  . LEFT HEART CATH AND CORS/GRAFTS ANGIOGRAPHY N/A 08/07/2017   Procedure: LEFT HEART CATH AND CORS/GRAFTS ANGIOGRAPHY;  Surgeon: Yolonda Kida, MD;  Location: [place] CV LAB;  Service: Cardiovascular;  Laterality: N/A;  . PROSTATE SURGERY    . SHOULDER ARTHROSCOPY       SOCIAL HISTORY:   Social History   Tobacco Use  . Smoking status: Never Smoker  . Smokeless tobacco: Current User  Substance Use Topics  . Alcohol use: No     FAMILY HISTORY:    Family history reviewed and is non-contributory DRUG ALLERGIES:  No Known Allergies  MEDICATIONS AT HOME:   Prior to Admission medications   Medication Sig Start Date End Date Taking? Authorizing Provider  amLODipine (NORVASC) 10 MG tablet Take 1 tablet (10 mg total) by mouth daily. 12/08/15  Yes Vaughan Basta, MD  aspirin 81 MG chewable tablet Chew 1 tablet (81 mg total) by mouth daily. 12/08/15  Yes Vaughan Basta, MD  atorvastatin (LIPITOR) 80 MG tablet Take 1 tablet by mouth daily.   Yes [provider]  lisinopril (PRINIVIL,ZESTRIL) 10 MG tablet Take 1 tablet (10 mg total) by mouth daily. 12/08/15  Yes Vaughan Basta, MD  metFORMIN (GLUCOPHAGE-XR) 500 MG 24 hr tablet Take 500 mg by mouth 2 (two) times daily.    Yes [provider]  nitroGLYCERIN (NITROSTAT) 0.4 MG SL tablet Place 0.4 mg under the tongue every 5 (five) minutes as needed for chest pain.  Yes [provider]  ranitidine (ZANTAC) 150 MG tablet Take 150 mg by mouth 2 (two) times daily.    Yes [provider]    REVIEW OF SYSTEMS:  Review of Systems  Constitutional: Positive for diaphoresis. Negative for chills, fever, malaise/fatigue and weight loss.  HENT: Negative for ear pain, hearing loss and tinnitus.   Eyes: Negative for blurred vision, double vision, pain and redness.  Respiratory: Positive for shortness of breath.  Negative for cough and hemoptysis.   Cardiovascular: Positive for chest pain. Negative for palpitations, orthopnea and leg swelling.  Gastrointestinal: Negative for abdominal pain, constipation, diarrhea, nausea and vomiting.  Genitourinary: Negative for dysuria, frequency and hematuria.  Musculoskeletal: Negative for back pain, joint pain and neck pain.  Skin:       No acne, rash, or lesions  Neurological: Negative for dizziness, tremors, focal weakness and weakness.  Endo/Heme/Allergies: Negative for polydipsia. Does not bruise/bleed easily.  Psychiatric/Behavioral: Negative for depression. The patient is not nervous/anxious and does not have insomnia.      VITAL SIGNS:   Vitals:   09/15/18 0007 09/15/18 0009  BP: 120/71   Pulse: 62   Resp: 15   Temp: 99.3 F (37.4 C)   TempSrc: Oral   Weight:  102.5 kg  Height:  5\' 8"  (1.727 m)   Wt Readings from Last 3 Encounters:  09/15/18 102.5 kg  03/07/18 102.1 kg  02/25/18 102.5 kg    PHYSICAL EXAMINATION:  Physical Exam  Vitals reviewed. Constitutional: He is oriented to person, place, and time. He appears well-developed and well-nourished. No distress.  HENT:  Head: Normocephalic and atraumatic.  Mouth/Throat: Oropharynx is clear and moist.  Eyes: Pupils are equal, round, and reactive to light. Conjunctivae and EOM are normal. No scleral icterus.  Neck: Normal range of motion. Neck supple. No JVD present. No thyromegaly present.  Cardiovascular: Normal rate, regular rhythm and intact distal pulses. Exam reveals no gallop and no friction rub.  No murmur heard. Respiratory: Effort normal and breath sounds normal. No respiratory distress. He has no wheezes. He has no rales.  GI: Soft. Bowel sounds are normal. He exhibits no distension. There is no abdominal tenderness.  Musculoskeletal: Normal range of motion.        General: No edema.     Comments: No arthritis, no gout  Lymphadenopathy:    He has no cervical adenopathy.   Neurological: He is alert and oriented to person, place, and time. No cranial nerve deficit.  No dysarthria, no aphasia  Skin: Skin is warm and dry. No rash noted. No erythema.  Psychiatric: He has a normal mood and affect. His behavior is normal. Judgment and thought content normal.    LABORATORY PANEL:   CBC Recent Labs  Lab 09/15/18 0003  WBC 9.9  HGB 11.7*  HCT 35.4*  PLT 287   ------------------------------------------------------------------------------------------------------------------  Chemistries  Recent Labs  Lab 09/15/18 0003  NA 137  K 4.0  CL 105  CO2 22  GLUCOSE 131*  BUN 19  CREATININE 1.25*  CALCIUM 9.3  AST 19  ALT 16  ALKPHOS 62  BILITOT 0.5   ------------------------------------------------------------------------------------------------------------------  Cardiac Enzymes Recent Labs  Lab 09/15/18 0003  TROPONINI <0.03   ------------------------------------------------------------------------------------------------------------------  RADIOLOGY:  Dg Chest Port 1 View  Result Date: 09/15/2018 CLINICAL DATA:  73 y/o  M; chest pain since yesterday. EXAM: PORTABLE CHEST 1 VIEW COMPARISON:  07/25/2017 chest radiograph FINDINGS: Stable cardiac silhouette given projection and technique. Post median sternotomy with wires  in alignment. Aortic atherosclerosis with calcification. Clear lungs. No pleural effusion or pneumothorax. No acute osseous abnormality is evident. Right proximal humerus chronic rotator cuff repair postsurgical changes. Osteoarthrosis of the right acromioclavicular joint. IMPRESSION: No active disease. Electronically Signed   By: Kristine Garbe M.D.   On: 09/15/2018 01:13    EKG:   Orders placed or performed during the hospital encounter of 09/14/18  . ED EKG  . ED EKG    IMPRESSION AND PLAN:  Principal Problem:   Chest pain -patient states his pain is much improved.  He states it was at about a 10 earlier today  at its worst, it is now down to about a 4.  However, it is persistent.  Initial work-up is reassuring.  Trend troponins tonight, get echocardiogram and cardiology consult Active Problems:   HTN (hypertension) -home dose antihypertensives   CAD (coronary artery disease) -continue home meds, other work-up as above   Diabetes (HCC) -sliding scale insulin coverage   HLD (hyperlipidemia) -home dose antilipid   GERD (gastroesophageal reflux disease) -home dose H2 blocker  Chart review performed and case discussed with ED provider. Labs, imaging and/or ECG reviewed by provider and discussed with patient/family. Management plans discussed with the patient and/or family.  DVT PROPHYLAXIS: SubQ lovenox   GI PROPHYLAXIS:  H2 blocker  ADMISSION STATUS: Observation  CODE STATUS: Full Code Status History    Date Active Date Inactive Code Status Order ID Comments User Context   08/07/2017 1309 08/07/2017 1734 Full Code [id number]  Yolonda Kida, MD Inpatient   12/07/2015 1430 12/08/2015 1510 Full Code [id number]  Epifanio Lesches, MD ED      TOTAL TIME TAKING CARE OF THIS PATIENT: 40 minutes.   Ethlyn Daniels 09/15/2018, 1:30 AM  [facility]  Office  [phone]  CC: Primary care physician; [facility] [place]  Note:  This document was prepared using Systems analyst and may include unintentional dictation errors.

## 2018-09-15 NOTE — Progress Notes (Signed)
Pt discharged to home via wc.  Instructions  given to pt.  Questions answered.  No distress.  

## 2018-09-20 NOTE — Discharge Summary (Signed)
Justin Keith at [place] NAME: Justin Keith    MR#:  [id number]  DATE OF BIRTH:  02-11-1946  DATE OF ADMISSION:  09/14/2018 ADMITTING PHYSICIAN: Lance Coon, MD  DATE OF DISCHARGE: 09/15/2018  2:38 PM  PRIMARY CARE PHYSICIAN: [facility] [place] DIAGNOSIS:  Unstable angina ([place]) [I20.0] Chest pain, unspecified type [R07.9]  DISCHARGE DIAGNOSIS:  Principal Problem:   Chest pain Active Problems:   HTN (hypertension)   HLD (hyperlipidemia)   GERD (gastroesophageal reflux disease)   CAD (coronary artery disease)   Diabetes ([place])   SECONDARY DIAGNOSIS:   Past Medical History:  Diagnosis Date  . CAD (coronary artery disease)   . CKD (chronic kidney disease)   . Diabetes mellitus without complication ([place])   . GERD (gastroesophageal reflux disease)   . Hyperlipemia   . Hypertension      ADMITTING HISTORY  HISTORY OF PRESENT ILLNESS:  Justin Keith  is a 73 y.o. male who presents with chief complaint as above.  Patient presents to the ED with a complaint of left-sided chest pain.  This is been occurring for the past day or so off and on.  Patient states he has had some associated shortness of breath and some diaphoresis.  He denies any fever, cough, nausea, vomiting, dizziness, headache.  He has a known history of CAD, and is very high risk with comorbid conditions including diabetes, obesity, hypertension, hyperlipidemia.  Initial work-up in the ED is negative, EKG without signs of ischemia and first troponin within normal limits.  Given the patient's high risk, hospitalist were called for admission and further evaluation  HOSPITAL COURSE:   *Atypical chest pain.  Patient was admitted to telemetry floor and troponins checked.  This was stable.  Telemetry showed no arrhythmias.  EKG with no acute changes.  Cardiology consulted for the input and stress test was scheduled.  Stress test showed no acute changes.   Cardiology suggested ambulating the patient and patient ambulated well with no chest pain.  He was discharged home to follow-up outpatient with his cardiologist.  On appropriate medications at discharge.  Other comorbidities including hypertension, diabetes, hyperlipidemia, GERD stable.  Patient chest pain-free at discharge.  Discharged home in stable condition.  CONSULTS OBTAINED:  Treatment Team:  Teodoro Spray, MD  DRUG ALLERGIES:  No Known Allergies  DISCHARGE MEDICATIONS:   Allergies as of 09/15/2018   No Known Allergies     Medication List    TAKE these medications   amLODipine 10 MG tablet Commonly known as:  NORVASC Take 1 tablet (10 mg total) by mouth daily.   aspirin 81 MG chewable tablet Chew 1 tablet (81 mg total) by mouth daily.   atorvastatin 80 MG tablet Commonly known as:  LIPITOR Take 1 tablet by mouth daily.   lisinopril 10 MG tablet Commonly known as:  ZESTRIL Take 1 tablet (10 mg total) by mouth daily.   metFORMIN 500 MG 24 hr tablet Commonly known as:  GLUCOPHAGE-XR Take 500 mg by mouth 2 (two) times daily.   nitroGLYCERIN 0.4 MG SL tablet Commonly known as:  NITROSTAT Place 0.4 mg under the tongue every 5 (five) minutes as needed for chest pain.   ranitidine 150 MG tablet Commonly known as:  ZANTAC Take 150 mg by mouth 2 (two) times daily.       Today   VITAL SIGNS:  Blood pressure 126/69, pulse (!) 43, temperature 97.8 F (36.6 C), temperature source  Oral, resp. rate 16, height 5\' 8"  (1.727 m), weight 105 kg, SpO2 98 %.  I/O:  No intake or output data in the 24 hours ending 09/20/18 1303  PHYSICAL EXAMINATION:  Physical Exam  GENERAL:  73 y.o.-year-old patient lying in the bed with no acute distress.  LUNGS: Normal breath sounds bilaterally, no wheezing, rales,rhonchi or crepitation. No use of accessory muscles of respiration.  CARDIOVASCULAR: S1, S2 normal. No murmurs, rubs, or gallops.  ABDOMEN: Soft, non-tender,  non-distended. Bowel sounds present. No organomegaly or mass.  NEUROLOGIC: Moves all 4 extremities. PSYCHIATRIC: The patient is alert and oriented x 3.  SKIN: No obvious rash, lesion, or ulcer.   DATA REVIEW:   CBC Recent Labs  Lab 09/15/18 0003  WBC 9.9  HGB 11.7*  HCT 35.4*  PLT 287    Chemistries  Recent Labs  Lab 09/15/18 0003  NA 137  K 4.0  CL 105  CO2 22  GLUCOSE 131*  BUN 19  CREATININE 1.25*  CALCIUM 9.3  AST 19  ALT 16  ALKPHOS 62  BILITOT 0.5    Cardiac Enzymes Recent Labs  Lab 09/15/18 1214  [place] <0.03    Microbiology Results  No results found for this or any previous visit.  RADIOLOGY:  No results found.  Follow up with PCP in 1 week.  Management plans discussed with the patient, family and they are in agreement.  CODE STATUS:  Code Status History    Date Active Date Inactive Code Status Order ID Comments User Context   09/15/2018 0220 09/15/2018 1744 Full Code [id number]  Lance Coon, MD ED   08/07/2017 1309 08/07/2017 1734 Full Code [id number]  Yolonda Kida, MD Inpatient   12/07/2015 1430 12/08/2015 1510 Full Code [id number]  Epifanio Lesches, MD ED      TOTAL TIME TAKING CARE OF THIS PATIENT ON DAY OF DISCHARGE: more than 30 minutes.   Leia Alf Janeisha Ryle M.D on 09/20/2018 at 1:03 PM  Between 7am to 6pm - Pager - [phone]  After 6pm go to [email or web address] - password [email or web address] [place] [facility]  Office  [phone]  CC: Primary care physician; [facility] [place]  Note: This dictation was prepared with Diplomatic Services operational officer dictation along with smaller phrase technology. Any transcriptional errors that result from this process are unintentional.

## 2018-10-19 ENCOUNTER — Encounter: Payer: Self-pay | Admitting: Emergency Medicine

## 2018-10-19 ENCOUNTER — Other Ambulatory Visit: Payer: Self-pay

## 2018-10-19 ENCOUNTER — Emergency Department
Admission: EM | Admit: 2018-10-19 | Discharge: 2018-10-19 | Disposition: A | Discharge status: Home / Self Care | Payer: Medicare HMO | Attending: Emergency Medicine | Admitting: Emergency Medicine

## 2018-10-19 ENCOUNTER — Emergency Department: Payer: Medicare HMO

## 2018-10-19 DIAGNOSIS — Z7982 Long term (current) use of aspirin: Secondary | ICD-10-CM | POA: Diagnosis not present

## 2018-10-19 DIAGNOSIS — R059 Cough, unspecified: Secondary | ICD-10-CM

## 2018-10-19 DIAGNOSIS — R05 Cough: Secondary | ICD-10-CM

## 2018-10-19 DIAGNOSIS — Z20828 Contact with and (suspected) exposure to other viral communicable diseases: Secondary | ICD-10-CM | POA: Insufficient documentation

## 2018-10-19 DIAGNOSIS — Z951 Presence of aortocoronary bypass graft: Secondary | ICD-10-CM | POA: Diagnosis not present

## 2018-10-19 DIAGNOSIS — R0602 Shortness of breath: Secondary | ICD-10-CM | POA: Insufficient documentation

## 2018-10-19 DIAGNOSIS — N189 Chronic kidney disease, unspecified: Secondary | ICD-10-CM | POA: Insufficient documentation

## 2018-10-19 DIAGNOSIS — R079 Chest pain, unspecified: Secondary | ICD-10-CM | POA: Diagnosis present

## 2018-10-19 DIAGNOSIS — I2510 Atherosclerotic heart disease of native coronary artery without angina pectoris: Secondary | ICD-10-CM | POA: Insufficient documentation

## 2018-10-19 DIAGNOSIS — Z79899 Other long term (current) drug therapy: Secondary | ICD-10-CM | POA: Insufficient documentation

## 2018-10-19 DIAGNOSIS — I129 Hypertensive chronic kidney disease with stage 1 through stage 4 chronic kidney disease, or unspecified chronic kidney disease: Secondary | ICD-10-CM | POA: Diagnosis not present

## 2018-10-19 DIAGNOSIS — R072 Precordial pain: Secondary | ICD-10-CM

## 2018-10-19 LAB — BASIC METABOLIC PANEL
Anion gap: 8 (ref 5–15)
BUN: 25 mg/dL — ABNORMAL HIGH (ref 8–23)
CO2: 25 mmol/L (ref 22–32)
Calcium: 9.6 mg/dL (ref 8.9–10.3)
Chloride: 107 mmol/L (ref 98–111)
Creatinine, Ser: 1.48 mg/dL — ABNORMAL HIGH (ref 0.61–1.24)
GFR calc Af Amer: 54 mL/min — ABNORMAL LOW (ref >60)
GFR calc non Af Amer: 47 mL/min — ABNORMAL LOW (ref >60)
Glucose, Bld: 108 mg/dL — ABNORMAL HIGH (ref 70–99)
Potassium: 5.1 mmol/L (ref 3.5–5.1)
Sodium: 140 mmol/L (ref 135–145)

## 2018-10-19 LAB — CBC
HCT: 36.8 % — ABNORMAL LOW (ref 39.0–52.0)
Hemoglobin: 11.8 g/dL — ABNORMAL LOW (ref 13.0–17.0)
MCH: 29.6 pg (ref 26.0–34.0)
MCHC: 32.1 g/dL (ref 30.0–36.0)
MCV: 92.2 fL (ref 80.0–100.0)
Platelets: 326 10*3/uL (ref 150–400)
RBC: 3.99 MIL/uL — ABNORMAL LOW (ref 4.22–5.81)
RDW: 14.5 % (ref 11.5–15.5)
WBC: 10.8 10*3/uL — ABNORMAL HIGH (ref 4.0–10.5)
nRBC: 0 % (ref 0.0–0.2)

## 2018-10-19 LAB — SARS CORONAVIRUS 2 BY RT PCR (HOSPITAL ORDER, PERFORMED IN ~~LOC~~ HOSPITAL LAB): SARS Coronavirus 2: NEGATIVE

## 2018-10-19 LAB — TROPONIN I
Troponin I: <0.03 ng/mL (ref <0.03)
Troponin I: <0.03 ng/mL (ref <0.03)

## 2018-10-19 NOTE — Discharge Instructions (Addendum)
The tests we did today look okay.  Please return for worse pain or any other problems even if they happen tonight.  Please call Dr. Clayborn Bigness or Dr. Nehemiah Massed tomorrow morning and schedule follow-up appointment.  They should be able to see you tomorrow.

## 2018-10-19 NOTE — ED Triage Notes (Signed)
Patient to ER for c/o chest pain to left side. Denies any shortness of breath, nausea, or radiating pain. Patient has h/o similar pain.

## 2018-10-19 NOTE — ED Notes (Addendum)
Pt st took nitroglycerin at home an 324mg  ASA at home which alleviated pt's CP at home. . NAd noted at this time

## 2018-10-19 NOTE — ED Notes (Signed)
Pt given water to drink per request.

## 2018-10-19 NOTE — ED Notes (Signed)
Pt reports improvement to chest pain. Intermittently now at worst.

## 2018-10-19 NOTE — ED Provider Notes (Signed)
[facility] Emergency Department Provider Note   ____________________________________________   First MD Initiated Contact with Patient 10/19/18 1753     (approximate)  I have reviewed the triage vital signs and the nursing notes.   HISTORY  Chief Complaint Chest Pain    HPI NOLAND PIZANO is a 73 y.o. male complains of chest pain and pressure starting at about 1030 today.  He took some nitroglycerin and aspirin and he feels better although is still intermittently briefly squeezing and letting go.  The pain felt like his usual heart pain.  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  Patient reports shortness of breath with exertion but this is unchanged.  He had no nausea or sweating with the pain.  Pain did not radiate.         Past Medical History:  Diagnosis Date  . CAD (coronary artery disease)   . CKD (chronic kidney disease)   . Diabetes mellitus without complication ([place])   . GERD (gastroesophageal reflux disease)   . Hyperlipemia   . Hypertension     Patient Active Problem List   Diagnosis Date Noted  . HTN (hypertension) 09/15/2018  . HLD (hyperlipidemia) 09/15/2018  . GERD (gastroesophageal reflux disease) 09/15/2018  . CAD (coronary artery disease) 09/15/2018  . Diabetes ([place]) 09/15/2018  . Chest pain 12/07/2015    Past Surgical History:  Procedure Laterality Date  . CARDIAC SURGERY    . COLONOSCOPY WITH PROPOFOL N/A 02/25/2018   Procedure: COLONOSCOPY WITH PROPOFOL;  Surgeon: Jonathon Bellows, MD;  Location: [facility] ENDOSCOPY;  Service: Gastroenterology;  Laterality: N/A;  . COLONOSCOPY WITH PROPOFOL N/A 02/26/2018   Procedure: COLONOSCOPY WITH PROPOFOL;  Surgeon: Jonathon Bellows, MD;  Location: [facility] ENDOSCOPY;  Service: Gastroenterology;  Laterality: N/A;  . FOREIGN BODY REMOVAL N/A 03/07/2018   Procedure: FOREIGN BODY REMOVAL;  Surgeon: Jonathon Bellows, MD;  Location: [facility] ENDOSCOPY;  Service: Gastroenterology;  Laterality: N/A;  . LEFT HEART CATH AND  CORS/GRAFTS ANGIOGRAPHY N/A 08/07/2017   Procedure: LEFT HEART CATH AND CORS/GRAFTS ANGIOGRAPHY;  Surgeon: Yolonda Kida, MD;  Location: [place] CV LAB;  Service: Cardiovascular;  Laterality: N/A;  . PROSTATE SURGERY    . SHOULDER ARTHROSCOPY      Prior to Admission medications   Medication Sig Start Date End Date Taking? Authorizing Provider  amLODipine (NORVASC) 10 MG tablet Take 1 tablet (10 mg total) by mouth daily. 12/08/15  Yes Vaughan Basta, MD  aspirin 81 MG chewable tablet Chew 1 tablet (81 mg total) by mouth daily. 12/08/15  Yes Vaughan Basta, MD  atorvastatin (LIPITOR) 80 MG tablet Take 1 tablet by mouth daily.   Yes [provider]  lisinopril (PRINIVIL,ZESTRIL) 10 MG tablet Take 1 tablet (10 mg total) by mouth daily. 12/08/15  Yes Vaughan Basta, MD  metFORMIN (GLUCOPHAGE-XR) 500 MG 24 hr tablet Take 500 mg by mouth 2 (two) times daily.    Yes [provider]  nitroGLYCERIN (NITROSTAT) 0.4 MG SL tablet Place 0.4 mg under the tongue every 5 (five) minutes as needed for chest pain.   Yes [provider]  ranitidine (ZANTAC) 150 MG tablet Take 150 mg by mouth 2 (two) times daily.    Yes [provider]    Allergies Patient has no known allergies.  No family history on file.  Social History Social History   Tobacco Use  . Smoking status: Never Smoker  . Smokeless tobacco: Current User  Substance Use Topics  . Alcohol use: No  .  Drug use: Not Currently    Review of Systems  Constitutional: No fever/chills Eyes: No visual changes. ENT: No sore throat. Cardiovascular: See HPI Respiratory: See HPI Gastrointestinal: No abdominal pain.  No nausea, no vomiting.  No diarrhea.  No constipation. Genitourinary: Negative for dysuria. Musculoskeletal: Negative for back pain. Skin: Negative for rash. Neurological: Negative for headaches, focal weakness  ____________________________________________   PHYSICAL  EXAM:  VITAL SIGNS: ED Triage Vitals  Enc Vitals Group     BP 10/19/18 1752 133/72     Pulse Rate 10/19/18 1752 63     Resp 10/19/18 1752 20     Temp 10/19/18 1752 99.1 F (37.3 C)     Temp Source 10/19/18 1752 Oral     SpO2 10/19/18 1752 100 %     Weight 10/19/18 1753 240 lb (108.9 kg)     Height 10/19/18 1753 5\' 8"  (1.727 m)     Head Circumference --      Peak Flow --      Pain Score 10/19/18 1753 6     Pain Loc --      Pain Edu? --      Excl. in [place]? --     Constitutional: Alert and oriented. Well appearing and in no acute distress. Eyes: Conjunctivae are normal.  Head: Atraumatic. Nose: No congestion/rhinnorhea. Mouth/Throat: Mucous membranes are moist.  Oropharynx non-erythematous. Neck: No stridor.   Cardiovascular: Normal rate, regular rhythm. Grossly normal heart sounds.  Good peripheral circulation. Respiratory: Normal respiratory effort.  No retractions. Lungs CTAB. Gastrointestinal: Soft and nontender. No distention. No abdominal bruits. No CVA tenderness. Musculoskeletal: No lower extremity tenderness nor edema. Neurologic:  Normal speech and language. No gross focal neurologic deficits are appreciated.  Skin:  Skin is warm, dry and intact. No rash noted.   ____________________________________________   LABS (all labs ordered are listed, but only abnormal results are displayed)  Labs Reviewed  BASIC METABOLIC PANEL - Abnormal; Notable for the following components:      Result Value   Glucose, Bld 108 (*)    BUN 25 (*)    Creatinine, Ser 1.48 (*)    GFR calc non Af Amer 47 (*)    GFR calc Af Amer 54 (*)    All other components within normal limits  CBC - Abnormal; Notable for the following components:   WBC 10.8 (*)    RBC 3.99 (*)    Hemoglobin 11.8 (*)    HCT 36.8 (*)    All other components within normal limits  SARS CORONAVIRUS 2 (HOSPITAL ORDER, [place] [facility])  TROPONIN I  TROPONIN I    ____________________________________________  EKG  EKG read and interpreted by me shows normal sinus rhythm rate of 63 normal axis nonspecific ST-T wave changes that were present also in December 07, 2015 ____________________________________________  RADIOLOGY  ED MD interpretation: Chest x-ray read by radiology reviewed by me shows no acute pathology  Official radiology report(s): Dg Chest 1 View  Result Date: 10/19/2018 CLINICAL DATA:  Chest pain EXAM: CHEST  1 VIEW COMPARISON:  09/15/2018 FINDINGS: The cardiac silhouette is mildly enlarged. The patient is status post prior median sternotomy. The lungs are essentially clear. There is no pneumothorax. No large pleural effusion. IMPRESSION: No active disease. Electronically Signed   By: Constance Holster M.D.   On: 10/19/2018 18:34    ____________________________________________   PROCEDURES  Procedure(s) performed (including Critical Care):  Procedures   ____________________________________________   INITIAL IMPRESSION / ASSESSMENT  AND PLAN / ED COURSE  Patient just had a Myoview stress test done on the 15th of last month that was low risk with no changes during exercise Discussed patient with Dr. Nehemiah Massed.  Both of his troponins are negative EKGs look okay chest x-ray is negative they will be able to follow him up tomorrow. Well.             ____________________________________________   FINAL CLINICAL IMPRESSION(S) / ED DIAGNOSES  Final diagnoses:  Precordial pain     ED Discharge Orders    None       Note:  This document was prepared using Dragon voice recognition software and may include unintentional dictation errors.    Nena Polio, MD 10/19/18 2216

## 2018-10-19 NOTE — ED Notes (Signed)
ED Provider [place]  at bedside. 

## 2018-10-22 ENCOUNTER — Emergency Department: Payer: Medicare HMO

## 2018-10-22 ENCOUNTER — Observation Stay
Admission: EM | Admit: 2018-10-22 | Discharge: 2018-10-22 | Disposition: A | Discharge status: Home / Self Care | Payer: Medicare HMO | Attending: Internal Medicine | Admitting: Internal Medicine

## 2018-10-22 ENCOUNTER — Other Ambulatory Visit: Payer: Self-pay

## 2018-10-22 DIAGNOSIS — R0789 Other chest pain: Secondary | ICD-10-CM | POA: Diagnosis not present

## 2018-10-22 DIAGNOSIS — Z79899 Other long term (current) drug therapy: Secondary | ICD-10-CM | POA: Diagnosis not present

## 2018-10-22 DIAGNOSIS — Z794 Long term (current) use of insulin: Secondary | ICD-10-CM | POA: Insufficient documentation

## 2018-10-22 DIAGNOSIS — Z7982 Long term (current) use of aspirin: Secondary | ICD-10-CM | POA: Diagnosis not present

## 2018-10-22 DIAGNOSIS — N189 Chronic kidney disease, unspecified: Secondary | ICD-10-CM | POA: Insufficient documentation

## 2018-10-22 DIAGNOSIS — E785 Hyperlipidemia, unspecified: Secondary | ICD-10-CM | POA: Diagnosis not present

## 2018-10-22 DIAGNOSIS — I2510 Atherosclerotic heart disease of native coronary artery without angina pectoris: Secondary | ICD-10-CM | POA: Insufficient documentation

## 2018-10-22 DIAGNOSIS — E1122 Type 2 diabetes mellitus with diabetic chronic kidney disease: Secondary | ICD-10-CM | POA: Insufficient documentation

## 2018-10-22 DIAGNOSIS — R079 Chest pain, unspecified: Secondary | ICD-10-CM | POA: Diagnosis present

## 2018-10-22 DIAGNOSIS — Z951 Presence of aortocoronary bypass graft: Secondary | ICD-10-CM | POA: Diagnosis not present

## 2018-10-22 DIAGNOSIS — Z1159 Encounter for screening for other viral diseases: Secondary | ICD-10-CM | POA: Diagnosis not present

## 2018-10-22 DIAGNOSIS — K219 Gastro-esophageal reflux disease without esophagitis: Secondary | ICD-10-CM | POA: Diagnosis not present

## 2018-10-22 DIAGNOSIS — I129 Hypertensive chronic kidney disease with stage 1 through stage 4 chronic kidney disease, or unspecified chronic kidney disease: Secondary | ICD-10-CM | POA: Diagnosis not present

## 2018-10-22 LAB — SARS CORONAVIRUS 2 BY RT PCR (HOSPITAL ORDER, PERFORMED IN ~~LOC~~ HOSPITAL LAB): SARS Coronavirus 2: NEGATIVE

## 2018-10-22 LAB — CBC
HCT: 35.9 % — ABNORMAL LOW (ref 39.0–52.0)
Hemoglobin: 11.8 g/dL — ABNORMAL LOW (ref 13.0–17.0)
MCH: 29.6 pg (ref 26.0–34.0)
MCHC: 32.9 g/dL (ref 30.0–36.0)
MCV: 90.2 fL (ref 80.0–100.0)
Platelets: 317 10*3/uL (ref 150–400)
RBC: 3.98 MIL/uL — ABNORMAL LOW (ref 4.22–5.81)
RDW: 14.2 % (ref 11.5–15.5)
WBC: 10.3 10*3/uL (ref 4.0–10.5)
nRBC: 0 % (ref 0.0–0.2)

## 2018-10-22 LAB — BASIC METABOLIC PANEL
Anion gap: 8 (ref 5–15)
BUN: 20 mg/dL (ref 8–23)
CO2: 24 mmol/L (ref 22–32)
Calcium: 9.5 mg/dL (ref 8.9–10.3)
Chloride: 104 mmol/L (ref 98–111)
Creatinine, Ser: 1.46 mg/dL — ABNORMAL HIGH (ref 0.61–1.24)
GFR calc Af Amer: 55 mL/min — ABNORMAL LOW (ref >60)
GFR calc non Af Amer: 47 mL/min — ABNORMAL LOW (ref >60)
Glucose, Bld: 118 mg/dL — ABNORMAL HIGH (ref 70–99)
Potassium: 4.9 mmol/L (ref 3.5–5.1)
Sodium: 136 mmol/L (ref 135–145)

## 2018-10-22 LAB — TROPONIN I
Troponin I: <0.03 ng/mL (ref <0.03)
Troponin I: <0.03 ng/mL (ref <0.03)
Troponin I: <0.03 ng/mL (ref <0.03)

## 2018-10-22 MED ORDER — LISINOPRIL 10 MG PO TABS
10.0000 mg | ORAL_TABLET | Freq: Every day | ORAL | Status: DC
  Administered 2018-10-22: 10 mg via ORAL
  Filled 2018-10-22: qty 1

## 2018-10-22 MED ORDER — NITROGLYCERIN 0.4 MG SL SUBL: 0.4000 mg | SUBLINGUAL_TABLET | SUBLINGUAL | Status: DC | PRN

## 2018-10-22 MED ORDER — LIDOCAINE VISCOUS HCL 2 % MT SOLN
15.0000 mL | Freq: Once | OROMUCOSAL | Status: AC
  Administered 2018-10-22: 15 mL via ORAL
  Filled 2018-10-22: qty 15

## 2018-10-22 MED ORDER — FENTANYL CITRATE (PF) 100 MCG/2ML IJ SOLN
50.0000 ug | Freq: Once | INTRAMUSCULAR | Status: AC
  Administered 2018-10-22: 50 ug via INTRAVENOUS
  Filled 2018-10-22: qty 2

## 2018-10-22 MED ORDER — MORPHINE SULFATE (PF) 2 MG/ML IV SOLN: 2.0000 mg | INTRAVENOUS | Status: DC | PRN

## 2018-10-22 MED ORDER — ENOXAPARIN SODIUM 40 MG/0.4ML ~~LOC~~ SOLN
40.0000 mg | SUBCUTANEOUS | Status: DC
  Administered 2018-10-22: 40 mg via SUBCUTANEOUS
  Filled 2018-10-22: qty 0.4

## 2018-10-22 MED ORDER — METFORMIN HCL ER 500 MG PO TB24
500.0000 mg | ORAL_TABLET | Freq: Two times a day (BID) | ORAL | Status: DC
  Administered 2018-10-22: 500 mg via ORAL
  Filled 2018-10-22 (×2): qty 1

## 2018-10-22 MED ORDER — ATORVASTATIN CALCIUM 20 MG PO TABS: 80.0000 mg | ORAL_TABLET | Freq: Every day | ORAL | Status: DC

## 2018-10-22 MED ORDER — ONDANSETRON HCL 4 MG/2ML IJ SOLN: 4.0000 mg | Freq: Four times a day (QID) | INTRAMUSCULAR | Status: DC | PRN

## 2018-10-22 MED ORDER — ALUM & MAG HYDROXIDE-SIMETH 200-200-20 MG/5ML PO SUSP
30.0000 mL | Freq: Once | ORAL | Status: AC
  Administered 2018-10-22: 30 mL via ORAL
  Filled 2018-10-22: qty 30

## 2018-10-22 MED ORDER — AMLODIPINE BESYLATE 5 MG PO TABS
10.0000 mg | ORAL_TABLET | Freq: Every day | ORAL | Status: DC
  Administered 2018-10-22: 10 mg via ORAL
  Filled 2018-10-22 (×2): qty 2

## 2018-10-22 MED ORDER — FAMOTIDINE 20 MG PO TABS
20.0000 mg | ORAL_TABLET | Freq: Two times a day (BID) | ORAL | Status: DC
  Administered 2018-10-22: 20 mg via ORAL
  Filled 2018-10-22: qty 1

## 2018-10-22 MED ORDER — ACETAMINOPHEN 325 MG PO TABS: 650.0000 mg | ORAL_TABLET | ORAL | Status: DC | PRN

## 2018-10-22 MED ORDER — ASPIRIN 81 MG PO CHEW
81.0000 mg | CHEWABLE_TABLET | Freq: Every day | ORAL | Status: DC
  Administered 2018-10-22: 81 mg via ORAL
  Filled 2018-10-22: qty 1

## 2018-10-22 NOTE — Discharge Summary (Signed)
[facility] [place] NAME: Justin Keith    MR#:  [id number]  DATE OF BIRTH:  10-13-1945  DATE OF ADMISSION:  10/22/2018 ADMITTING PHYSICIAN: No admitting provider for patient encounter.  DATE OF DISCHARGE: 10/22/2018  PRIMARY CARE PHYSICIAN: [facility] [place]   ADMISSION DIAGNOSIS:  Chest Pain  DISCHARGE DIAGNOSIS:  Atypical chest pain Coronary disease Type 2 diabetes mellitus GERD Hyperlipidemia Hypertension  SECONDARY DIAGNOSIS:   Past Medical History:  Diagnosis Date  . CAD (coronary artery disease)   . CKD (chronic kidney disease)   . Diabetes mellitus without complication ([place])   . GERD (gastroesophageal reflux disease)   . Hyperlipemia   . Hypertension      ADMITTING HISTORY Brody Bonneau  is a 73 y.o. male with a known history of coronary artery disease with coronary artery bypass graft x2 in 2005.  Patient is followed by Dr. Clayborn Bigness with cardiology.  He presents emergency room complaining of midsternal chest pain described as heaviness which woke him from sleep around 10 p.m. on 10/21/2018.  He noted associated shortness of breath however no nausea, vomiting, diaphoresis.  He noted left anterior radiation of the pain.  Pain lasted for about 5 minutes with a pain score 8 out of 10.  Patient took 381 mg aspirins and 1 nitroglycerin with resolution of the pain at that time.  However pain returned within 15 to 30 minutes with the same intensity. Initial troponin is less than 0.03.  Potassium is 4.9.  Chest x-ray demonstrated no acute disease.  No acute findings on EKG. Patient was seen 4 days prior with similar complaint evaluated and discharged home at that time.  Given his repeated history of radiating chest pain, we have admitted him to the hospitalist service for further management and observation.   HOSPITAL COURSE:  Patient was evaluated for chest pain.  Serial troponins were all negative.  Patient  had recent stress test done in April 2020 which was normal.  Patient will continue cardiac medications and statin medication and follow-up with cardiology in the clinic.  Chest pain appears more atypical.  Discussed with cardiology who recommended discharge of the patient.  CONSULTS OBTAINED:  Treatment Team:  Isaias Cowman, MD  DRUG ALLERGIES:  No Known Allergies  DISCHARGE MEDICATIONS:   Allergies as of 10/22/2018   No Known Allergies     Medication List    TAKE these medications   amLODipine 10 MG tablet Commonly known as:  NORVASC Take 1 tablet (10 mg total) by mouth daily.   aspirin 81 MG chewable tablet Chew 1 tablet (81 mg total) by mouth daily.   atorvastatin 80 MG tablet Commonly known as:  LIPITOR Take 1 tablet by mouth daily.   lisinopril 10 MG tablet Commonly known as:  ZESTRIL Take 1 tablet (10 mg total) by mouth daily.   metFORMIN 500 MG 24 hr tablet Commonly known as:  GLUCOPHAGE-XR Take 500 mg by mouth 2 (two) times daily.   nitroGLYCERIN 0.4 MG SL tablet Commonly known as:  NITROSTAT Place 0.4 mg under the tongue every 5 (five) minutes as needed for chest pain.   ranitidine 150 MG tablet Commonly known as:  ZANTAC Take 150 mg by mouth 2 (two) times daily.       Today  Patient seen today No chest pain No shortness of breath Hemodynamically stable  VITAL SIGNS:  Blood pressure 125/75, pulse (!) 52, temperature 98.5 F (36.9 C), temperature source Oral,  resp. rate 12, height 5\' 8"  (1.727 m), weight 108.9 kg, SpO2 97 %.  I/O:    Intake/Output Summary (Last 24 hours) at 10/22/2018 1430 Last data filed at 10/22/2018 0943 Gross per 24 hour  Intake 360 ml  Output 875 ml  Net -515 ml    PHYSICAL EXAMINATION:  Physical Exam  GENERAL:  73 y.o.-year-old patient lying in the bed with no acute distress.  LUNGS: Normal breath sounds bilaterally, no wheezing, rales,rhonchi or crepitation. No use of accessory muscles of respiration.   CARDIOVASCULAR: S1, S2 normal. No murmurs, rubs, or gallops.  ABDOMEN: Soft, non-tender, non-distended. Bowel sounds present. No organomegaly or mass.  NEUROLOGIC: Moves all 4 extremities. PSYCHIATRIC: The patient is alert and oriented x 3.  SKIN: No obvious rash, lesion, or ulcer.   DATA REVIEW:   CBC Recent Labs  Lab 10/22/18 0236  WBC 10.3  HGB 11.8*  HCT 35.9*  PLT 317    Chemistries  Recent Labs  Lab 10/22/18 0236  NA 136  K 4.9  CL 104  CO2 24  GLUCOSE 118*  BUN 20  CREATININE 1.46*  CALCIUM 9.5    Cardiac Enzymes Recent Labs  Lab 10/22/18 1004  TROPONINI <0.03    Microbiology Results  Results for orders placed or performed during the hospital encounter of 10/22/18  SARS Coronavirus 2 (CEPHEID - Performed in [place] hospital lab), Hosp Order     Status: None   Collection Time: 10/22/18  3:44 AM  Result Value Ref Range Status   SARS Coronavirus 2 NEGATIVE NEGATIVE Final    Comment: (NOTE) If result is NEGATIVE SARS-CoV-2 target nucleic acids are NOT DETECTED. The SARS-CoV-2 RNA is generally detectable in upper and lower  respiratory specimens during the acute phase of infection. The lowest  concentration of SARS-CoV-2 viral copies this assay can detect is 250  copies / mL. A negative result does not preclude SARS-CoV-2 infection  and should not be used as the sole basis for treatment or other  patient management decisions.  A negative result may occur with  improper specimen collection / handling, submission of specimen other  than nasopharyngeal swab, presence of viral mutation(s) within the  areas targeted by this assay, and inadequate number of viral copies  (<250 copies / mL). A negative result must be combined with clinical  observations, patient history, and epidemiological information. If result is POSITIVE SARS-CoV-2 target nucleic acids are DETECTED. The SARS-CoV-2 RNA is generally detectable in upper and lower  respiratory specimens  dur ing the acute phase of infection.  Positive  results are indicative of active infection with SARS-CoV-2.  Clinical  correlation with patient history and other diagnostic information is  necessary to determine patient infection status.  Positive results do  not rule out bacterial infection or co-infection with other viruses. If result is PRESUMPTIVE POSTIVE SARS-CoV-2 nucleic acids MAY BE PRESENT.   A presumptive positive result was obtained on the submitted specimen  and confirmed on repeat testing.  While 2019 novel coronavirus  (SARS-CoV-2) nucleic acids may be present in the submitted sample  additional confirmatory testing may be necessary for epidemiological  and / or clinical management purposes  to differentiate between  SARS-CoV-2 and other Sarbecovirus currently known to infect humans.  If clinically indicated additional testing with an alternate test  methodology [phone]) is advised. The SARS-CoV-2 RNA is generally  detectable in upper and lower respiratory sp ecimens during the acute  phase of infection. The expected result is Negative. Fact  Sheet for Patients:  StrictlyIdeas.no Fact Sheet for Healthcare Providers: BankingDealers.co.za This test is not yet approved or cleared by the [place] FDA and has been authorized for detection and/or diagnosis of SARS-CoV-2 by FDA under an Emergency Use Authorization (EUA).  This EUA will remain in effect (meaning this test can be used) for the duration of the COVID-19 declaration under Section 564(b)(1) of the Act, 21 U.S.C. section 360bbb-3(b)(1), unless the authorization is terminated or revoked sooner. Performed at [facility], [address] [place], [place] [address]     RADIOLOGY:  Dg Chest Port 1 View  Result Date: 10/22/2018 CLINICAL DATA:  73 year old male with chest pain tonight. EXAM: PORTABLE CHEST 1 VIEW COMPARISON:  Portable chest 10/19/2018 and  earlier. FINDINGS: Portable AP upright view at 0229 hours. Previous sternotomy and CABG. Stable cardiac size and mediastinal contours. Stable lung volumes. Allowing for portable technique the lungs are clear. Visualized tracheal air column is within normal limits. No pneumothorax. Stable visualized osseous structures. IMPRESSION: No acute cardiopulmonary abnormality. Electronically Signed   By: Genevie Ann M.D.   On: 10/22/2018 03:12    Follow up with PCP in 1 week.  Management plans discussed with the patient, family and they are in agreement.  CODE STATUS: Full code    Code Status Orders  (From admission, onward)         Start     Ordered   10/22/18 0424  Full code  Continuous     10/22/18 0423        Code Status History    Date Active Date Inactive Code Status Order ID Comments User Context   09/15/2018 0220 09/15/2018 1744 Full Code [id number]  Lance Coon, MD ED   08/07/2017 1309 08/07/2017 1734 Full Code [id number]  Yolonda Kida, MD Inpatient   12/07/2015 1430 12/08/2015 1510 Full Code [id number]  Epifanio Lesches, MD ED      TOTAL TIME TAKING CARE OF THIS PATIENT ON DAY OF DISCHARGE: more than 38 minutes.   Saundra Shelling M.D on 10/22/2018 at 2:30 PM  Between 7am to 6pm - Pager - [phone]  After 6pm go to [email or web address] - password [email or web address] [place] [facility]  Office  [phone]  CC: Primary care physician; [facility] [place]  Note: This dictation was prepared with Diplomatic Services operational officer dictation along with smaller phrase technology. Any transcriptional errors that result from this process are unintentional.

## 2018-10-22 NOTE — ED Notes (Signed)
ED TO INPATIENT HANDOFF REPORT  ED Nurse Name and Phone #: Benay Pillow [phone]  S Name/Age/Gender Justin Keith 73 y.o. male Room/Bed: ED10A/ED10A  Code Status   Code Status: Full Code  Home/SNF/Other Home Patient oriented to: self, place, time and situation Is this baseline? Yes   Triage Complete: Triage complete  Chief Complaint Chest Pain  Triage Note pt to the er from home. At about 11pm left side cp radiated to left side, hx of double bypass in 2005, took ASA 324 mg, took 1 nitro with relief of pain and then pain returned. 12 lead good. seen here 2 days ago for same. sugar was 115, no nausea or sob.   Allergies No Known Allergies  Level of Care/Admitting Diagnosis ED Disposition    ED Disposition Condition [facility]: [place] [[id number]]  Level of Care: Med-Surg [16]  Covid Evaluation: N/A  Diagnosis: Chest pain in adult [0881103]  Admitting Physician: Christel Mormon [[id number]]  Attending Physician: Christel Mormon [[id number]]  PT Class (Do Not Modify): Observation [104]  PT Acc Code (Do Not Modify): Observation [10022]       B Medical/Surgery History Past Medical History:  Diagnosis Date  . CAD (coronary artery disease)   . CKD (chronic kidney disease)   . Diabetes mellitus without complication ([place])   . GERD (gastroesophageal reflux disease)   . Hyperlipemia   . Hypertension    Past Surgical History:  Procedure Laterality Date  . CARDIAC SURGERY    . COLONOSCOPY WITH PROPOFOL N/A 02/25/2018   Procedure: COLONOSCOPY WITH PROPOFOL;  Surgeon: Jonathon Bellows, MD;  Location: [facility] ENDOSCOPY;  Service: Gastroenterology;  Laterality: N/A;  . COLONOSCOPY WITH PROPOFOL N/A 02/26/2018   Procedure: COLONOSCOPY WITH PROPOFOL;  Surgeon: Jonathon Bellows, MD;  Location: [facility] ENDOSCOPY;  Service: Gastroenterology;  Laterality: N/A;  . FOREIGN BODY REMOVAL N/A 03/07/2018   Procedure: FOREIGN BODY REMOVAL;  Surgeon: Jonathon Bellows, MD;   Location: [facility] ENDOSCOPY;  Service: Gastroenterology;  Laterality: N/A;  . LEFT HEART CATH AND CORS/GRAFTS ANGIOGRAPHY N/A 08/07/2017   Procedure: LEFT HEART CATH AND CORS/GRAFTS ANGIOGRAPHY;  Surgeon: Yolonda Kida, MD;  Location: [place] CV LAB;  Service: Cardiovascular;  Laterality: N/A;  . PROSTATE SURGERY    . SHOULDER ARTHROSCOPY       A IV Location/Drains/Wounds Patient Lines/Drains/Airways Status   Active Line/Drains/Airways    Name:   Placement date:   Placement time:   Site:   Days:   Peripheral IV 10/22/18 Left Antecubital   10/22/18    0234    Antecubital   less than 1          Intake/Output Last 24 hours  Intake/Output Summary (Last 24 hours) at 10/22/2018 1344 Last data filed at 10/22/2018 0943 Gross per 24 hour  Intake 360 ml  Output 875 ml  Net -515 ml    Labs/Imaging Results for orders placed or performed during the hospital encounter of 10/22/18 (from the past 48 hour(s))  Basic metabolic panel     Status: Abnormal   Collection Time: 10/22/18  2:36 AM  Result Value Ref Range   Sodium 136 135 - 145 mmol/L   Potassium 4.9 3.5 - 5.1 mmol/L   Chloride 104 98 - 111 mmol/L   CO2 24 22 - 32 mmol/L   Glucose, Bld 118 (H) 70 - 99 mg/dL   BUN 20 8 - 23 mg/dL   Creatinine, Ser 1.46 (H) 0.61 - 1.24  mg/dL   Calcium 9.5 8.9 - 10.3 mg/dL   GFR calc non Af Amer 47 (L) >60 mL/min   GFR calc Af Amer 55 (L) >60 mL/min   Anion gap 8 5 - 15    Comment: Performed at [facility], [address] [place], [place] [address]  CBC     Status: Abnormal   Collection Time: 10/22/18  2:36 AM  Result Value Ref Range   WBC 10.3 4.0 - 10.5 K/uL   RBC 3.98 (L) 4.22 - 5.81 MIL/uL   Hemoglobin 11.8 (L) 13.0 - 17.0 g/dL   HCT 35.9 (L) 39.0 - 52.0 %   MCV 90.2 80.0 - 100.0 fL   MCH 29.6 26.0 - 34.0 pg   MCHC 32.9 30.0 - 36.0 g/dL   RDW 14.2 11.5 - 15.5 %   Platelets 317 150 - 400 K/uL   nRBC 0.0 0.0 - 0.2 %    Comment: Performed at [facility], [address] [place], [place] [address]  Troponin I - ONCE - STAT     Status: None   Collection Time: 10/22/18  2:36 AM  Result Value Ref Range   Troponin I <0.03 <0.03 ng/mL    Comment: Performed at [facility], [address] [place], [place] [address]  SARS Coronavirus 2 (CEPHEID - Performed in [place] hospital lab), Hosp Order     Status: None   Collection Time: 10/22/18  3:44 AM  Result Value Ref Range   SARS Coronavirus 2 NEGATIVE NEGATIVE    Comment: (NOTE) If result is NEGATIVE SARS-CoV-2 target nucleic acids are NOT DETECTED. The SARS-CoV-2 RNA is generally detectable in upper and lower  respiratory specimens during the acute phase of infection. The lowest  concentration of SARS-CoV-2 viral copies this assay can detect is 250  copies / mL. A negative result does not preclude SARS-CoV-2 infection  and should not be used as the sole basis for treatment or other  patient management decisions.  A negative result may occur with  improper specimen collection / handling, submission of specimen other  than nasopharyngeal swab, presence of viral mutation(s) within the  areas targeted by this assay, and inadequate number of viral copies  (<250 copies / mL). A negative result must be combined with clinical  observations, patient history, and epidemiological information. If result is POSITIVE SARS-CoV-2 target nucleic acids are DETECTED. The SARS-CoV-2 RNA is generally detectable in upper and lower  respiratory specimens dur ing the acute phase of infection.  Positive  results are indicative of active infection with SARS-CoV-2.  Clinical  correlation with patient history and other diagnostic information is  necessary to determine patient infection status.  Positive results do  not rule out bacterial infection or co-infection with other viruses. If result is PRESUMPTIVE POSTIVE SARS-CoV-2 nucleic acids MAY BE PRESENT.   A presumptive positive result was obtained on the  submitted specimen  and confirmed on repeat testing.  While 2019 novel coronavirus  (SARS-CoV-2) nucleic acids may be present in the submitted sample  additional confirmatory testing may be necessary for epidemiological  and / or clinical management purposes  to differentiate between  SARS-CoV-2 and other Sarbecovirus currently known to infect humans.  If clinically indicated additional testing with an alternate test  methodology [phone]) is advised. The SARS-CoV-2 RNA is generally  detectable in upper and lower respiratory sp ecimens during the acute  phase of infection. The expected result is Negative. Fact Sheet for Patients:  StrictlyIdeas.no  Fact Sheet for Healthcare Providers: BankingDealers.co.za This test is not yet approved or cleared by the [place] FDA and has been authorized for detection and/or diagnosis of SARS-CoV-2 by FDA under an Emergency Use Authorization (EUA).  This EUA will remain in effect (meaning this test can be used) for the duration of the COVID-19 declaration under Section 564(b)(1) of the Act, 21 U.S.C. section 360bbb-3(b)(1), unless the authorization is terminated or revoked sooner. Performed at [facility], [address] [place], [place] [address]   Troponin I - Now Then [facility]     Status: None   Collection Time: 10/22/18  7:04 AM  Result Value Ref Range   Troponin I <0.03 <0.03 ng/mL    Comment: Performed at [facility], [place], [place] [address]  Troponin I - Now Then [facility]     Status: None   Collection Time: 10/22/18 10:04 AM  Result Value Ref Range   Troponin I <0.03 <0.03 ng/mL    Comment: Performed at [facility], [address] [place], [place] [address]   Dg Chest [place] 1 View  Result Date: 10/22/2018 CLINICAL DATA:  73 year old male with chest pain tonight. EXAM: PORTABLE CHEST 1 VIEW COMPARISON:  Portable chest 10/19/2018 and earlier.  FINDINGS: Portable AP upright view at 0229 hours. Previous sternotomy and CABG. Stable cardiac size and mediastinal contours. Stable lung volumes. Allowing for portable technique the lungs are clear. Visualized tracheal air column is within normal limits. No pneumothorax. Stable visualized osseous structures. IMPRESSION: No acute cardiopulmonary abnormality. Electronically Signed   By: Genevie Ann M.D.   On: 10/22/2018 03:12    Pending Labs Unresulted Labs (From admission, onward)    Start     Ordered   10/29/18 0500  Creatinine, serum  (enoxaparin (LOVENOX)    CrCl >/= 30 ml/min)  Weekly,   STAT    Comments:  while on enoxaparin therapy    10/22/18 0423   10/22/18 0424  Troponin I - Now Then Q3H  Now then every 3 hours,   STAT     10/22/18 0423          Vitals/Pain Today's Vitals   10/22/18 1200 10/22/18 1230 10/22/18 1300 10/22/18 1330  BP: 121/74 120/73 117/67 125/67  Pulse: (!) 55  (!) 53 (!) 57  Resp: 19 18 18 14   Temp:      TempSrc:      SpO2: 96%  95% 99%  Weight:      Height:      PainSc:        Isolation Precautions No active isolations  Medications Medications  amLODipine (NORVASC) tablet 10 mg (10 mg Oral Given 10/22/18 0907)  aspirin chewable tablet 81 mg (81 mg Oral Given 10/22/18 0907)  lisinopril (ZESTRIL) tablet 10 mg (10 mg Oral Given 10/22/18 0907)  atorvastatin (LIPITOR) tablet 80 mg (has no administration in time range)  metFORMIN (GLUCOPHAGE-XR) 24 hr tablet 500 mg (500 mg Oral Given 10/22/18 0933)  famotidine (PEPCID) tablet 20 mg (20 mg Oral Given 10/22/18 0907)  acetaminophen (TYLENOL) tablet 650 mg (has no administration in time range)  ondansetron (ZOFRAN) injection 4 mg (has no administration in time range)  enoxaparin (LOVENOX) injection 40 mg (40 mg Subcutaneous Given 10/22/18 0557)  morphine 2 MG/ML injection 2 mg (has no administration in time range)  nitroGLYCERIN (NITROSTAT) SL tablet 0.4 mg (has no administration in time range)  fentaNYL  (SUBLIMAZE) injection 50 mcg (50 mcg Intravenous Given 10/22/18 0309)  alum & mag hydroxide-simeth (MAALOX/MYLANTA) 200-200-20 MG/5ML suspension 30 mL (30 mLs Oral Given 10/22/18 0555)    And  lidocaine (XYLOCAINE) 2 % viscous mouth solution 15 mL (15 mLs Oral Given 10/22/18 0555)    Mobility walks with person assist Low fall risk   Focused Assessments Cardiac Assessment Handoff:  Cardiac Rhythm: Sinus bradycardia Lab Results  Component Value Date   CKTOTAL 226 07/25/2017   CKMB 4.8 (H) 09/19/2011   TROPONINI <0.03 10/22/2018   No results found for: DDIMER Does the Patient currently have chest pain? No     R Recommendations: See Admitting Provider Note  Report given to:   Additional Notes:

## 2018-10-22 NOTE — ED Notes (Signed)
ED TO INPATIENT HANDOFF REPORT  ED Nurse Name and Phone #:  Wells Guiles # [phone]  S Name/Age/Gender Justin Keith 73 y.o. male Room/Bed: ED10A/ED10A  Code Status   Code Status: Prior  Home/SNF/Other Home Patient oriented to: self, place, time and situation Is this baseline? Yes   Triage Complete: Triage complete  Chief Complaint Chest Pain  Triage Note pt to the er from home. At about 11pm left side cp radiated to left side, hx of double bypass in 2005, took ASA 324 mg, took 1 nitro with relief of pain and then pain returned. 12 lead good. seen here 2 days ago for same. sugar was 115, no nausea or sob.   Allergies No Known Allergies  Level of Care/Admitting Diagnosis ED Disposition    ED Disposition Condition Comment   Admit  The patient appears reasonably stabilized for admission considering the current resources, flow, and capabilities available in the ED at this time, and I doubt any other [facility] requiring further screening and/or treatment in the ED prior to admission is  present.       B Medical/Surgery History Past Medical History:  Diagnosis Date  . CAD (coronary artery disease)   . CKD (chronic kidney disease)   . Diabetes mellitus without complication ([place])   . GERD (gastroesophageal reflux disease)   . Hyperlipemia   . Hypertension    Past Surgical History:  Procedure Laterality Date  . CARDIAC SURGERY    . COLONOSCOPY WITH PROPOFOL N/A 02/25/2018   Procedure: COLONOSCOPY WITH PROPOFOL;  Surgeon: Jonathon Bellows, MD;  Location: [facility] ENDOSCOPY;  Service: Gastroenterology;  Laterality: N/A;  . COLONOSCOPY WITH PROPOFOL N/A 02/26/2018   Procedure: COLONOSCOPY WITH PROPOFOL;  Surgeon: Jonathon Bellows, MD;  Location: [facility] ENDOSCOPY;  Service: Gastroenterology;  Laterality: N/A;  . FOREIGN BODY REMOVAL N/A 03/07/2018   Procedure: FOREIGN BODY REMOVAL;  Surgeon: Jonathon Bellows, MD;  Location: [facility] ENDOSCOPY;  Service: Gastroenterology;  Laterality: N/A;  . LEFT HEART CATH  AND CORS/GRAFTS ANGIOGRAPHY N/A 08/07/2017   Procedure: LEFT HEART CATH AND CORS/GRAFTS ANGIOGRAPHY;  Surgeon: Yolonda Kida, MD;  Location: [place] CV LAB;  Service: Cardiovascular;  Laterality: N/A;  . PROSTATE SURGERY    . SHOULDER ARTHROSCOPY       A IV Location/Drains/Wounds Patient Lines/Drains/Airways Status   Active Line/Drains/Airways    Name:   Placement date:   Placement time:   Site:   Days:   Peripheral IV 10/22/18 Left Antecubital   10/22/18    0234    Antecubital   less than 1          Intake/Output Last 24 hours No intake or output data in the 24 hours ending 10/22/18 0417  Labs/Imaging Results for orders placed or performed during the hospital encounter of 10/22/18 (from the past 48 hour(s))  Basic metabolic panel     Status: Abnormal   Collection Time: 10/22/18  2:36 AM  Result Value Ref Range   Sodium 136 135 - 145 mmol/L   Potassium 4.9 3.5 - 5.1 mmol/L   Chloride 104 98 - 111 mmol/L   CO2 24 22 - 32 mmol/L   Glucose, Bld 118 (H) 70 - 99 mg/dL   BUN 20 8 - 23 mg/dL   Creatinine, Ser 1.46 (H) 0.61 - 1.24 mg/dL   Calcium 9.5 8.9 - 10.3 mg/dL   GFR calc non Af Amer 47 (L) >60 mL/min   GFR calc Af Amer 55 (L) >60 mL/min   Anion gap 8  5 - 15    Comment: Performed at [facility], [place] [place], [place] [address]  CBC     Status: Abnormal   Collection Time: 10/22/18  2:36 AM  Result Value Ref Range   WBC 10.3 4.0 - 10.5 K/uL   RBC 3.98 (L) 4.22 - 5.81 MIL/uL   Hemoglobin 11.8 (L) 13.0 - 17.0 g/dL   HCT 35.9 (L) 39.0 - 52.0 %   MCV 90.2 80.0 - 100.0 fL   MCH 29.6 26.0 - 34.0 pg   MCHC 32.9 30.0 - 36.0 g/dL   RDW 14.2 11.5 - 15.5 %   Platelets 317 150 - 400 K/uL   nRBC 0.0 0.0 - 0.2 %    Comment: Performed at [facility], [address] [place], [place] [address]  Troponin I - ONCE - STAT     Status: None   Collection Time: 10/22/18  2:36 AM  Result Value Ref Range   Troponin I <0.03 <0.03 ng/mL    Comment:  Performed at [facility], [address], [place], [place] [address]   Dg Chest Port 1 View  Result Date: 10/22/2018 CLINICAL DATA:  73 year old male with chest pain tonight. EXAM: PORTABLE CHEST 1 VIEW COMPARISON:  Portable chest 10/19/2018 and earlier. FINDINGS: Portable AP upright view at 0229 hours. Previous sternotomy and CABG. Stable cardiac size and mediastinal contours. Stable lung volumes. Allowing for portable technique the lungs are clear. Visualized tracheal air column is within normal limits. No pneumothorax. Stable visualized osseous structures. IMPRESSION: No acute cardiopulmonary abnormality. Electronically Signed   By: Genevie Ann M.D.   On: 10/22/2018 03:12    Pending Labs Unresulted Labs (From admission, onward)    Start     Ordered   10/22/18 0237  SARS Coronavirus 2 (CEPHEID - Performed in [place] hospital lab), Hosp Order  (Asymptomatic Patients Labs)  Once,   STAT    Question:  Rule Out  Answer:  Yes   10/22/18 0236          Vitals/Pain Today's Vitals   10/22/18 0309 10/22/18 0325 10/22/18 0400 10/22/18 0417  BP:   124/68   Pulse:   (!) 58   Resp:   16   Temp:      TempSrc:      SpO2:   96%   Weight:      Height:      PainSc: 8  6   5      Isolation Precautions No active isolations  Medications Medications  fentaNYL (SUBLIMAZE) injection 50 mcg (50 mcg Intravenous Given 10/22/18 0309)    Mobility walks Low fall risk   Focused Assessments Cardiac Assessment Handoff:    Lab Results  Component Value Date   CKTOTAL 226 07/25/2017   CKMB 4.8 (H) 09/19/2011   TROPONINI <0.03 10/22/2018   No results found for: DDIMER Does the Patient currently have chest pain? Yes     R Recommendations: See Admitting Provider Note  Report given to:   Additional Notes:

## 2018-10-22 NOTE — TOC Initial Note (Signed)
Transition of Care [facility]) - Initial/Assessment Note    Patient Details  Name: KELDAN EPLIN MRN: [id number] Date of Birth: 1945-07-12  Transition [facility] Care [facility]) CM/SW Contact:    Katrina Stack, RN Phone Number: 10/22/2018, 8:47 AM  Clinical Narrative:                  Placed in observation for chest pain. Negative troponins. Cardiac history. Followed by Marcelline Deist. Current with PCP at [facility]. Denies issues obtaining meds. No DME or services at home. Denies issues obtaining meds. Does not drive. Uses [employer]. Brother will take him home at discharge  Expected Discharge Plan: Home/Self Care Barriers to Discharge: No Barriers Identified   Patient Goals and CMS Choice Patient states their goals for this hospitalization and ongoing recovery are:: Go home CMS Medicare.gov Compare Post Acute Care list provided to:: Other (Comment Required)(NA) Choice offered to / list presented to : NA  Expected Discharge Plan and Services Expected Discharge Plan: Home/Self Care In-house Referral: NA Discharge Planning Services: Other - See comment(None identified) Post Acute Care Choice: NA Living arrangements for the past 2 months: Single Family Home                 DME Arranged: N/A DME Agency: NA       HH Arranged: NA          Prior Living Arrangements/Services Living arrangements for the past 2 months: Single Family Home Lives with:: Self Patient language and need for interpreter reviewed:: No Do you feel safe going back to the place where you live?: Yes      Need for Family Participation in Patient Care: No (Comment) Care giver support system in place?: Yes (comment) Current home services: (NONE) Criminal Activity/Legal Involvement Pertinent to Current Situation/Hospitalization: No - Comment as needed  Activities of Daily Living      Permission Sought/Granted Permission sought to share information with : Case Manager Permission granted to share information with : Yes, Verbal  Permission Granted              Emotional Assessment Appearance:: Appears stated age Attitude/Demeanor/Rapport: Engaged Affect (typically observed): Accepting Orientation: : Oriented to Self, Oriented to  Time, Oriented to Place, Oriented to Situation Alcohol / Substance Use: Not Applicable Psych Involvement: No (comment)  Admission diagnosis:  Chest Pain Patient Active Problem List   Diagnosis Date Noted  . Chest pain in adult 10/22/2018  . HTN (hypertension) 09/15/2018  . HLD (hyperlipidemia) 09/15/2018  . GERD (gastroesophageal reflux disease) 09/15/2018  . CAD (coronary artery disease) 09/15/2018  . Diabetes ([place]) 09/15/2018  . Chest pain 12/07/2015   PCP:  [facility], [place]:   [place], [place] [address] Phone: [phone] Fax: [phone]     Social Determinants of Health (SDOH) Interventions    Readmission Risk Interventions No flowsheet data found.

## 2018-10-22 NOTE — ED Notes (Signed)
Pt ate all of breakfast. TV turned on for pt.  Lights dimmed.

## 2018-10-22 NOTE — Progress Notes (Signed)
[place] [facility] [place] NAME: Justin Keith    MR#:  [id number]  DATE OF BIRTH:  September 23, 1945  SUBJECTIVE:  CHIEF COMPLAINT:   Chief Complaint  Patient presents with  . Chest Pain  Patient seen today in the emergency room Chest pain appears better No shortness of breath No fever  REVIEW OF SYSTEMS:    ROS  CONSTITUTIONAL: No documented fever. No fatigue, weakness. No weight gain, no weight loss.  EYES: No blurry or double vision.  ENT: No tinnitus. No postnasal drip. No redness of the oropharynx.  RESPIRATORY: No cough, no wheeze, no hemoptysis. No dyspnea.  CARDIOVASCULAR: Has no new episodes of chest pain. No orthopnea. No palpitations. No syncope.  GASTROINTESTINAL: No nausea, no vomiting or diarrhea. No abdominal pain. No melena or hematochezia.  GENITOURINARY: No dysuria or hematuria.  ENDOCRINE: No polyuria or nocturia. No heat or cold intolerance.  HEMATOLOGY: No anemia. No bruising. No bleeding.  INTEGUMENTARY: No rashes. No lesions.  MUSCULOSKELETAL: No arthritis. No swelling. No gout.  NEUROLOGIC: No numbness, tingling, or ataxia. No seizure-type activity.  PSYCHIATRIC: No anxiety. No insomnia. No ADD.   DRUG ALLERGIES:  No Known Allergies  VITALS:  Blood pressure 125/67, pulse (!) 57, temperature 98.5 F (36.9 C), temperature source Oral, resp. rate 14, height 5\' 8"  (1.727 m), weight 108.9 kg, SpO2 99 %.  PHYSICAL EXAMINATION:   Physical Exam  GENERAL:  73 y.o.-year-old patient lying in the bed with no acute distress.  EYES: Pupils equal, round, reactive to light and accommodation. No scleral icterus. Extraocular muscles intact.  HEENT: Head atraumatic, normocephalic. Oropharynx and nasopharynx clear.  NECK:  Supple, no jugular venous distention. No thyroid enlargement, no tenderness.  LUNGS: Normal breath sounds bilaterally, no wheezing, rales, rhonchi. No use of accessory muscles of respiration.  CARDIOVASCULAR: S1,  S2 normal. No murmurs, rubs, or gallops.  ABDOMEN: Soft, nontender, nondistended. Bowel sounds present. No organomegaly or mass.  EXTREMITIES: No cyanosis, clubbing or edema b/l.    NEUROLOGIC: Cranial nerves II through XII are intact. No focal Motor or sensory deficits b/l.   PSYCHIATRIC: The patient is alert and oriented x 3.  SKIN: No obvious rash, lesion, or ulcer.   LABORATORY PANEL:   CBC Recent Labs  Lab 10/22/18 0236  WBC 10.3  HGB 11.8*  HCT 35.9*  PLT 317   ------------------------------------------------------------------------------------------------------------------ Chemistries  Recent Labs  Lab 10/22/18 0236  NA 136  K 4.9  CL 104  CO2 24  GLUCOSE 118*  BUN 20  CREATININE 1.46*  CALCIUM 9.5   ------------------------------------------------------------------------------------------------------------------  Cardiac Enzymes Recent Labs  Lab 10/22/18 1004  TROPONINI <0.03   ------------------------------------------------------------------------------------------------------------------  RADIOLOGY:  Dg Chest Port 1 View  Result Date: 10/22/2018 CLINICAL DATA:  73 year old male with chest pain tonight. EXAM: PORTABLE CHEST 1 VIEW COMPARISON:  Portable chest 10/19/2018 and earlier. FINDINGS: Portable AP upright view at 0229 hours. Previous sternotomy and CABG. Stable cardiac size and mediastinal contours. Stable lung volumes. Allowing for portable technique the lungs are clear. Visualized tracheal air column is within normal limits. No pneumothorax. Stable visualized osseous structures. IMPRESSION: No acute cardiopulmonary abnormality. Electronically Signed   By: Genevie Ann M.D.   On: 10/22/2018 03:12     ASSESSMENT AND PLAN:  73 year old elderly male patient with history of coronary disease, CABG in 2005, type 2 diabetes mellitus, CKD, GERD, hyperlipidemia currently under hospitalist service for chest pain.  -Chest pain Troponin negative Cardiology  evaluation pending Continue aspirin  statin  -Hypertension Continue Norvasc and ACE inhibitor  -Type 2 diabetes mellitus Continue oral metformin and sliding scale coverage with insulin  -Hyperlipidemia Continue statin medication  -Chronic kidney disease stage II Monitor renal function  All the records are reviewed and case discussed with Care Management/Social Worker. Management plans discussed with the patient, family and they are in agreement.  CODE STATUS: Full code  DVT Prophylaxis: SCDs  TOTAL TIME TAKING CARE OF THIS PATIENT: 45 minutes.   POSSIBLE D/C IN 1 to 2 DAYS, DEPENDING ON CLINICAL CONDITION.  Saundra Shelling M.D on 10/22/2018 at 2:00 PM  Between 7am to 6pm - Pager - [phone]  After 6pm go to [email or web address] - password [email or web address] [facility] [facility]  Office  [phone]  CC: Primary care physician; [facility], [place]  Note: This dictation was prepared with Diplomatic Services operational officer dictation along with smaller phrase technology. Any transcriptional errors that result from this process are unintentional.

## 2018-10-22 NOTE — ED Provider Notes (Signed)
[facility] Emergency Department Provider Note  ____________________________________________   I have reviewed the triage vital signs and the nursing notes. Where available I have reviewed prior notes and, if possible and indicated, outside hospital notes.    HISTORY  Chief Complaint Chest Pain    HPI Justin Keith is a 73 y.o. male patient seen and evaluated during the coronavirus epidemic during a time with low staffing who presents today complaining of Left-sided chest pain.  Happened while he was lying in bed.  Was not asleep.  No radiation.  Headache.  Similar to what he had a few days ago when he was here.  He has seen his cardiologist in the interim.  Does have a history of CABG, he denies any shortness of breath or pleuritic pain no personal family history of PE or DVT.  No fever no chills.  No cough.  States that the pain was in his left chest when he noticed it gradually come on.  Did not radiate to the back.  He states that he took aspirin and tried nitroglycerin which made the pain go away but then he came back so he called 911.  At this time it is minimal, 4 out of 10.  No other alleviating or aggravating factors no other prior treatment, EMS report reassuring vital signs and reassuring EKG.   Past Medical History:  Diagnosis Date  . CAD (coronary artery disease)   . CKD (chronic kidney disease)   . Diabetes mellitus without complication ([place])   . GERD (gastroesophageal reflux disease)   . Hyperlipemia   . Hypertension     Patient Active Problem List   Diagnosis Date Noted  . HTN (hypertension) 09/15/2018  . HLD (hyperlipidemia) 09/15/2018  . GERD (gastroesophageal reflux disease) 09/15/2018  . CAD (coronary artery disease) 09/15/2018  . Diabetes ([place]) 09/15/2018  . Chest pain 12/07/2015    Past Surgical History:  Procedure Laterality Date  . CARDIAC SURGERY    . COLONOSCOPY WITH PROPOFOL N/A 02/25/2018   Procedure: COLONOSCOPY WITH  PROPOFOL;  Surgeon: Jonathon Bellows, MD;  Location: [facility] ENDOSCOPY;  Service: Gastroenterology;  Laterality: N/A;  . COLONOSCOPY WITH PROPOFOL N/A 02/26/2018   Procedure: COLONOSCOPY WITH PROPOFOL;  Surgeon: Jonathon Bellows, MD;  Location: [facility] ENDOSCOPY;  Service: Gastroenterology;  Laterality: N/A;  . FOREIGN BODY REMOVAL N/A 03/07/2018   Procedure: FOREIGN BODY REMOVAL;  Surgeon: Jonathon Bellows, MD;  Location: [facility] ENDOSCOPY;  Service: Gastroenterology;  Laterality: N/A;  . LEFT HEART CATH AND CORS/GRAFTS ANGIOGRAPHY N/A 08/07/2017   Procedure: LEFT HEART CATH AND CORS/GRAFTS ANGIOGRAPHY;  Surgeon: Yolonda Kida, MD;  Location: [facility] LAB;  Service: Cardiovascular;  Laterality: N/A;  . PROSTATE SURGERY    . SHOULDER ARTHROSCOPY      Prior to Admission medications   Medication Sig Start Date End Date Taking? Authorizing Provider  amLODipine (NORVASC) 10 MG tablet Take 1 tablet (10 mg total) by mouth daily. 12/08/15   Vaughan Basta, MD  aspirin 81 MG chewable tablet Chew 1 tablet (81 mg total) by mouth daily. 12/08/15   Vaughan Basta, MD  atorvastatin (LIPITOR) 80 MG tablet Take 1 tablet by mouth daily.    [provider]  lisinopril (PRINIVIL,ZESTRIL) 10 MG tablet Take 1 tablet (10 mg total) by mouth daily. 12/08/15   Vaughan Basta, MD  metFORMIN (GLUCOPHAGE-XR) 500 MG 24 hr tablet Take 500 mg by mouth 2 (two) times daily.     [provider]  nitroGLYCERIN (NITROSTAT) 0.4  MG SL tablet Place 0.4 mg under the tongue every 5 (five) minutes as needed for chest pain.    [provider]  ranitidine (ZANTAC) 150 MG tablet Take 150 mg by mouth 2 (two) times daily.     [provider]    Allergies Patient has no known allergies.  No family history on file.  Social History Social History   Tobacco Use  . Smoking status: Never Smoker  . Smokeless tobacco: Current User  Substance Use Topics  . Alcohol use: No  . Drug use: Not  Currently    Review of Systems Constitutional: No fever/chills Eyes: No visual changes. ENT: No sore throat. No stiff neck no neck pain Cardiovascular: Denies chest pain. Respiratory: Denies shortness of breath. Gastrointestinal:   no vomiting.  No diarrhea.  No constipation. Genitourinary: Negative for dysuria. Musculoskeletal: Negative lower extremity swelling Skin: Negative for rash. Neurological: Negative for severe headaches, focal weakness or numbness.   ____________________________________________   PHYSICAL EXAM:  VITAL SIGNS: ED Triage Vitals  Enc Vitals Group     BP 10/22/18 0232 136/69     Pulse Rate 10/22/18 0232 65     Resp 10/22/18 0232 12     Temp 10/22/18 0232 98.5 F (36.9 C)     Temp Source 10/22/18 0232 Oral     SpO2 10/22/18 0232 100 %     Weight 10/22/18 0230 240 lb (108.9 kg)     Height 10/22/18 0230 5\' 8"  (1.727 m)     Head Circumference --      Peak Flow --      Pain Score 10/22/18 0230 6     Pain Loc --      Pain Edu? --      Excl. in [place]? --     Constitutional: Alert and oriented. Well appearing and in no acute distress. Eyes: Conjunctivae are normal Head: Atraumatic HEENT: No congestion/rhinnorhea. Mucous membranes are moist.  Oropharynx non-erythematous Neck:   Nontender with no meningismus, no masses, no stridor  Cardiovascular: Normal rate, regular rhythm. Grossly normal heart sounds known murmur appreciated.  Good peripheral circulation. Respiratory: Normal respiratory effort.  No retractions. Lungs CTAB. Abdominal: Soft and nontender. No distention. No guarding no rebound Back:  There is no focal tenderness or step off.  there is no midline tenderness there are no lesions noted. there is no CVA tenderness  Musculoskeletal: No lower extremity tenderness, no upper extremity tenderness. No joint effusions, no DVT signs strong distal pulses no edema Neurologic:  Normal speech and language. No gross focal neurologic deficits are  appreciated.  Skin:  Skin is warm, dry and intact. No rash noted. Psychiatric: Mood and affect are normal. Speech and behavior are normal.  ____________________________________________   LABS (all labs ordered are listed, but only abnormal results are displayed)  Labs Reviewed  CBC - Abnormal; Notable for the following components:      Result Value   RBC 3.98 (*)    Hemoglobin 11.8 (*)    HCT 35.9 (*)    All other components within normal limits  SARS CORONAVIRUS 2 (HOSPITAL ORDER, [facility])  BASIC METABOLIC PANEL  TROPONIN I    Pertinent labs  results that were available during my care of the patient were reviewed by me and considered in my medical decision making (see chart for details). ____________________________________________  EKG  I personally interpreted any EKGs ordered by me or triage Sinus rhythm, PR interval is prolonged.  Normal  axis, rate 61 bpm, no acute ST elevation or depression, nonspecific ST changes noted.  No acute anemia ____________________________________________  RADIOLOGY  Pertinent labs & imaging results that were available during my care of the patient were reviewed by me and considered in my medical decision making (see chart for details). If possible, patient and/or family made aware of any abnormal findings.  No results found. ____________________________________________    PROCEDURES  Procedure(s) performed: None  Procedures  Critical Care performed: None  ____________________________________________   INITIAL IMPRESSION / ASSESSMENT AND PLAN / ED COURSE  Pertinent labs & imaging results that were available during my care of the patient were reviewed by me and considered in my medical decision making (see chart for details).  Patient here with somewhat atypical and somewhat reproducible chest pain with a very strong history of coronary artery disease, this is a second visit to the emergency room a  brief period of time.  We will give him fentanyl to see if it helps his discomfort.  Low suspicion for PE or dissection, patient feels that this is similar to his prior ACS.  He has no acute distress, there are some atypical components to this however given frequent visits by EMS to ED and his history, we likely will need to admit him.  EKG does not show any need to activate the Cath Lab at this time and he has no significant discomfort.    ____________________________________________   FINAL CLINICAL IMPRESSION(S) / ED DIAGNOSES  Final diagnoses:  Chest pain      This chart was dictated using voice recognition software.  Despite best efforts to proofread,  errors can occur which can change meaning.     Schuyler Amor, MD 10/22/18 0300

## 2018-10-22 NOTE — ED Notes (Signed)
Admitting MD called stating that pt will be discharged from ED, do not transfer to the floor.

## 2018-10-22 NOTE — ED Triage Notes (Signed)
pt to the er from home. At about 11pm left side cp radiated to left side, hx of double bypass in 2005, took ASA 324 mg, took 1 nitro with relief of pain and then pain returned. 12 lead good. seen here 2 days ago for same. sugar was 115, no nausea or sob.

## 2018-10-22 NOTE — ED Notes (Signed)
Unlabored. Alert and oriented at this time. Denies chest pain.

## 2018-10-22 NOTE — Care Management Obs Status (Signed)
[place] NOTIFICATION   Patient Details  Name: Justin Keith MRN: [id number] Date of Birth: Nov 07, 1945   Medicare Observation Status Notification Given:  Yes    Katrina Stack, RN 10/22/2018, 8:49 AM

## 2018-10-22 NOTE — H&P (Addendum)
Justin Keith    MR#:  [id number]  DATE OF BIRTH:  [birth year]  DATE OF ADMISSION:  10/22/2018  PRIMARY CARE PHYSICIAN: [facility] [place]   REQUESTING/REFERRING PHYSICIAN: Charlotte Crumb CHIEF COMPLAINT:   Chief Complaint  Patient presents with  . Chest Pain    HISTORY OF PRESENT ILLNESS:  Justin Keith  is a 73 y.o. male with a known history of coronary artery disease with coronary artery bypass graft x2 in 2005.  Patient is followed by Dr. Clayborn Bigness with cardiology.  He presents emergency room complaining of midsternal chest pain described as heaviness which woke him from sleep around 10 p.m. on 10/21/2018.  He noted associated shortness of breath however no nausea, vomiting, diaphoresis.  He noted left anterior radiation of the pain.  Pain lasted for about 5 minutes with a pain score 8 out of 10.  Patient took 381 mg aspirins and 1 nitroglycerin with resolution of the pain at that time.  However pain returned within 15 to 30 minutes with the same intensity.  Initial troponin is less than 0.03.  Potassium is 4.9.  Chest x-ray demonstrated no acute disease.  No acute findings on EKG.  Patient was seen 4 days prior with similar complaint evaluated and discharged home at that time.  Given his repeated history of radiating chest pain, we have admitted him to the hospitalist service for further management and observation.    PAST MEDICAL HISTORY:   Past Medical History:  Diagnosis Date  . CAD (coronary artery disease)   . CKD (chronic kidney disease)   . Diabetes mellitus without complication ([place])   . GERD (gastroesophageal reflux disease)   . Hyperlipemia   . Hypertension     PAST SURGICAL HISTORY:   Past Surgical History:  Procedure Laterality Date  . CARDIAC SURGERY    . COLONOSCOPY WITH PROPOFOL N/A 02/25/2018   Procedure: COLONOSCOPY WITH PROPOFOL;  Surgeon: Jonathon Bellows, MD;   Location: [facility] ENDOSCOPY;  Service: Gastroenterology;  Laterality: N/A;  . COLONOSCOPY WITH PROPOFOL N/A 02/26/2018   Procedure: COLONOSCOPY WITH PROPOFOL;  Surgeon: Jonathon Bellows, MD;  Location: [facility] ENDOSCOPY;  Service: Gastroenterology;  Laterality: N/A;  . FOREIGN BODY REMOVAL N/A 03/07/2018   Procedure: FOREIGN BODY REMOVAL;  Surgeon: Jonathon Bellows, MD;  Location: [facility] ENDOSCOPY;  Service: Gastroenterology;  Laterality: N/A;  . LEFT HEART CATH AND CORS/GRAFTS ANGIOGRAPHY N/A 08/07/2017   Procedure: LEFT HEART CATH AND CORS/GRAFTS ANGIOGRAPHY;  Surgeon: Yolonda Kida, MD;  Location: [place] CV LAB;  Service: Cardiovascular;  Laterality: N/A;  . PROSTATE SURGERY    . SHOULDER ARTHROSCOPY      SOCIAL HISTORY:   Social History   Tobacco Use  . Smoking status: Never Smoker  . Smokeless tobacco: Current User  Substance Use Topics  . Alcohol use: No    FAMILY HISTORY:  No family history on file.  DRUG ALLERGIES:  No Known Allergies  REVIEW OF SYSTEMS:   Review of Systems  Constitutional: Negative for chills, fever and malaise/fatigue.  HENT: Negative for congestion and sinus pain.   Eyes: Negative for blurred vision and double vision.  Respiratory: Positive for shortness of breath. Negative for cough, sputum production and wheezing.   Cardiovascular: Positive for chest pain (Midsternal heaviness). Negative for palpitations, orthopnea, leg swelling and PND.  Gastrointestinal: Negative for abdominal pain, blood in stool, constipation, diarrhea, heartburn, melena, nausea and vomiting.  Genitourinary: Negative for dysuria,  flank pain and hematuria.  Musculoskeletal: Negative for falls, joint pain and myalgias.  Skin: Negative for itching and rash.  Neurological: Negative for dizziness, focal weakness, loss of consciousness, weakness and headaches.  Psychiatric/Behavioral: Negative.       MEDICATIONS AT HOME:   Prior to Admission medications   Medication Sig Start Date  End Date Taking? Authorizing Provider  amLODipine (NORVASC) 10 MG tablet Take 1 tablet (10 mg total) by mouth daily. 12/08/15  Yes Vaughan Basta, MD  aspirin 81 MG chewable tablet Chew 1 tablet (81 mg total) by mouth daily. 12/08/15  Yes Vaughan Basta, MD  atorvastatin (LIPITOR) 80 MG tablet Take 1 tablet by mouth daily.   Yes [provider]  lisinopril (PRINIVIL,ZESTRIL) 10 MG tablet Take 1 tablet (10 mg total) by mouth daily. 12/08/15  Yes Vaughan Basta, MD  metFORMIN (GLUCOPHAGE-XR) 500 MG 24 hr tablet Take 500 mg by mouth 2 (two) times daily.    Yes [provider]  nitroGLYCERIN (NITROSTAT) 0.4 MG SL tablet Place 0.4 mg under the tongue every 5 (five) minutes as needed for chest pain.   Yes [provider]  ranitidine (ZANTAC) 150 MG tablet Take 150 mg by mouth 2 (two) times daily.    Yes [provider]      VITAL SIGNS:  Blood pressure 124/68, pulse (!) 58, temperature 98.5 F (36.9 C), temperature source Oral, resp. rate 16, height 5\' 8"  (1.727 m), weight 108.9 kg, SpO2 96 %.  PHYSICAL EXAMINATION:  Physical Exam Vitals signs and nursing note reviewed.  Constitutional:      General: He is not in acute distress.    Appearance: He is well-developed. He is not ill-appearing or diaphoretic.  HENT:     Head: Normocephalic and atraumatic.  Eyes:     Extraocular Movements: Extraocular movements intact.     Pupils: Pupils are equal, round, and reactive to light.  Neck:     Musculoskeletal: Normal range of motion and neck supple.     Vascular: No JVD.  Cardiovascular:     Rate and Rhythm: Normal rate and regular rhythm.     Pulses:          Carotid pulses are 2+ on the right side and 2+ on the left side.      Radial pulses are 2+ on the right side and 2+ on the left side.       Dorsalis pedis pulses are 2+ on the right side and 2+ on the left side.       Posterior tibial pulses are 2+ on the right side and 2+ on the left  side.     Heart sounds: Murmur present. Systolic murmur present with a grade of 2/6. No diastolic murmur. No friction rub. No gallop.   Pulmonary:     Effort: Pulmonary effort is normal. No tachypnea, accessory muscle usage or respiratory distress.     Breath sounds: Normal breath sounds. No decreased breath sounds, wheezing, rhonchi or rales.  Chest:     Chest wall: No tenderness.  Abdominal:     General: Bowel sounds are normal.     Palpations: Abdomen is soft. There is no mass.  Musculoskeletal: Normal range of motion.     Right lower leg: No edema.     Left lower leg: No edema.  Skin:    General: Skin is warm and dry.     Capillary Refill: Capillary refill takes less than 2 seconds.     Findings: No  rash.  Neurological:     General: No focal deficit present.     Mental Status: He is alert and oriented to person, place, and time.     Motor: No weakness.  Psychiatric:        Mood and Affect: Mood normal.        Behavior: Behavior normal.       LABORATORY PANEL:   CBC Recent Labs  Lab 10/22/18 0236  WBC 10.3  HGB 11.8*  HCT 35.9*  PLT 317   ------------------------------------------------------------------------------------------------------------------  Chemistries  Recent Labs  Lab 10/22/18 0236  NA 136  K 4.9  CL 104  CO2 24  GLUCOSE 118*  BUN 20  CREATININE 1.46*  CALCIUM 9.5   ------------------------------------------------------------------------------------------------------------------  Cardiac Enzymes Recent Labs  Lab 10/22/18 0236  TROPONINI <0.03   ------------------------------------------------------------------------------------------------------------------  RADIOLOGY:  Dg Chest Port 1 View  Result Date: 10/22/2018 CLINICAL DATA:  73 year old male with chest pain tonight. EXAM: PORTABLE CHEST 1 VIEW COMPARISON:  Portable chest 10/19/2018 and earlier. FINDINGS: Portable AP upright view at 0229 hours. Previous sternotomy and CABG.  Stable cardiac size and mediastinal contours. Stable lung volumes. Allowing for portable technique the lungs are clear. Visualized tracheal air column is within normal limits. No pneumothorax. Stable visualized osseous structures. IMPRESSION: No acute cardiopulmonary abnormality. Electronically Signed   By: Genevie Ann M.D.   On: 10/22/2018 03:12      IMPRESSION AND PLAN:   1.  Atypical chest pain - With history of coronary artery disease followed by Dr.Callwood. - Continue to trend troponin levels - Repeat EKG every 6 hours - Aspirin and statin therapy have been resumed - We will continue to monitor patient closely with troponin levels and repeated EKGs.  Will consult cardiology if acute changes noted.  Patient reports he is scheduled to follow-up with his cardiologist at the beginning of next week.  2.  Hypertension - Norvasc and lisinopril continued - We will treat persistent hypertension expectantly  3.  Diabetes mellitus - Moderate sliding scale insulin - Metformin continued - We will get hemoglobin A1c in the a.m.  4. hyperlipidemia - Statin therapy continue -Lipid panel pending  5.  CKD --With creatinine 1.46 and BUN 20 --Will repeat BMP in the a.m. and continue to monitor renal function  DVT and PPI prophylaxis have been initiated   All the records are reviewed and case discussed with ED provider. The plan of care was discussed in details with the patient (and family). I answered all questions. The patient agreed to proceed with the above mentioned plan. Further management will depend upon hospital course.   CODE STATUS: Full code  TOTAL TIME TAKING CARE OF THIS PATIENT: 45 minutes.    [place] on 10/22/2018 at 5:56 AM  Pager - [phone]  After 6pm go to [email or web address] - Proofreader  [facility]  Office  [phone]  CC: Primary care physician; [facility] [place]   Attending physician  admission note:  I have seen and examined the patient with Ms. Gardiner Barefoot, CRNP.  The patient presents with left parasternal chest pain felt as pressure as if somebody is lying on his chest with no nausea vomiting or diaphoresis or dyspnea or palpitations or radiation of his pain.  No cough or shortness of breath or fever or chills.  No recent sick exposures.  Upon physical examination:  Generally: Pleasant middle-aged gentleman in no acute distress. Cardiovascular: Regular rate and rhythm with normal S1-S2  and no murmurs gallops or rubs. Respiratory: Clear to auscultation bilaterally Abdomen: Soft, nontender, nondistended with positive bowel sounds and no palpable organomegaly or masses. Extremities: No edema clubbing or cyanosis  Labs and radiographic studies: were all reviewed  Assessment/plan: Chest pain, rule out acute coronary syndrome.  The patient will be admitted to an observation telemetry bed.  Will follow serial cardiac enzymes and EKGs.  We will obtain a cardiology consult in a.m. for further cardiac risk stratification.  The patient will be placed on aspirin as well as p.r.n. sublingual nitroglycerin and morphine sulfate for pain.  For further details please refer to dictated admission H&P.  I have discussed the case with my nurse practitioner. I agree with the admission note and the rest of the plan of care as delineated by Ms. Gardiner Barefoot, CRNP.    Note: This dictation was prepared with Dragon dictation along with smaller phrase technology. Any transcriptional errors that result from this process are unintentional.

## 2018-10-22 NOTE — Progress Notes (Signed)
Advanced care plan. Purpose of the Encounter: CODE STATUS Parties in Attendance: Patient Patient's Decision Capacity: Good Subjective/Patient's story: WilliamGravesis a73 y.o.malewith a known history of coronary artery disease with coronary artery bypass graft x2 in 2005. Patient is followed by Dr. Doran Clay cardiology. He presents emergency room complaining of midsternal chest pain described as heaviness which woke him from sleep around 10p.m. on 10/21/2018. He noted associated shortness of breath however no nausea, vomiting, diaphoresis. He noted left anterior radiation of the pain. Pain lasted for about 5 minutes with a pain score 8 out of 10. Patient took 381 mg aspirins and 1 nitroglycerin with resolution of the pain at that time. However pain returned within 15 to 30 minutes with the same intensity. Initial troponin is less than 0.03. Potassium is 4.9. Chest x-ray demonstrated no acute disease. No acute findings on EKG. Patient was seen 4 days prior with similar complaint evaluated and discharged home at that time. Given his repeated history of radiating chest pain, we have admitted him to the hospitalist service for further management and observation. Objective/Medical story Patient needs serial troponin, cardiology evaluation. Goals of care determination:  Advance care directives and goals of care discussed Patient wants everything done which includes cpr, intubation and ventilator if need arises. CODE STATUS:Full code  Time spent discussing advanced care planning: 16 minutes

## 2018-10-22 NOTE — ED Notes (Signed)
Pt given meal tray.

## 2018-10-22 NOTE — ED Notes (Signed)
Called accepting RN, she did not have any questions. Pt will be transferred to the floor.

## 2018-11-04 ENCOUNTER — Encounter: Payer: Self-pay | Admitting: *Deleted

## 2018-12-21 ENCOUNTER — Other Ambulatory Visit: Payer: Self-pay

## 2018-12-21 ENCOUNTER — Encounter: Payer: Self-pay | Admitting: Gastroenterology

## 2018-12-21 ENCOUNTER — Ambulatory Visit (INDEPENDENT_AMBULATORY_CARE_PROVIDER_SITE_OTHER): Payer: Medicare HMO | Admitting: Gastroenterology

## 2018-12-21 ENCOUNTER — Encounter (INDEPENDENT_AMBULATORY_CARE_PROVIDER_SITE_OTHER): Payer: Self-pay

## 2018-12-21 VITALS — BP 110/66 | HR 67 | Temp 98.1°F | Ht 68.0 in | Wt 241.0 lb

## 2018-12-21 DIAGNOSIS — R4702 Dysphasia: Secondary | ICD-10-CM | POA: Diagnosis not present

## 2018-12-21 NOTE — Progress Notes (Signed)
Primary Care Physician: [facility] [place]  Primary Gastroenterologist:  Dr. Lucilla Lame  Chief Complaint  Patient presents with  . Gastroesophageal Reflux    HPI: Justin Keith is a 73 y.o. male here for follow-up after having a food bolus removed by Dr. Vicente Males.  The patient denies any problems at the present time except some abdominal pain.  The patient states abdominal pain is worse when he moves around or bends over.  Is not usually related to any food that he eats.  There is no report of any unexplained weight loss fevers chills nausea vomiting black stools or bloody stools.  The patient reports that he did not have his repeat EGD with dilation of esophagus stricture because he did not have a ride.  Current Outpatient Medications  Medication Sig Dispense Refill  . amLODipine (NORVASC) 10 MG tablet Take 1 tablet (10 mg total) by mouth daily. 30 tablet 0  . aspirin 81 MG chewable tablet Chew 1 tablet (81 mg total) by mouth daily. 30 tablet 0  . atorvastatin (LIPITOR) 80 MG tablet Take 1 tablet by mouth daily.    Marland Kitchen lisinopril (PRINIVIL,ZESTRIL) 10 MG tablet Take 1 tablet (10 mg total) by mouth daily. 30 tablet 0  . metFORMIN (GLUCOPHAGE-XR) 500 MG 24 hr tablet Take 500 mg by mouth 2 (two) times daily.     . nitroGLYCERIN (NITROSTAT) 0.4 MG SL tablet Place 0.4 mg under the tongue every 5 (five) minutes as needed for chest pain.    . ranitidine (ZANTAC) 150 MG tablet Take 150 mg by mouth 2 (two) times daily.      No current facility-administered medications for this visit.     Allergies as of 12/21/2018  . (No Known Allergies)    ROS:  General: Negative for anorexia, weight loss, fever, chills, fatigue, weakness. ENT: Negative for hoarseness, difficulty swallowing , nasal congestion. CV: Negative for chest pain, angina, palpitations, dyspnea on exertion, peripheral edema.  Respiratory: Negative for dyspnea at rest, dyspnea on exertion, cough, sputum,  wheezing.  GI: See history of present illness. GU:  Negative for dysuria, hematuria, urinary incontinence, urinary frequency, nocturnal urination.  Endo: Negative for unusual weight change.    Physical Examination:   BP 110/66   Pulse 67   Temp 98.1 F (36.7 C) (Oral)   Ht 5\' 8"  (1.727 m)   Wt 241 lb (109.3 kg)   BMI 36.64 kg/m   General: Well-nourished, well-developed in no acute distress.  Eyes: No icterus. Conjunctivae pink. Mouth: Oropharyngeal mucosa moist and pink , no lesions erythema or exudate. Lungs: Clear to auscultation bilaterally. Non-labored. Heart: Regular rate and rhythm, no murmurs rubs or gallops.  Abdomen: Bowel sounds are normal, mild tenderness at the ventral hernia seen, nondistended, no hepatosplenomegaly or masses, no abdominal bruits or hernia , no rebound or guarding.   Extremities: No lower extremity edema. No clubbing or deformities. Neuro: Alert and oriented x 3.  Grossly intact. Skin: Warm and dry, no jaundice.   Psych: Alert and cooperative, normal mood and affect.  Labs:    Imaging Studies: No results found.  Assessment and Plan:   Justin Keith is a 73 y.o. y/o male who comes in with a history of esophageal food bolus that was removed by Dr. Vicente Males.  The patient never followed up after having the food bolus removed.  The patient has been told that he needs a repeat EGD.  He is also been explained that his abdominal  pain is from his hernia.  The hernia is not incarcerated and at the ventral hernia that is easily reduced.  The patient will be set up for an EGD due to his dysphagia and food impaction in the past. I have discussed risks & benefits which include, but are not limited to, bleeding, infection, perforation & drug reaction.  The patient agrees with this plan & written consent will be obtained.       Lucilla Lame, MD. Marval Regal   Note: This dictation was prepared with Dragon dictation along with smaller phrase technology. Any  transcriptional errors that result from this process are unintentional.

## 2018-12-21 NOTE — H&P (View-Only) (Signed)
Primary Care Physician: [facility] [place]  Primary Gastroenterologist:  Dr. Lucilla Lame  Chief Complaint  Patient presents with  . Gastroesophageal Reflux    HPI: Justin Keith is a 73 y.o. male here for follow-up after having a food bolus removed by Dr. Vicente Males.  The patient denies any problems at the present time except some abdominal pain.  The patient states abdominal pain is worse when he moves around or bends over.  Is not usually related to any food that he eats.  There is no report of any unexplained weight loss fevers chills nausea vomiting black stools or bloody stools.  The patient reports that he did not have his repeat EGD with dilation of esophagus stricture because he did not have a ride.  Current Outpatient Medications  Medication Sig Dispense Refill  . amLODipine (NORVASC) 10 MG tablet Take 1 tablet (10 mg total) by mouth daily. 30 tablet 0  . aspirin 81 MG chewable tablet Chew 1 tablet (81 mg total) by mouth daily. 30 tablet 0  . atorvastatin (LIPITOR) 80 MG tablet Take 1 tablet by mouth daily.    Marland Kitchen lisinopril (PRINIVIL,ZESTRIL) 10 MG tablet Take 1 tablet (10 mg total) by mouth daily. 30 tablet 0  . metFORMIN (GLUCOPHAGE-XR) 500 MG 24 hr tablet Take 500 mg by mouth 2 (two) times daily.     . nitroGLYCERIN (NITROSTAT) 0.4 MG SL tablet Place 0.4 mg under the tongue every 5 (five) minutes as needed for chest pain.    . ranitidine (ZANTAC) 150 MG tablet Take 150 mg by mouth 2 (two) times daily.      No current facility-administered medications for this visit.     Allergies as of 12/21/2018  . (No Known Allergies)    ROS:  General: Negative for anorexia, weight loss, fever, chills, fatigue, weakness. ENT: Negative for hoarseness, difficulty swallowing , nasal congestion. CV: Negative for chest pain, angina, palpitations, dyspnea on exertion, peripheral edema.  Respiratory: Negative for dyspnea at rest, dyspnea on exertion, cough, sputum,  wheezing.  GI: See history of present illness. GU:  Negative for dysuria, hematuria, urinary incontinence, urinary frequency, nocturnal urination.  Endo: Negative for unusual weight change.    Physical Examination:   BP 110/66   Pulse 67   Temp 98.1 F (36.7 C) (Oral)   Ht 5\' 8"  (1.727 m)   Wt 241 lb (109.3 kg)   BMI 36.64 kg/m   General: Well-nourished, well-developed in no acute distress.  Eyes: No icterus. Conjunctivae pink. Mouth: Oropharyngeal mucosa moist and pink , no lesions erythema or exudate. Lungs: Clear to auscultation bilaterally. Non-labored. Heart: Regular rate and rhythm, no murmurs rubs or gallops.  Abdomen: Bowel sounds are normal, mild tenderness at the ventral hernia seen, nondistended, no hepatosplenomegaly or masses, no abdominal bruits or hernia , no rebound or guarding.   Extremities: No lower extremity edema. No clubbing or deformities. Neuro: Alert and oriented x 3.  Grossly intact. Skin: Warm and dry, no jaundice.   Psych: Alert and cooperative, normal mood and affect.  Labs:    Imaging Studies: No results found.  Assessment and Plan:   Justin Keith is a 73 y.o. y/o male who comes in with a history of esophageal food bolus that was removed by Dr. Vicente Males.  The patient never followed up after having the food bolus removed.  The patient has been told that he needs a repeat EGD.  He is also been explained that his abdominal  pain is from his hernia.  The hernia is not incarcerated and at the ventral hernia that is easily reduced.  The patient will be set up for an EGD due to his dysphagia and food impaction in the past. I have discussed risks & benefits which include, but are not limited to, bleeding, infection, perforation & drug reaction.  The patient agrees with this plan & written consent will be obtained.       Lucilla Lame, MD. Marval Regal   Note: This dictation was prepared with Dragon dictation along with smaller phrase technology. Any  transcriptional errors that result from this process are unintentional.

## 2018-12-22 ENCOUNTER — Other Ambulatory Visit: Payer: Self-pay

## 2018-12-22 DIAGNOSIS — R4702 Dysphasia: Secondary | ICD-10-CM

## 2019-01-14 ENCOUNTER — Other Ambulatory Visit: Payer: Self-pay

## 2019-01-14 ENCOUNTER — Other Ambulatory Visit
Admission: RE | Admit: 2019-01-14 | Discharge: 2019-01-14 | Disposition: A | Discharge status: Home / Self Care | Payer: Medicare HMO | Source: Ambulatory Visit | Attending: Gastroenterology | Admitting: Gastroenterology

## 2019-01-14 DIAGNOSIS — Z01812 Encounter for preprocedural laboratory examination: Secondary | ICD-10-CM | POA: Diagnosis present

## 2019-01-14 DIAGNOSIS — Z20828 Contact with and (suspected) exposure to other viral communicable diseases: Secondary | ICD-10-CM | POA: Insufficient documentation

## 2019-01-14 LAB — SARS CORONAVIRUS 2 (TAT 6-24 HRS): SARS Coronavirus 2: NEGATIVE

## 2019-01-17 ENCOUNTER — Encounter: Payer: Self-pay | Admitting: *Deleted

## 2019-01-18 ENCOUNTER — Encounter
Admission: RE | Disposition: A | Discharge status: Home / Self Care | Payer: Self-pay | Source: Home / Self Care | Attending: Gastroenterology

## 2019-01-18 ENCOUNTER — Encounter: Payer: Self-pay | Admitting: Anesthesiology

## 2019-01-18 ENCOUNTER — Ambulatory Visit: Payer: Medicare HMO | Admitting: Anesthesiology

## 2019-01-18 ENCOUNTER — Ambulatory Visit
Admission: RE | Admit: 2019-01-18 | Discharge: 2019-01-18 | Disposition: A | Discharge status: Home / Self Care | Payer: Medicare HMO | Attending: Gastroenterology | Admitting: Gastroenterology

## 2019-01-18 DIAGNOSIS — E119 Type 2 diabetes mellitus without complications: Secondary | ICD-10-CM | POA: Insufficient documentation

## 2019-01-18 DIAGNOSIS — Z951 Presence of aortocoronary bypass graft: Secondary | ICD-10-CM | POA: Insufficient documentation

## 2019-01-18 DIAGNOSIS — K219 Gastro-esophageal reflux disease without esophagitis: Secondary | ICD-10-CM | POA: Diagnosis not present

## 2019-01-18 DIAGNOSIS — Z79899 Other long term (current) drug therapy: Secondary | ICD-10-CM | POA: Insufficient documentation

## 2019-01-18 DIAGNOSIS — Z7982 Long term (current) use of aspirin: Secondary | ICD-10-CM | POA: Insufficient documentation

## 2019-01-18 DIAGNOSIS — I2510 Atherosclerotic heart disease of native coronary artery without angina pectoris: Secondary | ICD-10-CM | POA: Diagnosis not present

## 2019-01-18 DIAGNOSIS — Z955 Presence of coronary angioplasty implant and graft: Secondary | ICD-10-CM | POA: Diagnosis not present

## 2019-01-18 DIAGNOSIS — K222 Esophageal obstruction: Secondary | ICD-10-CM | POA: Diagnosis not present

## 2019-01-18 DIAGNOSIS — I10 Essential (primary) hypertension: Secondary | ICD-10-CM | POA: Diagnosis not present

## 2019-01-18 DIAGNOSIS — Z7984 Long term (current) use of oral hypoglycemic drugs: Secondary | ICD-10-CM | POA: Insufficient documentation

## 2019-01-18 DIAGNOSIS — R1310 Dysphagia, unspecified: Secondary | ICD-10-CM | POA: Diagnosis present

## 2019-01-18 DIAGNOSIS — I252 Old myocardial infarction: Secondary | ICD-10-CM | POA: Insufficient documentation

## 2019-01-18 DIAGNOSIS — R4702 Dysphasia: Secondary | ICD-10-CM

## 2019-01-18 HISTORY — PX: ESOPHAGOGASTRODUODENOSCOPY (EGD) WITH PROPOFOL: SHX5813

## 2019-01-18 LAB — GLUCOSE, CAPILLARY: Glucose-Capillary: 138 mg/dL — ABNORMAL HIGH (ref 70–99)

## 2019-01-18 SURGERY — ESOPHAGOGASTRODUODENOSCOPY (EGD) WITH PROPOFOL
Anesthesia: General

## 2019-01-18 MED ORDER — PANTOPRAZOLE SODIUM 40 MG PO TBEC: 40.0000 mg | DELAYED_RELEASE_TABLET | Freq: Every day | ORAL | 1 refills | Status: AC

## 2019-01-18 MED ORDER — PROPOFOL 10 MG/ML IV BOLUS
INTRAVENOUS | Status: DC | PRN
  Administered 2019-01-18: 80 mg via INTRAVENOUS
  Administered 2019-01-18: 20 mg via INTRAVENOUS

## 2019-01-18 MED ORDER — LIDOCAINE HCL (PF) 2 % IJ SOLN
INTRAMUSCULAR | Status: AC
  Filled 2019-01-18: qty 10

## 2019-01-18 MED ORDER — PROPOFOL 500 MG/50ML IV EMUL
INTRAVENOUS | Status: DC | PRN
  Administered 2019-01-18: 130 ug/kg/min via INTRAVENOUS

## 2019-01-18 MED ORDER — LIDOCAINE HCL (CARDIAC) PF 100 MG/5ML IV SOSY
PREFILLED_SYRINGE | INTRAVENOUS | Status: DC | PRN
  Administered 2019-01-18: 50 mg via INTRAVENOUS

## 2019-01-18 MED ORDER — SODIUM CHLORIDE 0.9 % IV SOLN
INTRAVENOUS | Status: DC
  Administered 2019-01-18: 09:00:00 1000 mL via INTRAVENOUS

## 2019-01-18 MED ORDER — PROPOFOL 500 MG/50ML IV EMUL
INTRAVENOUS | Status: AC
  Filled 2019-01-18: qty 50

## 2019-01-18 NOTE — Anesthesia Post-op Follow-up Note (Signed)
Anesthesia QCDR form completed.        

## 2019-01-18 NOTE — Anesthesia Preprocedure Evaluation (Addendum)
Anesthesia Evaluation  Patient identified by MRN, date of birth, ID band Patient awake    Reviewed: Allergy & Precautions, NPO status , Patient's Chart, lab work & pertinent test results, reviewed documented beta blocker date and time   Airway Mallampati: II  TM Distance: >3 FB     Dental  (+) Chipped   Pulmonary           Cardiovascular hypertension, Pt. on medications + CAD, + Past MI, + Cardiac Stents and + CABG       Neuro/Psych    GI/Hepatic GERD  ,  Endo/Other  diabetes, Type 2  Renal/GU Renal disease     Musculoskeletal   Abdominal   Peds  Hematology   Anesthesia Other Findings EKG checked and ok. MI in 1999. CABG in 2005. No cardiac symptoms now.  Reproductive/Obstetrics                           Anesthesia Physical Anesthesia Plan  ASA: III  Anesthesia Plan: General   Post-op Pain Management:    Induction: Intravenous  PONV Risk Score and Plan:   Airway Management Planned:   Additional Equipment:   Intra-op Plan:   Post-operative Plan:   Informed Consent: I have reviewed the patients History and Physical, chart, labs and discussed the procedure including the risks, benefits and alternatives for the proposed anesthesia with the patient or authorized representative who has indicated his/her understanding and acceptance.       Plan Discussed with: CRNA  Anesthesia Plan Comments:         Anesthesia Quick Evaluation

## 2019-01-18 NOTE — Op Note (Signed)
[facility] Gastroenterology Patient Name: Justin Keith Procedure Date: 01/18/2019 8:41 AM MRN: [id number] Account #: [id number] Date of Birth: May 27, 1946 Admit Type: Outpatient Age: 73 Room: [facility] ENDO ROOM 4 Gender: Male Note Status: Finalized Procedure:            Upper GI endoscopy Indications:          Dysphagia Providers:            Lucilla Lame MD, MD Medicines:            Propofol per Anesthesia Complications:        No immediate complications. Procedure:            Pre-Anesthesia Assessment:                       - Prior to the procedure, a History and Physical was                        performed, and patient medications and allergies were                        reviewed. The patient's tolerance of previous                        anesthesia was also reviewed. The risks and benefits of                        the procedure and the sedation options and risks were                        discussed with the patient. All questions were                        answered, and informed consent was obtained. Prior                        Anticoagulants: The patient has taken no previous                        anticoagulant or antiplatelet agents. ASA Grade                        Assessment: II - A patient with mild systemic disease.                        After reviewing the risks and benefits, the patient was                        deemed in satisfactory condition to undergo the                        procedure.                       After obtaining informed consent, the endoscope was                        passed under direct vision. Throughout the procedure,                        the patient's blood pressure, pulse, and  oxygen                        saturations were monitored continuously. The Endoscope                        was introduced through the mouth, and advanced to the                        second part of duodenum. The upper GI endoscopy was               accomplished without difficulty. The patient tolerated                        the procedure well. Findings:      One benign-appearing, intrinsic moderate stenosis was found at the       gastroesophageal junction. The stenosis was traversed. A TTS dilator was       passed through the scope. Dilation with a 15-16.5-18 mm balloon dilator       was performed to 18 mm. The dilation site was examined following       endoscope reinsertion and showed complete resolution of luminal       narrowing.      The stomach was normal.      The examined duodenum was normal. Impression:           - Benign-appearing esophageal stenosis. Dilated.                       - Normal stomach.                       - Normal examined duodenum.                       - No specimens collected. Recommendation:       - Discharge patient to home.                       - Resume previous diet.                       - Continue present medications.                       - Use a proton pump inhibitor PO daily.                       - Repeat upper endoscopy PRN for retreatment. Procedure Code(s):    --- Professional ---                       [phone], Esophagogastroduodenoscopy, flexible, transoral;                        with transendoscopic balloon dilation of esophagus                        (less than 30 mm diameter) Diagnosis Code(s):    --- Professional ---                       R13.10, Dysphagia, unspecified CPT copyright 2019 [employer]. All rights reserved. The codes documented in this report  are preliminary and upon coder review may  be revised to meet current compliance requirements. Lucilla Lame MD, MD 01/18/2019 9:13:08 AM This report has been signed electronically. Number of Addenda: 0 Note Initiated On: 01/18/2019 8:41 AM Estimated Blood Loss: Estimated blood loss: none.      [facility]

## 2019-01-18 NOTE — Transfer of Care (Signed)
Immediate Anesthesia Transfer of Care Note  Patient: Justin Keith  Procedure(s) Performed: ESOPHAGOGASTRODUODENOSCOPY (EGD) WITH PROPOFOL (N/A )  Patient Location: PACU and Endoscopy Unit  Anesthesia Type:General  Level of Consciousness: drowsy  Airway & Oxygen Therapy: Patient Spontanous Breathing  Post-op Assessment: Report given to RN and Post -op Vital signs reviewed and stable  Post vital signs: Reviewed and stable  Last Vitals:  Vitals Value Taken Time  BP 101/60 01/18/19 0916  Temp 36.7 C 01/18/19 0915  Pulse 55 01/18/19 0916  Resp 23 01/18/19 0916  SpO2 93 % 01/18/19 0916  Vitals shown include unvalidated device data.  Last Pain:  Vitals:   01/18/19 0915  TempSrc: Tympanic  PainSc:          Complications: No apparent anesthesia complications

## 2019-01-18 NOTE — Interval H&P Note (Signed)
History and Physical Interval Note:  01/18/2019 9:03 AM  Justin Keith  has presented today for surgery, with the diagnosis of dysphagia R13.10.  The various methods of treatment have been discussed with the patient and family. After consideration of risks, benefits and other options for treatment, the patient has consented to  Procedure(s): ESOPHAGOGASTRODUODENOSCOPY (EGD) WITH PROPOFOL (N/A) as a surgical intervention.  The patient's history has been reviewed, patient examined, no change in status, stable for surgery.  I have reviewed the patient's chart and labs.  Questions were answered to the patient's satisfaction.     Rakhi Romagnoli [employer]

## 2019-01-18 NOTE — Anesthesia Postprocedure Evaluation (Signed)
Anesthesia Post Note  Patient: Justin Keith  Procedure(s) Performed: ESOPHAGOGASTRODUODENOSCOPY (EGD) WITH PROPOFOL (N/A )  Patient location during evaluation: Endoscopy Anesthesia Type: General Level of consciousness: awake and alert Pain management: pain level controlled Vital Signs Assessment: post-procedure vital signs reviewed and stable Respiratory status: spontaneous breathing, nonlabored ventilation, respiratory function stable and patient connected to nasal cannula oxygen Cardiovascular status: blood pressure returned to baseline and stable Postop Assessment: no apparent nausea or vomiting Anesthetic complications: no     Last Vitals:  Vitals:   01/18/19 0915 01/18/19 0935  BP:  101/78  Pulse:    Resp:    Temp: 36.7 C   SpO2:      Last Pain:  Vitals:   01/18/19 0925  TempSrc:   PainSc: 0-No pain                 Margeret Stachnik S

## 2019-03-15 ENCOUNTER — Emergency Department: Payer: Medicare HMO

## 2019-03-15 ENCOUNTER — Observation Stay
Admission: EM | Admit: 2019-03-15 | Discharge: 2019-03-16 | Disposition: A | Discharge status: Home / Self Care | Payer: Medicare HMO | Attending: Internal Medicine | Admitting: Internal Medicine

## 2019-03-15 ENCOUNTER — Other Ambulatory Visit: Payer: Self-pay

## 2019-03-15 DIAGNOSIS — E785 Hyperlipidemia, unspecified: Secondary | ICD-10-CM | POA: Insufficient documentation

## 2019-03-15 DIAGNOSIS — K219 Gastro-esophageal reflux disease without esophagitis: Secondary | ICD-10-CM | POA: Diagnosis not present

## 2019-03-15 DIAGNOSIS — R079 Chest pain, unspecified: Secondary | ICD-10-CM | POA: Diagnosis present

## 2019-03-15 DIAGNOSIS — Z7982 Long term (current) use of aspirin: Secondary | ICD-10-CM | POA: Insufficient documentation

## 2019-03-15 DIAGNOSIS — Z951 Presence of aortocoronary bypass graft: Secondary | ICD-10-CM | POA: Insufficient documentation

## 2019-03-15 DIAGNOSIS — Z20828 Contact with and (suspected) exposure to other viral communicable diseases: Secondary | ICD-10-CM | POA: Insufficient documentation

## 2019-03-15 DIAGNOSIS — I1310 Hypertensive heart and chronic kidney disease without heart failure, with stage 1 through stage 4 chronic kidney disease, or unspecified chronic kidney disease: Secondary | ICD-10-CM | POA: Insufficient documentation

## 2019-03-15 DIAGNOSIS — N189 Chronic kidney disease, unspecified: Secondary | ICD-10-CM | POA: Insufficient documentation

## 2019-03-15 DIAGNOSIS — I209 Angina pectoris, unspecified: Secondary | ICD-10-CM | POA: Diagnosis present

## 2019-03-15 DIAGNOSIS — Z955 Presence of coronary angioplasty implant and graft: Secondary | ICD-10-CM | POA: Insufficient documentation

## 2019-03-15 DIAGNOSIS — Z79899 Other long term (current) drug therapy: Secondary | ICD-10-CM | POA: Insufficient documentation

## 2019-03-15 DIAGNOSIS — I5023 Acute on chronic systolic (congestive) heart failure: Secondary | ICD-10-CM

## 2019-03-15 DIAGNOSIS — Z7984 Long term (current) use of oral hypoglycemic drugs: Secondary | ICD-10-CM | POA: Insufficient documentation

## 2019-03-15 DIAGNOSIS — I2510 Atherosclerotic heart disease of native coronary artery without angina pectoris: Secondary | ICD-10-CM | POA: Diagnosis not present

## 2019-03-15 DIAGNOSIS — E1122 Type 2 diabetes mellitus with diabetic chronic kidney disease: Secondary | ICD-10-CM | POA: Diagnosis not present

## 2019-03-15 LAB — COMPREHENSIVE METABOLIC PANEL
ALT: 21 U/L (ref 0–44)
AST: 25 U/L (ref 15–41)
Albumin: 3.7 g/dL (ref 3.5–5.0)
Alkaline Phosphatase: 56 U/L (ref 38–126)
Anion gap: 9 (ref 5–15)
BUN: 23 mg/dL (ref 8–23)
CO2: 22 mmol/L (ref 22–32)
Calcium: 9.1 mg/dL (ref 8.9–10.3)
Chloride: 105 mmol/L (ref 98–111)
Creatinine, Ser: 1.39 mg/dL — ABNORMAL HIGH (ref 0.61–1.24)
GFR calc Af Amer: 58 mL/min — ABNORMAL LOW (ref >60)
GFR calc non Af Amer: 50 mL/min — ABNORMAL LOW (ref >60)
Glucose, Bld: 227 mg/dL — ABNORMAL HIGH (ref 70–99)
Potassium: 4.4 mmol/L (ref 3.5–5.1)
Sodium: 136 mmol/L (ref 135–145)
Total Bilirubin: 0.4 mg/dL (ref 0.3–1.2)
Total Protein: 7.3 g/dL (ref 6.5–8.1)

## 2019-03-15 LAB — CBC WITH DIFFERENTIAL/PLATELET
Abs Immature Granulocytes: 0.04 10*3/uL (ref 0.00–0.07)
Basophils Absolute: 0.1 10*3/uL (ref 0.0–0.1)
Basophils Relative: 1 %
Eosinophils Absolute: 0.3 10*3/uL (ref 0.0–0.5)
Eosinophils Relative: 3 %
HCT: 32.3 % — ABNORMAL LOW (ref 39.0–52.0)
Hemoglobin: 10.6 g/dL — ABNORMAL LOW (ref 13.0–17.0)
Immature Granulocytes: 0 %
Lymphocytes Relative: 25 %
Lymphs Abs: 2.6 10*3/uL (ref 0.7–4.0)
MCH: 29.9 pg (ref 26.0–34.0)
MCHC: 32.8 g/dL (ref 30.0–36.0)
MCV: 91.2 fL (ref 80.0–100.0)
Monocytes Absolute: 0.9 10*3/uL (ref 0.1–1.0)
Monocytes Relative: 9 %
Neutro Abs: 6.4 10*3/uL (ref 1.7–7.7)
Neutrophils Relative %: 62 %
Platelets: 314 10*3/uL (ref 150–400)
RBC: 3.54 MIL/uL — ABNORMAL LOW (ref 4.22–5.81)
RDW: 13.9 % (ref 11.5–15.5)
WBC: 10.2 10*3/uL (ref 4.0–10.5)
nRBC: 0 % (ref 0.0–0.2)

## 2019-03-15 LAB — LIPID PANEL
Cholesterol: 170 mg/dL (ref 0–200)
HDL: 44 mg/dL (ref >40)
LDL Cholesterol: 103 mg/dL — ABNORMAL HIGH (ref 0–99)
Total CHOL/HDL Ratio: 3.9 RATIO
Triglycerides: 115 mg/dL (ref <150)
VLDL: 23 mg/dL (ref 0–40)

## 2019-03-15 LAB — GLUCOSE, CAPILLARY
Glucose-Capillary: 124 mg/dL — ABNORMAL HIGH (ref 70–99)
Glucose-Capillary: 154 mg/dL — ABNORMAL HIGH (ref 70–99)
Glucose-Capillary: 188 mg/dL — ABNORMAL HIGH (ref 70–99)
Glucose-Capillary: 124 mg/dL — ABNORMAL HIGH (ref 70–99)

## 2019-03-15 LAB — BASIC METABOLIC PANEL
Anion gap: 11 (ref 5–15)
BUN: 22 mg/dL (ref 8–23)
CO2: 23 mmol/L (ref 22–32)
Calcium: 9.4 mg/dL (ref 8.9–10.3)
Chloride: 105 mmol/L (ref 98–111)
Creatinine, Ser: 1.41 mg/dL — ABNORMAL HIGH (ref 0.61–1.24)
GFR calc Af Amer: 57 mL/min — ABNORMAL LOW (ref >60)
GFR calc non Af Amer: 49 mL/min — ABNORMAL LOW (ref >60)
Glucose, Bld: 139 mg/dL — ABNORMAL HIGH (ref 70–99)
Potassium: 4.3 mmol/L (ref 3.5–5.1)
Sodium: 139 mmol/L (ref 135–145)

## 2019-03-15 LAB — TROPONIN I (HIGH SENSITIVITY)
Troponin I (High Sensitivity): 13 ng/L (ref <18)
Troponin I (High Sensitivity): 14 ng/L (ref <18)

## 2019-03-15 LAB — SARS CORONAVIRUS 2 (TAT 6-24 HRS): SARS Coronavirus 2: NEGATIVE

## 2019-03-15 LAB — HEMOGLOBIN A1C
Hgb A1c MFr Bld: 7.7 % — ABNORMAL HIGH (ref 4.8–5.6)
Mean Plasma Glucose: 174.29 mg/dL

## 2019-03-15 LAB — LIPASE, BLOOD: Lipase: 29 U/L (ref 11–51)

## 2019-03-15 MED ORDER — LISINOPRIL 10 MG PO TABS
10.0000 mg | ORAL_TABLET | Freq: Every day | ORAL | Status: DC
  Administered 2019-03-15 – 2019-03-16 (×2): 10 mg via ORAL
  Filled 2019-03-15 (×2): qty 1

## 2019-03-15 MED ORDER — AMLODIPINE BESYLATE 10 MG PO TABS
10.0000 mg | ORAL_TABLET | Freq: Every day | ORAL | Status: DC
  Administered 2019-03-15 – 2019-03-16 (×2): 10 mg via ORAL
  Filled 2019-03-15: qty 2
  Filled 2019-03-15: qty 1

## 2019-03-15 MED ORDER — ONDANSETRON HCL 4 MG/2ML IJ SOLN: 4.0000 mg | Freq: Once | INTRAMUSCULAR | Status: DC

## 2019-03-15 MED ORDER — ACETAMINOPHEN 325 MG PO TABS: 650.0000 mg | ORAL_TABLET | ORAL | Status: DC | PRN

## 2019-03-15 MED ORDER — NITROGLYCERIN 0.4 MG SL SUBL: 0.4000 mg | SUBLINGUAL_TABLET | SUBLINGUAL | Status: DC | PRN

## 2019-03-15 MED ORDER — PANTOPRAZOLE SODIUM 40 MG PO TBEC
40.0000 mg | DELAYED_RELEASE_TABLET | Freq: Every day | ORAL | Status: DC
  Administered 2019-03-15 – 2019-03-16 (×2): 40 mg via ORAL
  Filled 2019-03-15 (×2): qty 1

## 2019-03-15 MED ORDER — INSULIN ASPART 100 UNIT/ML ~~LOC~~ SOLN
0.0000 [IU] | Freq: Three times a day (TID) | SUBCUTANEOUS | Status: DC
  Administered 2019-03-15: 2 [IU] via SUBCUTANEOUS
  Administered 2019-03-15: 1 [IU] via SUBCUTANEOUS
  Administered 2019-03-15: 2 [IU] via SUBCUTANEOUS
  Administered 2019-03-16: 1 [IU] via SUBCUTANEOUS
  Filled 2019-03-15 (×4): qty 1

## 2019-03-15 MED ORDER — ONDANSETRON HCL 4 MG/2ML IJ SOLN: 4.0000 mg | Freq: Four times a day (QID) | INTRAMUSCULAR | Status: DC | PRN

## 2019-03-15 MED ORDER — FAMOTIDINE 20 MG PO TABS
20.0000 mg | ORAL_TABLET | Freq: Two times a day (BID) | ORAL | Status: DC
  Administered 2019-03-15 – 2019-03-16 (×3): 20 mg via ORAL
  Filled 2019-03-15 (×3): qty 1

## 2019-03-15 MED ORDER — INSULIN ASPART 100 UNIT/ML ~~LOC~~ SOLN: 0.0000 [IU] | Freq: Every day | SUBCUTANEOUS | Status: DC

## 2019-03-15 MED ORDER — ATORVASTATIN CALCIUM 80 MG PO TABS
80.0000 mg | ORAL_TABLET | Freq: Every day | ORAL | Status: DC
  Administered 2019-03-15: 80 mg via ORAL
  Filled 2019-03-15 (×2): qty 1

## 2019-03-15 MED ORDER — MORPHINE SULFATE (PF) 4 MG/ML IV SOLN: 4.0000 mg | Freq: Once | INTRAVENOUS | Status: DC

## 2019-03-15 MED ORDER — ASPIRIN 81 MG PO CHEW
81.0000 mg | CHEWABLE_TABLET | Freq: Every day | ORAL | Status: DC
  Administered 2019-03-15 – 2019-03-16 (×2): 81 mg via ORAL
  Filled 2019-03-15 (×2): qty 1

## 2019-03-15 MED ORDER — HEPARIN SODIUM (PORCINE) 5000 UNIT/ML IJ SOLN
5000.0000 [IU] | Freq: Three times a day (TID) | INTRAMUSCULAR | Status: DC
  Administered 2019-03-15 – 2019-03-16 (×4): 5000 [IU] via SUBCUTANEOUS
  Filled 2019-03-15 (×6): qty 1

## 2019-03-15 NOTE — ED Notes (Signed)
This RN gave pt medication per MD orders. Pt finished meal tray. Pt given water by Jinny Blossom, RN. Pt denies needing blanket and/or TV remote/TV turned on. Will continue to monitor.

## 2019-03-15 NOTE — Consult Note (Signed)
Reason for Consult: Angina coronary artery disease Referring Physician: [facility] hospitalist Cardiologist Clayborn Bigness  Justin Keith is an 73 y.o. male.  HPI: Patient's 73 year old black male history of known coronary disease coronary bypass surgery presents with anginal symptoms he has history of diabetes GERD hypertension hyperlipidemia obesity cardiomyopathy chronic renal insufficiency.  Patient complains of acute midsternal chest discomfort called rescue and was brought to the emergency room for evaluation enzymes of heartburn on remarkable but chest pain is improved but now consideration for further work-up  Past Medical History:  Diagnosis Date  . CAD (coronary artery disease)   . CKD (chronic kidney disease)   . Diabetes mellitus without complication ([place])   . GERD (gastroesophageal reflux disease)   . Hyperlipemia   . Hypertension     Past Surgical History:  Procedure Laterality Date  . CARDIAC CATHETERIZATION    . CARDIAC SURGERY    . COLONOSCOPY WITH PROPOFOL N/A 02/25/2018   Procedure: COLONOSCOPY WITH PROPOFOL;  Surgeon: Jonathon Bellows, MD;  Location: [facility] ENDOSCOPY;  Service: Gastroenterology;  Laterality: N/A;  . COLONOSCOPY WITH PROPOFOL N/A 02/26/2018   Procedure: COLONOSCOPY WITH PROPOFOL;  Surgeon: Jonathon Bellows, MD;  Location: [facility] ENDOSCOPY;  Service: Gastroenterology;  Laterality: N/A;  . ESOPHAGOGASTRODUODENOSCOPY (EGD) WITH PROPOFOL N/A 01/18/2019   Procedure: ESOPHAGOGASTRODUODENOSCOPY (EGD) WITH PROPOFOL;  Surgeon: Lucilla Lame, MD;  Location: [facility] ENDOSCOPY;  Service: Endoscopy;  Laterality: N/A;  . FOREIGN BODY REMOVAL N/A 03/07/2018   Procedure: FOREIGN BODY REMOVAL;  Surgeon: Jonathon Bellows, MD;  Location: [facility] ENDOSCOPY;  Service: Gastroenterology;  Laterality: N/A;  . LEFT HEART CATH AND CORS/GRAFTS ANGIOGRAPHY N/A 08/07/2017   Procedure: LEFT HEART CATH AND CORS/GRAFTS ANGIOGRAPHY;  Surgeon: Yolonda Kida, MD;  Location: [facility];  Service: Cardiovascular;  Laterality: N/A;  . PROSTATE SURGERY    . SHOULDER ARTHROSCOPY      No family history on file.  Social History:  reports that he has never smoked. His smokeless tobacco use includes chew. He reports previous drug use. He reports that he does not drink alcohol.  Allergies: No Known Allergies  Medications: I have reviewed the patient's current medications.  Results for orders placed or performed during the hospital encounter of 03/15/19 (from the past 48 hour(s))  CBC with Differential     Status: Abnormal   Collection Time: 03/15/19 12:49 AM  Result Value Ref Range   WBC 10.2 4.0 - 10.5 K/uL   RBC 3.54 (L) 4.22 - 5.81 MIL/uL   Hemoglobin 10.6 (L) 13.0 - 17.0 g/dL   HCT 32.3 (L) 39.0 - 52.0 %   MCV 91.2 80.0 - 100.0 fL   MCH 29.9 26.0 - 34.0 pg   MCHC 32.8 30.0 - 36.0 g/dL   RDW 13.9 11.5 - 15.5 %   Platelets 314 150 - 400 K/uL   nRBC 0.0 0.0 - 0.2 %   Neutrophils Relative % 62 %   Neutro Abs 6.4 1.7 - 7.7 K/uL   Lymphocytes Relative 25 %   Lymphs Abs 2.6 0.7 - 4.0 K/uL   Monocytes Relative 9 %   Monocytes Absolute 0.9 0.1 - 1.0 K/uL   Eosinophils Relative 3 %   Eosinophils Absolute 0.3 0.0 - 0.5 K/uL   Basophils Relative 1 %   Basophils Absolute 0.1 0.0 - 0.1 K/uL   Immature Granulocytes 0 %   Abs Immature Granulocytes 0.04 0.00 - 0.07 K/uL    Comment: Performed at [facility], [place] [address] [place], [place] [address]  Comprehensive metabolic panel     Status: Abnormal   Collection Time: 03/15/19 12:49 AM  Result Value Ref Range   Sodium 136 135 - 145 mmol/L   Potassium 4.4 3.5 - 5.1 mmol/L   Chloride 105 98 - 111 mmol/L   CO2 22 22 - 32 mmol/L   Glucose, Bld 227 (H) 70 - 99 mg/dL   BUN 23 8 - 23 mg/dL   Creatinine, Ser 1.39 (H) 0.61 - 1.24 mg/dL   Calcium 9.1 8.9 - 10.3 mg/dL   Total Protein 7.3 6.5 - 8.1 g/dL   Albumin 3.7 3.5 - 5.0 g/dL   AST 25 15 - 41 U/L   ALT 21 0 - 44 U/L   Alkaline Phosphatase 56  38 - 126 U/L   Total Bilirubin 0.4 0.3 - 1.2 mg/dL   GFR calc non Af Amer 50 (L) >60 mL/min   GFR calc Af Amer 58 (L) >60 mL/min   Anion gap 9 5 - 15    Comment: Performed at [facility], [address] [place], [place] [address]  Lipase, blood     Status: None   Collection Time: 03/15/19 12:49 AM  Result Value Ref Range   Lipase 29 11 - 51 U/L    Comment: Performed at [facility], [address], [place], [place] [address]  Troponin I (High Sensitivity)     Status: None   Collection Time: 03/15/19 12:49 AM  Result Value Ref Range   Troponin I (High Sensitivity) 13 <18 ng/L    Comment: (NOTE) Elevated high sensitivity troponin I (hsTnI) values and significant  changes across serial measurements may suggest ACS but many other  chronic and acute conditions are known to elevate hsTnI results.  Refer to the "Links" section for chest pain algorithms and additional  guidance. Performed at [facility], [address] [place],  [address]   Hemoglobin A1c     Status: Abnormal   Collection Time: 03/15/19 12:49 AM  Result Value Ref Range   Hgb A1c MFr Bld 7.7 (H) 4.8 - 5.6 %    Comment: (NOTE) Pre diabetes:          5.7%-6.4% Diabetes:              >6.4% Glycemic control for   <7.0% adults with diabetes    Mean Plasma Glucose 174.29 mg/dL    Comment: Performed at [facility] [address], [place], [place] [address]  SARS CORONAVIRUS 2 (TAT 6-24 HRS) Nasopharyngeal Nasopharyngeal Swab     Status: None   Collection Time: 03/15/19  2:23 AM   Specimen: Nasopharyngeal Swab  Result Value Ref Range   SARS Coronavirus 2 NEGATIVE NEGATIVE    Comment: (NOTE) SARS-CoV-2 target nucleic acids are NOT DETECTED. The SARS-CoV-2 RNA is generally detectable in upper and lower respiratory specimens during the acute phase of infection. Negative results do not preclude SARS-CoV-2 infection, do not rule out co-infections with other pathogens, and  should not be used as the sole basis for treatment or other patient management decisions. Negative results must be combined with clinical observations, patient history, and epidemiological information. The expected result is Negative. Fact Sheet for Patients: SugarRoll.be Fact Sheet for Healthcare Providers: https://www.woods-mathews.com/ This test is not yet approved or cleared by the [place] FDA and  has been authorized for detection and/or diagnosis of SARS-CoV-2 by FDA under an Emergency Use Authorization (EUA). This EUA will remain  in effect (meaning this test can be used) for the duration of the COVID-19 declaration under Section 56 4(b)(1) of the Act, 21 U.S.C. section 360bbb-3(b)(1), unless the authorization is terminated or revoked sooner. Performed at [facility], [place] [address], [place], [place] [address]   Troponin I (High Sensitivity)     Status: None   Collection Time: 03/15/19  2:23 AM  Result Value Ref Range   Troponin I (High Sensitivity) 14 <18 ng/L    Comment: (NOTE) Elevated high sensitivity troponin I (hsTnI) values and significant  changes across serial measurements may suggest ACS but many other  chronic and acute conditions are known to elevate hsTnI results.  Refer to the "Links" section for chest pain algorithms and additional  guidance. Performed at [facility], [address] [place], [place] [address]   Lipid panel     Status: Abnormal   Collection Time: 03/15/19  7:28 AM  Result Value Ref Range   Cholesterol 170 0 - 200 mg/dL   Triglycerides 115 <150 mg/dL   HDL 44 >40 mg/dL   Total CHOL/HDL Ratio 3.9 RATIO   VLDL 23 0 - 40 mg/dL   LDL Cholesterol 103 (H) 0 - 99 mg/dL    Comment:        Total Cholesterol/HDL:CHD Risk Coronary Heart Disease Risk Table                     Men   Women  1/2 Average Risk   3.4   3.3  Average Risk       5.0   4.4  2 X Average Risk   9.6   7.1  3  X Average Risk  23.4   11.0        Use the calculated Patient Ratio above and the CHD Risk Table to determine the patient's CHD Risk.        ATP III CLASSIFICATION (LDL):  <100     mg/dL   Optimal  100-129  mg/dL   Near or Above                    Optimal  130-159  mg/dL   Borderline  160-189  mg/dL   High  >190     mg/dL   Very High Performed at [facility], [address] [place], [place] [id number]   Basic metabolic panel     Status: Abnormal   Collection Time: 03/15/19  7:28 AM  Result Value Ref Range   Sodium 139 135 - 145 mmol/L   Potassium 4.3 3.5 - 5.1 mmol/L   Chloride 105 98 - 111 mmol/L   CO2 23 22 - 32 mmol/L   Glucose, Bld 139 (H) 70 - 99 mg/dL   BUN 22 8 - 23 mg/dL   Creatinine, Ser 1.41 (H) 0.61 - 1.24 mg/dL   Calcium 9.4 8.9 - 10.3 mg/dL   GFR calc non Af Amer 49 (L) >60 mL/min   GFR calc Af Amer 57 (L) >60 mL/min   Anion gap 11 5 - 15    Comment: Performed at [facility], [address] [place], [place] [address]  Glucose, capillary     Status: Abnormal   Collection Time: 03/15/19  8:48 AM  Result Value Ref Range   Glucose-Capillary 124 (H) 70 - 99 mg/dL  Glucose, capillary     Status: Abnormal   Collection Time: 03/15/19 11:43 AM  Result Value Ref Range  Glucose-Capillary 154 (H) 70 - 99 mg/dL    Dg Chest Portable 1 View  Result Date: 03/15/2019 CLINICAL DATA:  Left-sided chest pain, diaphoresis EXAM: PORTABLE CHEST 1 VIEW COMPARISON:  Radiograph Oct 22, 2018 FINDINGS: Postsurgical changes related to prior CABG including intact and aligned sternotomy wires and multiple surgical clips projecting over the mediastinum. Stable cardiomegaly. No consolidation, features of edema, pneumothorax, or effusion. Degenerative changes are present in the imaged spine and shoulders. No acute osseous or soft tissue abnormality IMPRESSION: Postsurgical changes related to prior CABG. No acute cardiopulmonary disease. Electronically Signed   By: Lovena Le M.D.   On: 03/15/2019 01:46    Review of Systems  Constitutional: Positive for malaise/fatigue.  HENT: Positive for congestion.   Eyes: Negative.   Respiratory: Positive for shortness of breath.   Cardiovascular: Positive for chest pain and leg swelling.  Gastrointestinal: Negative.   Genitourinary: Negative.   Musculoskeletal: Negative.   Skin: Negative.   Neurological: Positive for dizziness, weakness and headaches.  Endo/Heme/Allergies: Negative.   Psychiatric/Behavioral: Negative.    Blood pressure 137/70, pulse 60, temperature 98.6 F (37 C), resp. rate 18, height 5\' 8"  (1.727 m), weight 110.7 kg, SpO2 98 %. Physical Exam  Nursing note and vitals reviewed. Constitutional: He is oriented to person, place, and time. He appears well-developed and well-nourished.  HENT:  Head: Normocephalic and atraumatic.  Eyes: Pupils are equal, round, and reactive to light. Conjunctivae and EOM are normal.  Neck: Normal range of motion. Neck supple.  Cardiovascular: Normal rate and regular rhythm.  Murmur heard. Respiratory: Effort normal and breath sounds normal.  GI: Soft. Bowel sounds are normal.  Musculoskeletal:        General: Edema present.  Neurological: He is alert and oriented to person, place, and time. He has normal reflexes.  Skin: Skin is warm and dry.  Psychiatric: He has a normal mood and affect.    Assessment/Plan: Chest pain Unstable angina Coronary artery disease History of coronary bypass surgery Chronic renal insufficiency Smoking GERD Obesity Diabetes type 2 . Plan Agree with admission rule out for myocardial infarction Follow-up cardiac enzymes and EKG Echocardiogram was helpful for assessment of ventricular function or shortness of breath Recommend weight loss exercise portion control Continue reflux medications Continue diabetes management and control Mild cardiomyopathy ejection fraction around 40 to 45% Advised patient refrain from  smoking Consider functional study versus cardiac cath  Dwayne D Callwood 03/15/2019, 1:47 PM

## 2019-03-15 NOTE — ED Triage Notes (Addendum)
Pt to ED via EMS from home. Pt states he was sitting in recliner around 10pm became diaphoretic and had left sided chest pain. Denies radiation. Pt has hx of stents placed and bypass. Pt states he has had some SOB as well. NAD. VSS. Pt given 324 Asprin and 1 nitro by ems

## 2019-03-15 NOTE — Progress Notes (Signed)
[place] [facility] [place] NAME: Lemonte Kult    MR#:  [id number]  DATE OF BIRTH:  [birth year]  SUBJECTIVE:  CHIEF COMPLAINT:   Chief Complaint  Patient presents with  . Chest Pain    REVIEW OF SYSTEMS:  Review of Systems  Constitutional: Negative for chills and fever.  HENT: Negative for hearing loss and tinnitus.   Eyes: Negative for blurred vision and double vision.  Respiratory: Negative for cough, hemoptysis and shortness of breath.   Cardiovascular: Negative for palpitations.       Chest pain resolved this morning  Gastrointestinal: Negative for heartburn and nausea.  Genitourinary: Negative for dysuria and urgency.  Musculoskeletal: Negative for myalgias and neck pain.  Skin: Negative for itching and rash.  Neurological: Negative for dizziness and headaches.  Psychiatric/Behavioral: Negative for depression and suicidal ideas.    DRUG ALLERGIES:  No Known Allergies VITALS:  Blood pressure (!) 152/75, pulse (!) 54, temperature 98.2 F (36.8 C), temperature source Oral, resp. rate 19, height 5\' 8"  (1.727 m), weight 110.7 kg, SpO2 100 %. PHYSICAL EXAMINATION:  Physical Exam  GENERAL:  73 y.o.-year-old patient lying in the bed with no acute distress.  EYES: Pupils equal, round, reactive to light and accommodation. No scleral icterus. Extraocular muscles intact.  HEENT: Head atraumatic, normocephalic. Oropharynx and nasopharynx clear.  NECK: Supple, no jugular venous distention. No thyroid enlargement, no tenderness.  LUNGS: Normal breath sounds bilaterally, no wheezing, rales,rhonchi or crepitation. No use of accessory muscles of respiration.  CARDIOVASCULAR: S1, S2 normal. No murmurs, rubs, or gallops.  ABDOMEN: Soft, nontender, nondistended. Bowel sounds present. No organomegaly or mass.  EXTREMITIES: No pedal edema, cyanosis, or clubbing.  NEUROLOGIC: Cranial nerves II through XII are intact. Muscle strength 5/5 in all  extremities. Sensation intact. Gait not checked.  PSYCHIATRIC: The patient is alert and oriented x 3.  SKIN: No obvious rash, lesion, or ulcer.   LABORATORY PANEL:  Male CBC Recent Labs  Lab 03/15/19 0049  WBC 10.2  HGB 10.6*  HCT 32.3*  PLT 314   ------------------------------------------------------------------------------------------------------------------ Chemistries  Recent Labs  Lab 03/15/19 0049 03/15/19 0728  NA 136 139  K 4.4 4.3  CL 105 105  CO2 22 23  GLUCOSE 227* 139*  BUN 23 22  CREATININE 1.39* 1.41*  CALCIUM 9.1 9.4  AST 25  --   ALT 21  --   ALKPHOS 56  --   BILITOT 0.4  --    RADIOLOGY:  Dg Chest Portable 1 View  Result Date: 03/15/2019 CLINICAL DATA:  Left-sided chest pain, diaphoresis EXAM: PORTABLE CHEST 1 VIEW COMPARISON:  Radiograph Oct 22, 2018 FINDINGS: Postsurgical changes related to prior CABG including intact and aligned sternotomy wires and multiple surgical clips projecting over the mediastinum. Stable cardiomegaly. No consolidation, features of edema, pneumothorax, or effusion. Degenerative changes are present in the imaged spine and shoulders. No acute osseous or soft tissue abnormality IMPRESSION: Postsurgical changes related to prior CABG. No acute cardiopulmonary disease. Electronically Signed   By: Lovena Le M.D.   On: 03/15/2019 01:46   ASSESSMENT AND PLAN:   73 y.o. male with pertinent past medical history of diabetes mellitus, GERD, hyperlipidemia, CAD s/p CABG, cardiomyopathy, CKD and smoking presenting to the ED with chief complaints of chest pain.  1. Atypical chest pain -no dynamic EKG changes or cardiac enzyme abnormality - Admit to telemetry unit - ASA 81mg  PO daily (initial 162-324mg  = 2-4x 81mg  chewable tablets) - Atorvastatin  80mg  PO daily.  Lipid panel done with LDL of 103.  - NTG + morphine PRN chest pain if ongoing chest pain  - Recent echo 6/20 EF 45% Acute coronary syndrome ruled out. Patient seen by  cardiologist already.  Advised to refrain from smoking.  Cardiologist wishes to consider functional study versus cardiac catheterization. Patient currently chest pain-free  2. Coronary Artery Disease  S/P CABG - ASA 81mg  PO daily - HTN, HLD, DM control as below  3. HLD  Lipid panel done with LDL of 103. - Atorvastatin 80mg  PO qhs  4. HTN = + Goal BP <130/80 -Continue lisinopril and amlodipine creatinine at baseline  5. Diabetes mellitus - Hold metformin - SSI  6. GERD -Protonix  7. DVT prophylaxis - Heparin SubQ      All the records are reviewed and case discussed with Care Management/Social Worker. Management plans discussed with the patient, family and they are in agreement.  CODE STATUS: Full Code  TOTAL TIME TAKING CARE OF THIS PATIENT: 31 minutes.   More than 50% of the time was spent in counseling/coordination of care: YES  POSSIBLE D/C IN 1-2 DAYS, DEPENDING ON CLINICAL CONDITION.   Milanni Ayub M.D on 03/15/2019 at 3:48 PM  Between 7am to 6pm - Pager - [phone]  After 6pm go to [email or web address] - Technical brewer [place] [facility]  Office  [phone]  CC: Primary care physician; Center, [place]  Note: This dictation was prepared with Diplomatic Services operational officer dictation along with smaller phrase technology. Any transcriptional errors that result from this process are unintentional.

## 2019-03-15 NOTE — H&P (Signed)
[place] [facility] [place] NAME: Justin Keith    MR#:  [id number]  DATE OF BIRTH:  18-May-1946  DATE OF ADMISSION:  03/15/2019  PRIMARY CARE PHYSICIAN: [facility] [place]   REQUESTING/REFERRING PHYSICIAN: Marjean Donna MD  CHIEF COMPLAINT:   Chief Complaint  Patient presents with  . Chest Pain    HISTORY OF PRESENT ILLNESS:  73 y.o. male with pertinent past medical history of diabetes mellitus, GERD, hyperlipidemia, CAD s/p stent, heart murmur, cardiomyopathy, CKD and smoking presenting to the ED with chief complaints of chest pain.  Patient report that he was sitting in a recliner at around 10 PM last night when he suddenly developed left sided chest pain associated with diaphoresis. Denies radiation, shortness of breath, abdominal pain, diarrhea, nausea vomiting, dizziness or near syncopal episode. Given his history of MIs patient called EMS and on arrival he was given 325 of aspirin and 1 nitro by EMS and transported to the ED for further evaluation.  On arrival to the ED, he was afebrile with blood pressure 153/68 mm Hg and pulse rate 73 beats/min. There were no focal neurological deficits; he was alert and oriented x4, and chest pain free.  Initial labs revealed unremarkable CBC, glucose 227, creatinine 1.39 at baseline, initial troponin 13 and repeat 14.  EKG shows normal sinus rate of 65, no ST elevation, or abnormality noticed in aVL and V2.  Given patient's history of CAD with MI hospitalist asked to admit for further evaluation.  PAST MEDICAL HISTORY:   Past Medical History:  Diagnosis Date  . CAD (coronary artery disease)   . CKD (chronic kidney disease)   . Diabetes mellitus without complication ([place])   . GERD (gastroesophageal reflux disease)   . Hyperlipemia   . Hypertension     PAST SURGICAL HISTORY:   Past Surgical History:  Procedure Laterality Date  . CARDIAC CATHETERIZATION    . CARDIAC SURGERY     . COLONOSCOPY WITH PROPOFOL N/A 02/25/2018   Procedure: COLONOSCOPY WITH PROPOFOL;  Surgeon: Jonathon Bellows, MD;  Location: [facility] ENDOSCOPY;  Service: Gastroenterology;  Laterality: N/A;  . COLONOSCOPY WITH PROPOFOL N/A 02/26/2018   Procedure: COLONOSCOPY WITH PROPOFOL;  Surgeon: Jonathon Bellows, MD;  Location: [facility] ENDOSCOPY;  Service: Gastroenterology;  Laterality: N/A;  . ESOPHAGOGASTRODUODENOSCOPY (EGD) WITH PROPOFOL N/A 01/18/2019   Procedure: ESOPHAGOGASTRODUODENOSCOPY (EGD) WITH PROPOFOL;  Surgeon: Lucilla Lame, MD;  Location: [facility] ENDOSCOPY;  Service: Endoscopy;  Laterality: N/A;  . FOREIGN BODY REMOVAL N/A 03/07/2018   Procedure: FOREIGN BODY REMOVAL;  Surgeon: Jonathon Bellows, MD;  Location: [facility] ENDOSCOPY;  Service: Gastroenterology;  Laterality: N/A;  . LEFT HEART CATH AND CORS/GRAFTS ANGIOGRAPHY N/A 08/07/2017   Procedure: LEFT HEART CATH AND CORS/GRAFTS ANGIOGRAPHY;  Surgeon: Yolonda Kida, MD;  Location: [facility] CV LAB;  Service: Cardiovascular;  Laterality: N/A;  . PROSTATE SURGERY    . SHOULDER ARTHROSCOPY      SOCIAL HISTORY:   Social History   Tobacco Use  . Smoking status: Never Smoker  . Smokeless tobacco: Current User    Types: Chew  Substance Use Topics  . Alcohol use: No    FAMILY HISTORY:  No family history on file.  DRUG ALLERGIES:  No Known Allergies  REVIEW OF SYSTEMS:   ROS  MEDICATIONS AT HOME:   Prior to Admission medications   Medication Sig Start Date End Date Taking? Authorizing Provider  amLODipine (NORVASC) 10 MG tablet Take 1 tablet (10 mg total)  by mouth daily. 12/08/15  Yes Vaughan Basta, MD  aspirin 81 MG chewable tablet Chew 1 tablet (81 mg total) by mouth daily. 12/08/15  Yes Vaughan Basta, MD  atorvastatin (LIPITOR) 80 MG tablet Take 1 tablet by mouth daily.   Yes [provider]  lisinopril (PRINIVIL,ZESTRIL) 10 MG tablet Take 1 tablet (10 mg total) by mouth daily. 12/08/15  Yes Vaughan Basta, MD   metFORMIN (GLUCOPHAGE-XR) 500 MG 24 hr tablet Take 1,000 mg by mouth daily with breakfast.    Yes [provider]  nitroGLYCERIN (NITROSTAT) 0.4 MG SL tablet Place 0.4 mg under the tongue every 5 (five) minutes as needed for chest pain.   Yes [provider]  pantoprazole (PROTONIX) 40 MG tablet Take 1 tablet (40 mg total) by mouth daily. 01/18/19 01/18/20 Yes Lucilla Lame, MD  pantoprazole (PROTONIX) 40 MG tablet Take 40 mg by mouth daily.    [provider]  ranitidine (ZANTAC) 150 MG tablet Take 150 mg by mouth 2 (two) times daily.     [provider]      VITAL SIGNS:  Blood pressure 134/72, pulse (!) 59, temperature 98.6 F (37 C), resp. rate 15, height 5\' 8"  (1.727 m), weight 110.7 kg, SpO2 94 %.  PHYSICAL EXAMINATION:   Physical Exam  GENERAL:  73 y.o.-year-old patient lying in the bed with no acute distress.  EYES: Pupils equal, round, reactive to light and accommodation. No scleral icterus. Extraocular muscles intact.  HEENT: Head atraumatic, normocephalic. Oropharynx and nasopharynx clear.  NECK:  Supple, no jugular venous distention. No thyroid enlargement, no tenderness.  LUNGS: Normal breath sounds bilaterally, no wheezing, rales,rhonchi or crepitation. No use of accessory muscles of respiration.  CARDIOVASCULAR: S1, S2 normal. No murmurs, rubs, or gallops.  ABDOMEN: Soft, nontender, nondistended. Bowel sounds present. No organomegaly or mass.  EXTREMITIES: No pedal edema, cyanosis, or clubbing.  NEUROLOGIC: Cranial nerves II through XII are intact. Muscle strength 5/5 in all extremities. Sensation intact. Gait not checked.  PSYCHIATRIC: The patient is alert and oriented x 3.  SKIN: No obvious rash, lesion, or ulcer.   DATA REVIEWED:  LABORATORY PANEL:   CBC Recent Labs  Lab 03/15/19 0049  WBC 10.2  HGB 10.6*  HCT 32.3*  PLT 314    ------------------------------------------------------------------------------------------------------------------  Chemistries  Recent Labs  Lab 03/15/19 0049  NA 136  K 4.4  CL 105  CO2 22  GLUCOSE 227*  BUN 23  CREATININE 1.39*  CALCIUM 9.1  AST 25  ALT 21  ALKPHOS 56  BILITOT 0.4   ------------------------------------------------------------------------------------------------------------------  Cardiac Enzymes No results for input(s): TROPONINI in the last 168 hours. ------------------------------------------------------------------------------------------------------------------  RADIOLOGY:  Dg Chest Portable 1 View  Result Date: 03/15/2019 CLINICAL DATA:  Left-sided chest pain, diaphoresis EXAM: PORTABLE CHEST 1 VIEW COMPARISON:  Radiograph Oct 22, 2018 FINDINGS: Postsurgical changes related to prior CABG including intact and aligned sternotomy wires and multiple surgical clips projecting over the mediastinum. Stable cardiomegaly. No consolidation, features of edema, pneumothorax, or effusion. Degenerative changes are present in the imaged spine and shoulders. No acute osseous or soft tissue abnormality IMPRESSION: Postsurgical changes related to prior CABG. No acute cardiopulmonary disease. Electronically Signed   By: Lovena Le M.D.   On: 03/15/2019 01:46    EKG:  EKG: normal EKG, normal sinus rhythm, unchanged from previous tracings.  IMPRESSION AND PLAN:   73 y.o. male with pertinent past medical history of diabetes mellitus, GERD, hyperlipidemia, CAD s/p stent, heart murmur, cardiomyopathy, CKD and smoking  presenting to the ED with chief complaints of chest pain.  1. Atypical chest pain -no dynamic EKG changes or cardiac enzyme abnormality - Admit to telemetry unit - ASA 81mg  PO daily (initial 162-324mg  = 2-4x 81mg  chewable tablets) - Atorvastatin 80mg  PO daily, check lipid panel  - NTG + morphine PRN chest pain if ongoing chest pain  - Recent echo 6/20  EF 45% -Cardiology consult patient follows with Dr. Clayborn Bigness  2. Coronary Artery Disease  + PCI s/p Stent - ASA 81mg  PO daily - HTN, HLD, DM control as below  3. HLD  + Goal LDL<100 - Atorvastatin 80mg  PO qhs  4. HTN = + Goal BP <130/80 -Continue lisinopril and amlodipine creatinine at baseline  5. Diabetes mellitus - Hold metformin - SSI  6. GERD -Protonix  7. DVT prophylaxis - Heparin SubQ   All the records are reviewed and case discussed with ED provider. Management plans discussed with the patient, family and they are in agreement.  CODE STATUS: FULL  TOTAL TIME TAKING CARE OF THIS PATIENT: 50 minutes.    on 03/15/2019 at [place], DNP, FNP-BC [facility] Nurse Practitioner Between 7am to 6pm - Pager [phone]  After 6pm go to [email or web address] - password [email or web address] [place] [facility]  Office  [phone]  CC: Primary care physician; [facility] [place]

## 2019-03-15 NOTE — ED Notes (Signed)
Floor made aware that pt was on the way; number given for questions

## 2019-03-15 NOTE — ED Notes (Signed)
Report to daniel rn

## 2019-03-15 NOTE — ED Notes (Signed)
Pt's CBG 124, meal tray given to patient at this time. Hand hygeine offered to patient prior to him starting his breakfast tray.

## 2019-03-15 NOTE — ED Notes (Signed)
This RN introduced self to pt. This RN assisted pt to the bathroom. Pt states he did get SOB but it resolved once back in bed. Labs drawn and sent. This RN told pt once meal tray arrives we will get his CBG and meds. Pt advised we are still waiting on a bed. Pt denies any further needs at this time. Will continue to monitor.

## 2019-03-15 NOTE — ED Provider Notes (Signed)
[facility] Emergency Department Provider Note  ____________________________________________   First MD Initiated Contact with Patient 03/15/19 0038     (approximate)  I have reviewed the triage vital signs and the nursing notes.   HISTORY  Chief Complaint Chest Pain    HPI Justin Keith is a 73 y.o. male with stent placements and CABG in 2005 who presents with chest pain.  Patient had chest pain that started at 10 PM.  The pain is a pressure sensation on the left side of his chest, nonradiating, came on at rest, nothing makes it better including before aspirin and nitro, nothing makes it worse.  Denies any new shortness of breath no cough.  Per EMS had some low-grade temps of 99.6 but denies feeling feverish.  Patient is having a chest pain came on that he did feel little diaphoretic.  Denies history of blood clots.  No leg swelling.          Past Medical History:  Diagnosis Date  . CAD (coronary artery disease)   . CKD (chronic kidney disease)   . Diabetes mellitus without complication ([place])   . GERD (gastroesophageal reflux disease)   . Hyperlipemia   . Hypertension     Patient Active Problem List   Diagnosis Date Noted  . Problems with swallowing and mastication   . Chest pain in adult 10/22/2018  . HTN (hypertension) 09/15/2018  . HLD (hyperlipidemia) 09/15/2018  . GERD (gastroesophageal reflux disease) 09/15/2018  . CAD (coronary artery disease) 09/15/2018  . Diabetes ([place]) 09/15/2018  . Chest pain 12/07/2015    Past Surgical History:  Procedure Laterality Date  . CARDIAC CATHETERIZATION    . CARDIAC SURGERY    . COLONOSCOPY WITH PROPOFOL N/A 02/25/2018   Procedure: COLONOSCOPY WITH PROPOFOL;  Surgeon: Jonathon Bellows, MD;  Location: [facility] ENDOSCOPY;  Service: Gastroenterology;  Laterality: N/A;  . COLONOSCOPY WITH PROPOFOL N/A 02/26/2018   Procedure: COLONOSCOPY WITH PROPOFOL;  Surgeon: Jonathon Bellows, MD;  Location: [facility] ENDOSCOPY;   Service: Gastroenterology;  Laterality: N/A;  . ESOPHAGOGASTRODUODENOSCOPY (EGD) WITH PROPOFOL N/A 01/18/2019   Procedure: ESOPHAGOGASTRODUODENOSCOPY (EGD) WITH PROPOFOL;  Surgeon: Lucilla Lame, MD;  Location: [facility] ENDOSCOPY;  Service: Endoscopy;  Laterality: N/A;  . FOREIGN BODY REMOVAL N/A 03/07/2018   Procedure: FOREIGN BODY REMOVAL;  Surgeon: Jonathon Bellows, MD;  Location: [facility] ENDOSCOPY;  Service: Gastroenterology;  Laterality: N/A;  . LEFT HEART CATH AND CORS/GRAFTS ANGIOGRAPHY N/A 08/07/2017   Procedure: LEFT HEART CATH AND CORS/GRAFTS ANGIOGRAPHY;  Surgeon: Yolonda Kida, MD;  Location: [place] CV LAB;  Service: Cardiovascular;  Laterality: N/A;  . PROSTATE SURGERY    . SHOULDER ARTHROSCOPY      Prior to Admission medications   Medication Sig Start Date End Date Taking? Authorizing Provider  amLODipine (NORVASC) 10 MG tablet Take 1 tablet (10 mg total) by mouth daily. 12/08/15   Vaughan Basta, MD  aspirin 81 MG chewable tablet Chew 1 tablet (81 mg total) by mouth daily. 12/08/15   Vaughan Basta, MD  atorvastatin (LIPITOR) 80 MG tablet Take 1 tablet by mouth daily.    [provider]  lisinopril (PRINIVIL,ZESTRIL) 10 MG tablet Take 1 tablet (10 mg total) by mouth daily. 12/08/15   Vaughan Basta, MD  metFORMIN (GLUCOPHAGE-XR) 500 MG 24 hr tablet Take 500 mg by mouth 2 (two) times daily.     [provider]  nitroGLYCERIN (NITROSTAT) 0.4 MG SL tablet Place 0.4 mg under the tongue every 5 (five) minutes as needed  for chest pain.    [provider]  pantoprazole (PROTONIX) 40 MG tablet Take 1 tablet (40 mg total) by mouth daily. 01/18/19 01/18/20  Lucilla Lame, MD  pantoprazole (PROTONIX) 40 MG tablet Take 40 mg by mouth daily.    [provider]  ranitidine (ZANTAC) 150 MG tablet Take 150 mg by mouth 2 (two) times daily.     [provider]    Allergies Patient has no known allergies.  No family history on  file.  Social History Social History   Tobacco Use  . Smoking status: Never Smoker  . Smokeless tobacco: Current User    Types: Chew  Substance Use Topics  . Alcohol use: No  . Drug use: Not Currently      Review of Systems Constitutional: No fever/chills Eyes: No visual changes. ENT: No sore throat. Cardiovascular: Positive chest pain Respiratory: Denies shortness of breath. Gastrointestinal: No abdominal pain.  No nausea, no vomiting.  No diarrhea.  No constipation. Genitourinary: Negative for dysuria. Musculoskeletal: Negative for back pain. Skin: Negative for rash. Neurological: Negative for headaches, focal weakness or numbness. All other ROS negative ____________________________________________   PHYSICAL EXAM:  VITAL SIGNS: Blood pressure 117/65, pulse 61, temperature 98.6 F (37 C), resp. rate 17, height 5\' 8"  (1.727 m), weight 110.7 kg, SpO2 96 %.  Constitutional: Alert and oriented. Well appearing and in no acute distress. Eyes: Conjunctivae are normal. EOMI. Head: Atraumatic. Nose: No congestion/rhinnorhea. Mouth/Throat: Mucous membranes are moist.   Neck: No stridor. Trachea Midline. FROM Cardiovascular: Normal rate, regular rhythm. Grossly normal heart sounds.  Good peripheral circulation.  Well-healing midsternal scar. Equal pulses in hands and feet  Respiratory: Normal respiratory effort.  No retractions. Lungs CTAB. Gastrointestinal: Soft and nontender. No distention. No abdominal bruits.  Musculoskeletal: No lower extremity tenderness nor edema.  No joint effusions. Neurologic:  Normal speech and language. No gross focal neurologic deficits are appreciated.  Skin:  Skin is warm, dry and intact. No rash noted. Psychiatric: Mood and affect are normal. Speech and behavior are normal. GU: Deferred   ____________________________________________   LABS (all labs ordered are listed, but only abnormal results are displayed)  Labs Reviewed  CBC WITH  DIFFERENTIAL/PLATELET - Abnormal; Notable for the following components:      Result Value   RBC 3.54 (*)    Hemoglobin 10.6 (*)    HCT 32.3 (*)    All other components within normal limits  COMPREHENSIVE METABOLIC PANEL - Abnormal; Notable for the following components:   Glucose, Bld 227 (*)    Creatinine, Ser 1.39 (*)    GFR calc non Af Amer 50 (*)    GFR calc Af Amer 58 (*)    All other components within normal limits  LIPASE, BLOOD  TROPONIN I (HIGH SENSITIVITY)   ____________________________________________   ED ECG REPORT I, Vanessa [place], the attending physician, personally viewed and interpreted this ECG.  Normal sinus rate of 65, no st elevation, twi in AVL and V2, normal intervals.  ____________________________________________  RADIOLOGY Robert Bellow, personally viewed and evaluated these images (plain radiographs) as part of my medical decision making, as well as reviewing the written report by the radiologist.  ED MD interpretation: no PNA   Official radiology report(s): Dg Chest Portable 1 View  Result Date: 03/15/2019 CLINICAL DATA:  Left-sided chest pain, diaphoresis EXAM: PORTABLE CHEST 1 VIEW COMPARISON:  Radiograph Oct 22, 2018 FINDINGS: Postsurgical changes related to prior CABG including intact and aligned sternotomy wires  and multiple surgical clips projecting over the mediastinum. Stable cardiomegaly. No consolidation, features of edema, pneumothorax, or effusion. Degenerative changes are present in the imaged spine and shoulders. No acute osseous or soft tissue abnormality IMPRESSION: Postsurgical changes related to prior CABG. No acute cardiopulmonary disease. Electronically Signed   By: Lovena Le M.D.   On: 03/15/2019 01:46    ____________________________________________   PROCEDURES  Procedure(s) performed (including Critical Care):  Procedures   ____________________________________________   INITIAL IMPRESSION / ASSESSMENT AND PLAN / ED  COURSE   Justin Keith was evaluated in Emergency Department on 03/15/2019 for the symptoms described in the history of present illness. He was evaluated in the context of the global COVID-19 pandemic, which necessitated consideration that the patient might be at risk for infection with the SARS-CoV-2 virus that causes COVID-19. Institutional protocols and algorithms that pertain to the evaluation of patients at risk for COVID-19 are in a state of rapid change based on information released by regulatory bodies including the CDC and federal and state organizations. These policies and algorithms were followed during the patient's care in the ED.    Most Likely DDx:  -Will get cardiac markers to evaluate for ACS given risk factors/age   DDx that was also considered d/t potential to cause harm, but was found less likely based on history and physical (as detailed above): -PNA (no fevers, cough but CXR to evaluate) -PNX (reassured with equal b/l breath sounds, CXR to evaluate) -Symptomatic anemia (will get H&H) -Pulmonary embolism as no sob at rest, not pleuritic in nature, no hypoxia -Aortic Dissection as no tearing pain and no radiation to the mid back, pulses equal -Pericarditis no rub on exam, EKG changes or hx to suggest dx -Tamponade (no notable SOB, tachycardic, hypotensive) -Esophageal rupture (no h/o diffuse vomitting/no crepitus)  Re-eval no chest pain.   hgb at baseline  Cr at baseline.  Trop 13.  Given high heart score will d/w medicine for chest pain workup.      ____________________________________________   FINAL CLINICAL IMPRESSION(S) / ED DIAGNOSES   Final diagnoses:  Chest pain, unspecified type     MEDICATIONS GIVEN DURING THIS VISIT:  Medications - No data to display   ED Discharge Orders    None       Note:  This document was prepared using Dragon voice recognition software and may include unintentional dictation errors.   Vanessa [place],  MD 03/15/19 [phone]

## 2019-03-15 NOTE — ED Notes (Signed)
Gave pt lunch tray with juice. 

## 2019-03-15 NOTE — ED Notes (Addendum)
Attempted to call report to floor, unsuccessful at this time

## 2019-03-16 LAB — BASIC METABOLIC PANEL
Anion gap: 12 (ref 5–15)
BUN: 23 mg/dL (ref 8–23)
CO2: 21 mmol/L — ABNORMAL LOW (ref 22–32)
Calcium: 9.5 mg/dL (ref 8.9–10.3)
Chloride: 105 mmol/L (ref 98–111)
Creatinine, Ser: 1.5 mg/dL — ABNORMAL HIGH (ref 0.61–1.24)
GFR calc Af Amer: 53 mL/min — ABNORMAL LOW (ref >60)
GFR calc non Af Amer: 46 mL/min — ABNORMAL LOW (ref >60)
Glucose, Bld: 141 mg/dL — ABNORMAL HIGH (ref 70–99)
Potassium: 4.8 mmol/L (ref 3.5–5.1)
Sodium: 138 mmol/L (ref 135–145)

## 2019-03-16 LAB — GLUCOSE, CAPILLARY: Glucose-Capillary: 126 mg/dL — ABNORMAL HIGH (ref 70–99)

## 2019-03-16 LAB — MAGNESIUM: Magnesium: 2.4 mg/dL (ref 1.7–2.4)

## 2019-03-16 NOTE — Plan of Care (Signed)
  Problem: Clinical Measurements: Goal: Cardiovascular complication will be avoided Outcome: Progressing   

## 2019-03-16 NOTE — Care Management Obs Status (Signed)
[facility] NOTIFICATION   Patient Details  Name: TRESTIN MORELAN MRN: [id number] Date of Birth: [birth year]   Medicare Observation Status Notification Given:  Yes    Elza Rafter, RN 03/16/2019, 10:57 AM

## 2019-03-16 NOTE — Care Management CC44 (Signed)
Condition Code 44 Documentation Completed  Patient Details  Name: Justin Keith MRN: [id number] Date of Birth: 10-04-1945   Condition Code 44 given:  Yes Patient signature on Condition Code 44 notice:  Yes Documentation of 2 MD's agreement:  Yes Code 44 added to claim:  Yes    Elza Rafter, RN 03/16/2019, 10:57 AM

## 2019-03-16 NOTE — Discharge Summary (Signed)
[facility] NAME: Justin Keith    MR#:  [id number]  DATE OF BIRTH:  [birth year]  DATE OF ADMISSION:  03/15/2019 ADMITTING PHYSICIAN: Lang Snow, NP  DATE OF DISCHARGE: 03/16/2019 11:50 AM  PRIMARY CARE PHYSICIAN: [facility] [place]    ADMISSION DIAGNOSIS:  Chest pain, unspecified type [R07.9]  DISCHARGE DIAGNOSIS:  Active Problems:   Chest pain  SECONDARY DIAGNOSIS:   Past Medical History:  Diagnosis Date  . CAD (coronary artery disease)   . CKD (chronic kidney disease)   . Diabetes mellitus without complication ([place])   . GERD (gastroesophageal reflux disease)   . Hyperlipemia   . Hypertension     HOSPITAL COURSE:   73 year old male with history of DM, chronic kidney disease stage III and diabetes who presents with chest pain.  1.Chest pain: Patient evaluated by cardiology.  Patient's EKG, telemetry monitoring and troponins were essentially unremarkable.  Patient had no chest pain while in hospital.  Patient with outpatient follow-up with cardiology.  2.  CAD: Continue aspirin, statin, lisinopril  3.  Diabetes: Continue Metformin with ADA diet  4.  Essential hypertension: Continue Norvasc   DISCHARGE CONDITIONS AND DIET:   Stable for discharge heart healthy diet diabetic diet  CONSULTS OBTAINED:  Treatment Team:  Yolonda Kida, MD  DRUG ALLERGIES:  No Known Allergies  DISCHARGE MEDICATIONS:   Allergies as of 03/16/2019   No Known Allergies     Medication List    TAKE these medications   amLODipine 10 MG tablet Commonly known as: NORVASC Take 1 tablet (10 mg total) by mouth daily.   aspirin 81 MG chewable tablet Chew 1 tablet (81 mg total) by mouth daily.   atorvastatin 80 MG tablet Commonly known as: LIPITOR Take 1 tablet by mouth daily.   lisinopril 10 MG tablet Commonly known as: ZESTRIL Take 1 tablet (10 mg total) by mouth daily.   metFORMIN 500 MG  24 hr tablet Commonly known as: GLUCOPHAGE-XR Take 1,000 mg by mouth daily with breakfast.   nitroGLYCERIN 0.4 MG SL tablet Commonly known as: NITROSTAT Place 0.4 mg under the tongue every 5 (five) minutes as needed for chest pain.   pantoprazole 40 MG tablet Commonly known as: PROTONIX Take 40 mg by mouth daily.   pantoprazole 40 MG tablet Commonly known as: Protonix Take 1 tablet (40 mg total) by mouth daily.   ranitidine 150 MG tablet Commonly known as: ZANTAC Take 150 mg by mouth 2 (two) times daily.         Today   CHIEF COMPLAINT:  Without chest pain and patient is ready for discharge today no acute issues overnight.   VITAL SIGNS:  Blood pressure 104/62, pulse (!) 49, temperature 98 F (36.7 C), temperature source Oral, resp. rate 18, height 5\' 8"  (1.727 m), weight 112.5 kg, SpO2 99 %.   REVIEW OF SYSTEMS:  Review of Systems  Constitutional: Negative.  Negative for chills, fever and malaise/fatigue.  HENT: Negative.  Negative for ear discharge, ear pain, hearing loss, nosebleeds and sore throat.   Eyes: Negative.  Negative for blurred vision and pain.  Respiratory: Negative.  Negative for cough, hemoptysis, shortness of breath and wheezing.   Cardiovascular: Negative.  Negative for chest pain, palpitations and leg swelling.  Gastrointestinal: Negative.  Negative for abdominal pain, blood in stool, diarrhea, nausea and vomiting.  Genitourinary: Negative.  Negative for dysuria.  Musculoskeletal: Negative.  Negative for back pain.  Skin: Negative.   Neurological: Negative for dizziness, tremors, speech change, focal weakness, seizures and headaches.  Endo/Heme/Allergies: Negative.  Does not bruise/bleed easily.  Psychiatric/Behavioral: Negative.  Negative for depression, hallucinations and suicidal ideas.     PHYSICAL EXAMINATION:  GENERAL:  73 y.o.-year-old patient lying in the bed with no acute distress.  NECK:  Supple, no jugular venous distention. No  thyroid enlargement, no tenderness.  LUNGS: Normal breath sounds bilaterally, no wheezing, rales,rhonchi  No use of accessory muscles of respiration.  CARDIOVASCULAR: S1, S2 normal. No murmurs, rubs, or gallops.  ABDOMEN: Soft, non-tender, non-distended. Bowel sounds present. No organomegaly or mass.  EXTREMITIES: No pedal edema, cyanosis, or clubbing.  PSYCHIATRIC: The patient is alert and oriented x 3.  SKIN: No obvious rash, lesion, or ulcer.   DATA REVIEW:   CBC Recent Labs  Lab 03/15/19 0049  WBC 10.2  HGB 10.6*  HCT 32.3*  PLT 314    Chemistries  Recent Labs  Lab 03/15/19 0049  03/16/19 0625  NA 136   < > 138  K 4.4   < > 4.8  CL 105   < > 105  CO2 22   < > 21*  GLUCOSE 227*   < > 141*  BUN 23   < > 23  CREATININE 1.39*   < > 1.50*  CALCIUM 9.1   < > 9.5  MG  --   --  2.4  AST 25  --   --   ALT 21  --   --   ALKPHOS 56  --   --   BILITOT 0.4  --   --    < > = values in this interval not displayed.    Cardiac Enzymes No results for input(s): TROPONINI in the last 168 hours.  Microbiology Results  @MICRORSLT48 @  RADIOLOGY:  Dg Chest Portable 1 View  Result Date: 03/15/2019 CLINICAL DATA:  Left-sided chest pain, diaphoresis EXAM: PORTABLE CHEST 1 VIEW COMPARISON:  Radiograph Oct 22, 2018 FINDINGS: Postsurgical changes related to prior CABG including intact and aligned sternotomy wires and multiple surgical clips projecting over the mediastinum. Stable cardiomegaly. No consolidation, features of edema, pneumothorax, or effusion. Degenerative changes are present in the imaged spine and shoulders. No acute osseous or soft tissue abnormality IMPRESSION: Postsurgical changes related to prior CABG. No acute cardiopulmonary disease. Electronically Signed   By: Lovena Le M.D.   On: 03/15/2019 01:46      Allergies as of 03/16/2019   No Known Allergies     Medication List    TAKE these medications   amLODipine 10 MG tablet Commonly known as: NORVASC Take 1  tablet (10 mg total) by mouth daily.   aspirin 81 MG chewable tablet Chew 1 tablet (81 mg total) by mouth daily.   atorvastatin 80 MG tablet Commonly known as: LIPITOR Take 1 tablet by mouth daily.   lisinopril 10 MG tablet Commonly known as: ZESTRIL Take 1 tablet (10 mg total) by mouth daily.   metFORMIN 500 MG 24 hr tablet Commonly known as: GLUCOPHAGE-XR Take 1,000 mg by mouth daily with breakfast.   nitroGLYCERIN 0.4 MG SL tablet Commonly known as: NITROSTAT Place 0.4 mg under the tongue every 5 (five) minutes as needed for chest pain.   pantoprazole 40 MG tablet Commonly known as: PROTONIX Take 40 mg by mouth daily.   pantoprazole 40 MG tablet Commonly known as: Protonix Take 1 tablet (40 mg total) by mouth daily.   ranitidine 150 MG tablet  Commonly known as: ZANTAC Take 150 mg by mouth 2 (two) times daily.         Management plans discussed with the patient and he is in agreement. Stable for discharge home  Patient should follow up with cardiology  CODE STATUS:     Code Status Orders  (From admission, onward)         Start     Ordered   03/15/19 0236  Full code  Continuous     03/15/19 0237        Code Status History    Date Active Date Inactive Code Status Order ID Comments User Context   10/22/2018 0424 10/22/2018 1808 Full Code [id number]  Mayer Camel, NP ED   09/15/2018 0220 09/15/2018 1744 Full Code [id number]  Lance Coon, MD ED   08/07/2017 1309 08/07/2017 1734 Full Code [id number]  Yolonda Kida, MD Inpatient   12/07/2015 1430 12/08/2015 1510 Full Code [id number]  Epifanio Lesches, MD ED   Advance Care Planning Activity      TOTAL TIME TAKING CARE OF THIS PATIENT: 38 minutes.    Note: This dictation was prepared with Dragon dictation along with smaller phrase technology. Any transcriptional errors that result from this process are unintentional.  Bettey Costa M.D on 03/16/2019 at 12:02 PM  Between 7am to 6pm - Pager -  [phone] After 6pm go to [email or web address] - password [email or web address] [facility]  Office  [phone]  CC: Primary care physician; [facility] [place]

## 2019-03-16 NOTE — Plan of Care (Signed)
Patient walking around room & ready for discharge, no chest pain, will follow-up with [facility], [facility] cardiology as outpatient.

## 2019-05-20 ENCOUNTER — Emergency Department: Payer: Medicare HMO

## 2019-05-20 ENCOUNTER — Other Ambulatory Visit: Payer: Self-pay

## 2019-05-20 DIAGNOSIS — Z5321 Procedure and treatment not carried out due to patient leaving prior to being seen by health care provider: Secondary | ICD-10-CM | POA: Diagnosis not present

## 2019-05-20 DIAGNOSIS — R0789 Other chest pain: Secondary | ICD-10-CM | POA: Insufficient documentation

## 2019-05-20 LAB — BASIC METABOLIC PANEL
Anion gap: 10 (ref 5–15)
BUN: 20 mg/dL (ref 8–23)
CO2: 22 mmol/L (ref 22–32)
Calcium: 9.8 mg/dL (ref 8.9–10.3)
Chloride: 104 mmol/L (ref 98–111)
Creatinine, Ser: 1.39 mg/dL — ABNORMAL HIGH (ref 0.61–1.24)
GFR calc Af Amer: 58 mL/min — ABNORMAL LOW (ref >60)
GFR calc non Af Amer: 50 mL/min — ABNORMAL LOW (ref >60)
Glucose, Bld: 129 mg/dL — ABNORMAL HIGH (ref 70–99)
Potassium: 3.7 mmol/L (ref 3.5–5.1)
Sodium: 136 mmol/L (ref 135–145)

## 2019-05-20 LAB — CBC
HCT: 34.1 % — ABNORMAL LOW (ref 39.0–52.0)
Hemoglobin: 11.3 g/dL — ABNORMAL LOW (ref 13.0–17.0)
MCH: 29.6 pg (ref 26.0–34.0)
MCHC: 33.1 g/dL (ref 30.0–36.0)
MCV: 89.3 fL (ref 80.0–100.0)
Platelets: 300 10*3/uL (ref 150–400)
RBC: 3.82 MIL/uL — ABNORMAL LOW (ref 4.22–5.81)
RDW: 14.1 % (ref 11.5–15.5)
WBC: 9.1 10*3/uL (ref 4.0–10.5)
nRBC: 0 % (ref 0.0–0.2)

## 2019-05-20 LAB — TROPONIN I (HIGH SENSITIVITY): Troponin I (High Sensitivity): 11 ng/L (ref <18)

## 2019-05-20 NOTE — ED Triage Notes (Signed)
Reports chest pain that started approx 1400 today. Left sided. Denies SOB, diaphoresis, NV. Pt alert and oriented X4, cooperative, RR even and unlabored, color WNL. Pt in NAD.

## 2019-05-21 ENCOUNTER — Emergency Department
Admission: EM | Admit: 2019-05-21 | Discharge: 2019-05-21 | Disposition: A | Discharge status: Home / Self Care | Payer: Medicare HMO | Attending: Emergency Medicine | Admitting: Emergency Medicine

## 2019-05-22 ENCOUNTER — Emergency Department: Payer: Medicare HMO

## 2019-05-22 ENCOUNTER — Other Ambulatory Visit: Payer: Self-pay

## 2019-05-22 ENCOUNTER — Emergency Department
Admission: EM | Admit: 2019-05-22 | Discharge: 2019-05-22 | Disposition: A | Discharge status: Home / Self Care | Payer: Medicare HMO | Attending: Emergency Medicine | Admitting: Emergency Medicine

## 2019-05-22 DIAGNOSIS — E1122 Type 2 diabetes mellitus with diabetic chronic kidney disease: Secondary | ICD-10-CM | POA: Insufficient documentation

## 2019-05-22 DIAGNOSIS — Z7982 Long term (current) use of aspirin: Secondary | ICD-10-CM | POA: Diagnosis not present

## 2019-05-22 DIAGNOSIS — I2510 Atherosclerotic heart disease of native coronary artery without angina pectoris: Secondary | ICD-10-CM | POA: Diagnosis not present

## 2019-05-22 DIAGNOSIS — Z7984 Long term (current) use of oral hypoglycemic drugs: Secondary | ICD-10-CM | POA: Insufficient documentation

## 2019-05-22 DIAGNOSIS — I129 Hypertensive chronic kidney disease with stage 1 through stage 4 chronic kidney disease, or unspecified chronic kidney disease: Secondary | ICD-10-CM | POA: Insufficient documentation

## 2019-05-22 DIAGNOSIS — Z79899 Other long term (current) drug therapy: Secondary | ICD-10-CM | POA: Insufficient documentation

## 2019-05-22 DIAGNOSIS — F17220 Nicotine dependence, chewing tobacco, uncomplicated: Secondary | ICD-10-CM | POA: Diagnosis not present

## 2019-05-22 DIAGNOSIS — N189 Chronic kidney disease, unspecified: Secondary | ICD-10-CM | POA: Diagnosis not present

## 2019-05-22 DIAGNOSIS — R0789 Other chest pain: Secondary | ICD-10-CM | POA: Diagnosis not present

## 2019-05-22 LAB — CBC WITH DIFFERENTIAL/PLATELET
Abs Immature Granulocytes: 0.02 10*3/uL (ref 0.00–0.07)
Basophils Absolute: 0 10*3/uL (ref 0.0–0.1)
Basophils Relative: 0 %
Eosinophils Absolute: 0.3 10*3/uL (ref 0.0–0.5)
Eosinophils Relative: 4 %
HCT: 35.1 % — ABNORMAL LOW (ref 39.0–52.0)
Hemoglobin: 11.4 g/dL — ABNORMAL LOW (ref 13.0–17.0)
Immature Granulocytes: 0 %
Lymphocytes Relative: 25 %
Lymphs Abs: 2.3 10*3/uL (ref 0.7–4.0)
MCH: 29.4 pg (ref 26.0–34.0)
MCHC: 32.5 g/dL (ref 30.0–36.0)
MCV: 90.5 fL (ref 80.0–100.0)
Monocytes Absolute: 0.8 10*3/uL (ref 0.1–1.0)
Monocytes Relative: 9 %
Neutro Abs: 5.7 10*3/uL (ref 1.7–7.7)
Neutrophils Relative %: 62 %
Platelets: 309 10*3/uL (ref 150–400)
RBC: 3.88 MIL/uL — ABNORMAL LOW (ref 4.22–5.81)
RDW: 14.2 % (ref 11.5–15.5)
WBC: 9.3 10*3/uL (ref 4.0–10.5)
nRBC: 0 % (ref 0.0–0.2)

## 2019-05-22 LAB — COMPREHENSIVE METABOLIC PANEL
ALT: 26 U/L (ref 0–44)
AST: 25 U/L (ref 15–41)
Albumin: 3.7 g/dL (ref 3.5–5.0)
Alkaline Phosphatase: 57 U/L (ref 38–126)
Anion gap: 11 (ref 5–15)
BUN: 19 mg/dL (ref 8–23)
CO2: 18 mmol/L — ABNORMAL LOW (ref 22–32)
Calcium: 9.1 mg/dL (ref 8.9–10.3)
Chloride: 109 mmol/L (ref 98–111)
Creatinine, Ser: 1.43 mg/dL — ABNORMAL HIGH (ref 0.61–1.24)
GFR calc Af Amer: 56 mL/min — ABNORMAL LOW (ref >60)
GFR calc non Af Amer: 49 mL/min — ABNORMAL LOW (ref >60)
Glucose, Bld: 132 mg/dL — ABNORMAL HIGH (ref 70–99)
Potassium: 4.1 mmol/L (ref 3.5–5.1)
Sodium: 138 mmol/L (ref 135–145)
Total Bilirubin: 0.5 mg/dL (ref 0.3–1.2)
Total Protein: 7.5 g/dL (ref 6.5–8.1)

## 2019-05-22 LAB — TROPONIN I (HIGH SENSITIVITY)
Troponin I (High Sensitivity): 11 ng/L (ref <18)
Troponin I (High Sensitivity): 11 ng/L (ref <18)

## 2019-05-22 LAB — LIPASE, BLOOD: Lipase: 47 U/L (ref 11–51)

## 2019-05-22 MED ORDER — ALUM & MAG HYDROXIDE-SIMETH 200-200-20 MG/5ML PO SUSP
30.0000 mL | Freq: Once | ORAL | Status: AC
  Administered 2019-05-22: 30 mL via ORAL
  Filled 2019-05-22: qty 30

## 2019-05-22 MED ORDER — FAMOTIDINE 20 MG PO TABS
40.0000 mg | ORAL_TABLET | Freq: Once | ORAL | Status: AC
  Administered 2019-05-22: 40 mg via ORAL
  Filled 2019-05-22: qty 2

## 2019-05-22 NOTE — ED Triage Notes (Signed)
Pt arrives from home with complaint of chest pain, intermittent since Friday. Pt reports tightness in chest. Pt came to the ED on Friday for chest pain, but left before being seen. EMS reported NSR with no elevation/depression noted, gave 325mg  aspirin and 1 dose of nitro. Pt appears stable and NAD noted.

## 2019-05-22 NOTE — Discharge Instructions (Addendum)
Your labs and other test today were all okay.  Please follow-up with your doctors this week for continued monitoring of your symptoms

## 2019-05-22 NOTE — ED Provider Notes (Signed)
[facility] Emergency Department Provider Note  ____________________________________________  Time seen: Approximately 10:46 PM  I have reviewed the triage vital signs and the nursing notes.   HISTORY  Chief Complaint Chest Pain    HPI Justin Keith is a 73 y.o. male with a history of CAD CKD diabetes GERD hypertension who comes the ED complaining of left-sided chest pain  this started at 2:00 PM today.  Intermittent lasting 15 minutes at a time.  Nonradiating.  No shortness of breath diaphoresis or vomiting.  No palpitations dizziness or syncope.  Not pleuritic, not exertional.  Denies fever body aches chills or sick contacts     Past Medical History:  Diagnosis Date  . CAD (coronary artery disease)   . CKD (chronic kidney disease)   . Diabetes mellitus without complication ([place])   . GERD (gastroesophageal reflux disease)   . Hyperlipemia   . Hypertension      Patient Active Problem List   Diagnosis Date Noted  . Chest pain with high risk of acute coronary syndrome 03/15/2019  . Problems with swallowing and mastication   . Chest pain in adult 10/22/2018  . HTN (hypertension) 09/15/2018  . HLD (hyperlipidemia) 09/15/2018  . GERD (gastroesophageal reflux disease) 09/15/2018  . CAD (coronary artery disease) 09/15/2018  . Diabetes ([place]) 09/15/2018  . Chest pain 12/07/2015     Past Surgical History:  Procedure Laterality Date  . CARDIAC CATHETERIZATION    . CARDIAC SURGERY    . COLONOSCOPY WITH PROPOFOL N/A 02/25/2018   Procedure: COLONOSCOPY WITH PROPOFOL;  Surgeon: Jonathon Bellows, MD;  Location: [facility] ENDOSCOPY;  Service: Gastroenterology;  Laterality: N/A;  . COLONOSCOPY WITH PROPOFOL N/A 02/26/2018   Procedure: COLONOSCOPY WITH PROPOFOL;  Surgeon: Jonathon Bellows, MD;  Location: [facility] ENDOSCOPY;  Service: Gastroenterology;  Laterality: N/A;  . ESOPHAGOGASTRODUODENOSCOPY (EGD) WITH PROPOFOL N/A 01/18/2019   Procedure: ESOPHAGOGASTRODUODENOSCOPY  (EGD) WITH PROPOFOL;  Surgeon: Lucilla Lame, MD;  Location: [facility] ENDOSCOPY;  Service: Endoscopy;  Laterality: N/A;  . FOREIGN BODY REMOVAL N/A 03/07/2018   Procedure: FOREIGN BODY REMOVAL;  Surgeon: Jonathon Bellows, MD;  Location: [facility] ENDOSCOPY;  Service: Gastroenterology;  Laterality: N/A;  . LEFT HEART CATH AND CORS/GRAFTS ANGIOGRAPHY N/A 08/07/2017   Procedure: LEFT HEART CATH AND CORS/GRAFTS ANGIOGRAPHY;  Surgeon: Yolonda Kida, MD;  Location: [facility];  Service: Cardiovascular;  Laterality: N/A;  . PROSTATE SURGERY    . SHOULDER ARTHROSCOPY       Prior to Admission medications   Medication Sig Start Date End Date Taking? Authorizing Provider  amLODipine (NORVASC) 10 MG tablet Take 1 tablet (10 mg total) by mouth daily. 12/08/15   Vaughan Basta, MD  aspirin 81 MG chewable tablet Chew 1 tablet (81 mg total) by mouth daily. 12/08/15   Vaughan Basta, MD  atorvastatin (LIPITOR) 80 MG tablet Take 1 tablet by mouth daily.    [provider]  lisinopril (PRINIVIL,ZESTRIL) 10 MG tablet Take 1 tablet (10 mg total) by mouth daily. 12/08/15   Vaughan Basta, MD  metFORMIN (GLUCOPHAGE-XR) 500 MG 24 hr tablet Take 1,000 mg by mouth daily with breakfast.     [provider]  nitroGLYCERIN (NITROSTAT) 0.4 MG SL tablet Place 0.4 mg under the tongue every 5 (five) minutes as needed for chest pain.    [provider]  pantoprazole (PROTONIX) 40 MG tablet Take 1 tablet (40 mg total) by mouth daily. 01/18/19 01/18/20  Lucilla Lame, MD  pantoprazole (PROTONIX) 40 MG tablet Take 40 mg  by mouth daily.    [provider]  ranitidine (ZANTAC) 150 MG tablet Take 150 mg by mouth 2 (two) times daily.     [provider]     Allergies Patient has no known allergies.   No family history on file.  Social History Social History   Tobacco Use  . Smoking status: Never Smoker  . Smokeless tobacco: Current User    Types: Chew  Substance  Use Topics  . Alcohol use: No  . Drug use: Not Currently    Review of Systems  Constitutional:   No fever or chills.  ENT:   No sore throat. No rhinorrhea. Cardiovascular: Positive chest pain as above without syncope. Respiratory:   No dyspnea or cough. Gastrointestinal:   Negative for abdominal pain, vomiting and diarrhea.  Musculoskeletal:   Negative for focal pain or swelling All other systems reviewed and are negative except as documented above in ROS and HPI.  ____________________________________________   PHYSICAL EXAM:  VITAL SIGNS: ED Triage Vitals  Enc Vitals Group     BP 05/22/19 1914 137/67     Pulse Rate 05/22/19 1914 67     Resp 05/22/19 1914 18     Temp 05/22/19 2056 98.3 F (36.8 C)     Temp Source 05/22/19 2056 Oral     SpO2 05/22/19 1909 95 %     Weight 05/22/19 1915 255 lb (115.7 kg)     Height 05/22/19 1915 5\' 8"  (1.727 m)     Head Circumference --      Peak Flow --      Pain Score 05/22/19 1915 8     Pain Loc --      Pain Edu? --      Excl. in [place]? --     Vital signs reviewed, nursing assessments reviewed.   Constitutional:   Alert and oriented. Non-toxic appearance. Eyes:   Conjunctivae are normal. EOMI. PERRL. ENT      Head:   Normocephalic and atraumatic.      Nose:   Wearing a mask.      Mouth/Throat:   Wearing a mask.      Neck:   No meningismus. Full ROM. Hematological/Lymphatic/Immunilogical:   No cervical lymphadenopathy. Cardiovascular:   RRR. Symmetric bilateral radial and DP pulses.  No murmurs. Cap refill less than 2 seconds. Respiratory:   Normal respiratory effort without tachypnea/retractions. Breath sounds are clear and equal bilaterally. No wheezes/rales/rhonchi. Gastrointestinal:   Soft and nontender. Non distended. There is no CVA tenderness.  No rebound, rigidity, or guarding.  Musculoskeletal:   Normal range of motion in all extremities. No joint effusions.  No lower extremity tenderness.  No edema.  Chest wall tenderness  over the anterior inferior ribs on the left side which reproduces his pain. Neurologic:   Normal speech and language.  Motor grossly intact. No acute focal neurologic deficits are appreciated.  Skin:    Skin is warm, dry and intact. No rash noted.  No petechiae, purpura, or bullae.  ____________________________________________    LABS (pertinent positives/negatives) (all labs ordered are listed, but only abnormal results are displayed) Labs Reviewed  COMPREHENSIVE METABOLIC PANEL - Abnormal; Notable for the following components:      Result Value   CO2 18 (*)    Glucose, Bld 132 (*)    Creatinine, Ser 1.43 (*)    GFR calc non Af Amer 49 (*)    GFR calc Af Amer 56 (*)    All other components  within normal limits  CBC WITH DIFFERENTIAL/PLATELET - Abnormal; Notable for the following components:   RBC 3.88 (*)    Hemoglobin 11.4 (*)    HCT 35.1 (*)    All other components within normal limits  LIPASE, BLOOD  TROPONIN I (HIGH SENSITIVITY)  TROPONIN I (HIGH SENSITIVITY)   ____________________________________________   EKG  Interpreted by me  Date: 05/22/2019  Rate: 70  Rhythm: normal sinus rhythm  QRS Axis: normal  Intervals: normal  ST/T Wave abnormalities: normal  Conduction Disutrbances: none  Narrative Interpretation: unremarkable      ____________________________________________    RADIOLOGY  DG Chest Portable 1 View  Result Date: 05/22/2019 CLINICAL DATA:  Left-sided chest pain EXAM: PORTABLE CHEST 1 VIEW COMPARISON:  May 20, 2019 FINDINGS: Patient is status post prior median sternotomy/CABG. The lung volumes are slightly low. Bibasilar atelectasis is noted. There is no pneumothorax. No large pleural effusion. There is no acute osseous abnormality. IMPRESSION: 1. Low lung volumes with bibasilar atelectasis. 2. Stable appearance of the mediastinum status post prior median sternotomy/CABG. Electronically Signed   By: Constance Holster M.D.   On: 05/22/2019  19:35    ____________________________________________   PROCEDURES Procedures  ____________________________________________  DIFFERENTIAL DIAGNOSIS   Pneumonia, pneumothorax, pleural effusion, pulmonary edema, non-STEMI, GERD, chest wall pain  CLINICAL IMPRESSION / ASSESSMENT AND PLAN / ED COURSE  Medications ordered in the ED: Medications  alum & mag hydroxide-simeth (MAALOX/MYLANTA) 200-200-20 MG/5ML suspension 30 mL (30 mLs Oral Given 05/22/19 2124)  famotidine (PEPCID) tablet 40 mg (40 mg Oral Given 05/22/19 2124)    Pertinent labs & imaging results that were available during my care of the patient were reviewed by me and considered in my medical decision making (see chart for details).  [name] was evaluated in Emergency Department on 05/22/2019 for the symptoms described in the history of present illness. He was evaluated in the context of the global COVID-19 pandemic, which necessitated consideration that the patient might be at risk for infection with the SARS-CoV-2 virus that causes COVID-19. Institutional protocols and algorithms that pertain to the evaluation of patients at risk for COVID-19 are in a state of rapid change based on information released by regulatory bodies including the CDC and federal and state organizations. These policies and algorithms were followed during the patient's care in the ED.     Clinical Course as of May 21 2245  Sun May 22, 2019  1935 Patient presents with reproducible, atypical chest pain starting earlier today at about 2:00 PM.  It is intermittent.  Vital signs are normal, EKG is normal.  I will check chest x-ray and serial troponins with other labs due to his age and comorbidities.  Not consistent with unstable angina, STEMI, PE, dissection, pneumonia, pneumothorax, sepsis.  If serial troponins rule out non-STEMI, patient will be stable for discharge home and outpatient follow-up with his cardiologist, Dr. Clayborn Bigness.   [PS]     Clinical Course User Index [PS] Carrie Mew, MD     ----------------------------------------- 10:47 PM on 05/22/2019 -----------------------------------------  Repeat troponin normal, vital signs remain normal.  With reassuring work-up, normal EKG, exam consistent with musculoskeletal pain, patient is stable for discharge home and outpatient follow-up  ____________________________________________   FINAL CLINICAL IMPRESSION(S) / ED DIAGNOSES    Final diagnoses:  Atypical chest pain  Chest wall pain   ED Discharge Orders    None      Portions of this note were generated with dragon dictation software. Dictation errors may  occur despite best attempts at proofreading.   Carrie Mew, MD 05/22/19 2249

## 2019-07-20 ENCOUNTER — Emergency Department: Payer: Medicare HMO

## 2019-07-20 ENCOUNTER — Encounter: Payer: Self-pay | Admitting: Emergency Medicine

## 2019-07-20 ENCOUNTER — Other Ambulatory Visit: Payer: Self-pay

## 2019-07-20 ENCOUNTER — Emergency Department
Admission: EM | Admit: 2019-07-20 | Discharge: 2019-07-20 | Disposition: A | Discharge status: Home / Self Care | Payer: Medicare HMO | Attending: Emergency Medicine | Admitting: Emergency Medicine

## 2019-07-20 DIAGNOSIS — R0789 Other chest pain: Secondary | ICD-10-CM | POA: Diagnosis not present

## 2019-07-20 DIAGNOSIS — R1012 Left upper quadrant pain: Secondary | ICD-10-CM | POA: Diagnosis not present

## 2019-07-20 DIAGNOSIS — Z7982 Long term (current) use of aspirin: Secondary | ICD-10-CM | POA: Insufficient documentation

## 2019-07-20 DIAGNOSIS — N189 Chronic kidney disease, unspecified: Secondary | ICD-10-CM

## 2019-07-20 DIAGNOSIS — R079 Chest pain, unspecified: Secondary | ICD-10-CM

## 2019-07-20 DIAGNOSIS — Z87891 Personal history of nicotine dependence: Secondary | ICD-10-CM | POA: Diagnosis not present

## 2019-07-20 DIAGNOSIS — R748 Abnormal levels of other serum enzymes: Secondary | ICD-10-CM | POA: Insufficient documentation

## 2019-07-20 DIAGNOSIS — I129 Hypertensive chronic kidney disease with stage 1 through stage 4 chronic kidney disease, or unspecified chronic kidney disease: Secondary | ICD-10-CM | POA: Insufficient documentation

## 2019-07-20 DIAGNOSIS — R778 Other specified abnormalities of plasma proteins: Secondary | ICD-10-CM

## 2019-07-20 DIAGNOSIS — R109 Unspecified abdominal pain: Secondary | ICD-10-CM

## 2019-07-20 LAB — URINALYSIS, COMPLETE (UACMP) WITH MICROSCOPIC
Bacteria, UA: NONE SEEN
Bilirubin Urine: NEGATIVE
Glucose, UA: NEGATIVE mg/dL
Hgb urine dipstick: NEGATIVE
Ketones, ur: NEGATIVE mg/dL
Nitrite: NEGATIVE
Protein, ur: NEGATIVE mg/dL
Specific Gravity, Urine: 1.006 (ref 1.005–1.030)
pH: 5 (ref 5.0–8.0)

## 2019-07-20 LAB — COMPREHENSIVE METABOLIC PANEL
ALT: 20 U/L (ref 0–44)
AST: 21 U/L (ref 15–41)
Albumin: 3.7 g/dL (ref 3.5–5.0)
Alkaline Phosphatase: 52 U/L (ref 38–126)
Anion gap: 8 (ref 5–15)
BUN: 19 mg/dL (ref 8–23)
CO2: 24 mmol/L (ref 22–32)
Calcium: 9.3 mg/dL (ref 8.9–10.3)
Chloride: 105 mmol/L (ref 98–111)
Creatinine, Ser: 1.52 mg/dL — ABNORMAL HIGH (ref 0.61–1.24)
GFR calc Af Amer: 52 mL/min — ABNORMAL LOW (ref >60)
GFR calc non Af Amer: 45 mL/min — ABNORMAL LOW (ref >60)
Glucose, Bld: 141 mg/dL — ABNORMAL HIGH (ref 70–99)
Potassium: 3.6 mmol/L (ref 3.5–5.1)
Sodium: 137 mmol/L (ref 135–145)
Total Bilirubin: 0.4 mg/dL (ref 0.3–1.2)
Total Protein: 7.3 g/dL (ref 6.5–8.1)

## 2019-07-20 LAB — CBC
HCT: 35 % — ABNORMAL LOW (ref 39.0–52.0)
Hemoglobin: 11.7 g/dL — ABNORMAL LOW (ref 13.0–17.0)
MCH: 30.1 pg (ref 26.0–34.0)
MCHC: 33.4 g/dL (ref 30.0–36.0)
MCV: 90 fL (ref 80.0–100.0)
Platelets: 299 10*3/uL (ref 150–400)
RBC: 3.89 MIL/uL — ABNORMAL LOW (ref 4.22–5.81)
RDW: 14 % (ref 11.5–15.5)
WBC: 9.7 10*3/uL (ref 4.0–10.5)
nRBC: 0 % (ref 0.0–0.2)

## 2019-07-20 LAB — LIPASE, BLOOD: Lipase: 25 U/L (ref 11–51)

## 2019-07-20 LAB — TROPONIN I (HIGH SENSITIVITY)
Troponin I (High Sensitivity): 22 ng/L — ABNORMAL HIGH (ref <18)
Troponin I (High Sensitivity): 20 ng/L — ABNORMAL HIGH (ref <18)

## 2019-07-20 NOTE — ED Triage Notes (Signed)
Pt states he is having pain on the left side chest pain as well as abdominal pain and flank pain. Pt alert and oriented on arrival. No distress noted.

## 2019-07-20 NOTE — ED Provider Notes (Signed)
[facility] Emergency Department Provider Note  ____________________________________________   First MD Initiated Contact with Patient 07/20/19 0230     (approximate)  I have reviewed the triage vital signs and the nursing notes.   HISTORY  Chief Complaint Abdominal Pain and Flank Pain    HPI Justin Keith is a 74 y.o. male with medical history as listed below who presents for evaluation of acute onset left-sided abdominal, flank, and chest pain.  He said that the symptoms started around 6 PM today.  They came and went but then came back stronger tonight so he came in for further evaluation.  He said that while he was waiting upfront they went away and have been gone completely.  He has had no pain when he urinates.   The pain is sharp at times, comes and goes, and was not associated with any nausea, vomiting, nor anterior lower abdominal pain.  No shortness of breath.  He just recently received his second COVID-19 vaccination.  He denies fever/chills, sore throat, loss of smell and taste, cough.  Nothing in particular made the symptoms better or worse.        Past Medical History:  Diagnosis Date  . CAD (coronary artery disease)   . CKD (chronic kidney disease)   . Diabetes mellitus without complication ([place])   . GERD (gastroesophageal reflux disease)   . Hyperlipemia   . Hypertension     Patient Active Problem List   Diagnosis Date Noted  . Chest pain with high risk of acute coronary syndrome 03/15/2019  . Problems with swallowing and mastication   . Chest pain in adult 10/22/2018  . HTN (hypertension) 09/15/2018  . HLD (hyperlipidemia) 09/15/2018  . GERD (gastroesophageal reflux disease) 09/15/2018  . CAD (coronary artery disease) 09/15/2018  . Diabetes ([place]) 09/15/2018  . Chest pain 12/07/2015    Past Surgical History:  Procedure Laterality Date  . CARDIAC CATHETERIZATION    . CARDIAC SURGERY    . COLONOSCOPY WITH PROPOFOL N/A  02/25/2018   Procedure: COLONOSCOPY WITH PROPOFOL;  Surgeon: Jonathon Bellows, MD;  Location: [facility] ENDOSCOPY;  Service: Gastroenterology;  Laterality: N/A;  . COLONOSCOPY WITH PROPOFOL N/A 02/26/2018   Procedure: COLONOSCOPY WITH PROPOFOL;  Surgeon: Jonathon Bellows, MD;  Location[facility] ENDOSCOPY;  Service: Gastroenterology;  Laterality: N/A;  . ESOPHAGOGASTRODUODENOSCOPY (EGD) WITH PROPOFOL N/A 01/18/2019   Procedure: ESOPHAGOGASTRODUODENOSCOPY (EGD) WITH PROPOFOL;  Surgeon: Lucilla Lame, MD;  Location: [facility] ENDOSCOPY;  Service: Endoscopy;  Laterality: N/A;  . FOREIGN BODY REMOVAL N/A 03/07/2018   Procedure: FOREIGN BODY REMOVAL;  Surgeon: Jonathon Bellows, MD;  Location: [facility] ENDOSCOPY;  Service: Gastroenterology;  Laterality: N/A;  . LEFT HEART CATH AND CORS/GRAFTS ANGIOGRAPHY N/A 08/07/2017   Procedure: LEFT HEART CATH AND CORS/GRAFTS ANGIOGRAPHY;  Surgeon: Yolonda Kida, MD;  Location: [place] CV LAB;  Service: Cardiovascular;  Laterality: N/A;  . PROSTATE SURGERY    . SHOULDER ARTHROSCOPY      Prior to Admission medications   Medication Sig Start Date End Date Taking? Authorizing Provider  amLODipine (NORVASC) 10 MG tablet Take 1 tablet (10 mg total) by mouth daily. 12/08/15   Vaughan Basta, MD  aspirin 81 MG chewable tablet Chew 1 tablet (81 mg total) by mouth daily. 12/08/15   Vaughan Basta, MD  atorvastatin (LIPITOR) 80 MG tablet Take 1 tablet by mouth daily.    [provider]  lisinopril (PRINIVIL,ZESTRIL) 10 MG tablet Take 1 tablet (10 mg total) by mouth daily. 12/08/15  Vaughan Basta, MD  metFORMIN (GLUCOPHAGE-XR) 500 MG 24 hr tablet Take 1,000 mg by mouth daily with breakfast.     [provider]  nitroGLYCERIN (NITROSTAT) 0.4 MG SL tablet Place 0.4 mg under the tongue every 5 (five) minutes as needed for chest pain.    [provider]  pantoprazole (PROTONIX) 40 MG tablet Take 1 tablet (40 mg total) by mouth daily. 01/18/19 01/18/20  Lucilla Lame, MD  pantoprazole (PROTONIX) 40 MG tablet Take 40 mg by mouth daily.    [provider]  ranitidine (ZANTAC) 150 MG tablet Take 150 mg by mouth 2 (two) times daily.     [provider]    Allergies Patient has no known allergies.  No family history on file.  Social History Social History   Tobacco Use  . Smoking status: Never Smoker  . Smokeless tobacco: Current User    Types: Chew  Substance Use Topics  . Alcohol use: No  . Drug use: Not Currently    Review of Systems Constitutional: No fever/chills Eyes: No visual changes. ENT: No sore throat. Cardiovascular: Some left-sided chest pain Respiratory: Denies shortness of breath. Gastrointestinal: Some left-sided abdominal and flank pain. Genitourinary: Negative for dysuria. Musculoskeletal: Left-sided flank pain. Integumentary: Negative for rash. Neurological: Negative for headaches, focal weakness or numbness.   ____________________________________________   PHYSICAL EXAM:  VITAL SIGNS: ED Triage Vitals  Enc Vitals Group     BP 07/20/19 0018 (!) 145/71     Pulse Rate 07/20/19 0018 63     Resp 07/20/19 0018 (!) 22     Temp 07/20/19 0018 98.4 F (36.9 C)     Temp Source 07/20/19 0018 Oral     SpO2 07/20/19 0018 96 %     Weight 07/20/19 0019 117.9 kg (260 lb)     Height 07/20/19 0019 1.727 m (5\' 8" )     Head Circumference --      Peak Flow --      Pain Score 07/20/19 0018 7     Pain Loc --      Pain Edu? --      Excl. in [place]? --     Constitutional: Alert and oriented.  No acute distress at this time.  Asymptomatic, no residual pain or discomfort. Eyes: Conjunctivae are normal.  Head: Atraumatic. Nose: No congestion/rhinnorhea. Mouth/Throat: Patient is wearing a mask. Neck: No stridor.  No meningeal signs.   Cardiovascular: Normal rate, regular rhythm. Good peripheral circulation. Grossly normal heart sounds. Respiratory: Normal respiratory effort.  No  retractions. Gastrointestinal: Soft and nontender. No distention.  Musculoskeletal: No lower extremity tenderness nor edema. No gross deformities of extremities. Neurologic:  Normal speech and language. No gross focal neurologic deficits are appreciated.  Skin:  Skin is warm, dry and intact. Psychiatric: Mood and affect are normal. Speech and behavior are normal.  ____________________________________________   LABS (all labs ordered are listed, but only abnormal results are displayed)  Labs Reviewed  COMPREHENSIVE METABOLIC PANEL - Abnormal; Notable for the following components:      Result Value   Glucose, Bld 141 (*)    Creatinine, Ser 1.52 (*)    GFR calc non Af Amer 45 (*)    GFR calc Af Amer 52 (*)    All other components within normal limits  CBC - Abnormal; Notable for the following components:   RBC 3.89 (*)    Hemoglobin 11.7 (*)    HCT 35.0 (*)    All other components within  normal limits  URINALYSIS, COMPLETE (UACMP) WITH MICROSCOPIC - Abnormal; Notable for the following components:   Color, Urine STRAW (*)    APPearance CLEAR (*)    Leukocytes,Ua TRACE (*)    All other components within normal limits  TROPONIN I (HIGH SENSITIVITY) - Abnormal; Notable for the following components:   Troponin I (High Sensitivity) 22 (*)    All other components within normal limits  TROPONIN I (HIGH SENSITIVITY) - Abnormal; Notable for the following components:   Troponin I (High Sensitivity) 20 (*)    All other components within normal limits  URINE CULTURE  LIPASE, BLOOD   ____________________________________________  EKG  ED ECG REPORT I, Hinda Kehr, the attending physician, personally viewed and interpreted this ECG.  Date: 07/20/2019 EKG Time: 00: 22 Rate: 57 Rhythm: Sinus bradycardia with first-degree AV block QRS Axis: normal Intervals: PR interval is 216 ms ST/T Wave abnormalities: normal Narrative Interpretation: no evidence of acute  ischemia  ____________________________________________  RADIOLOGY I, Hinda Kehr, personally viewed and evaluated these images (plain radiographs) as part of my medical decision making, as well as reviewing the written report by the radiologist.  ED MD interpretation: No acute abnormalities on chest x-ray  Official radiology report(s): DG Chest 2 View  Result Date: 07/20/2019 CLINICAL DATA:  Chest pain EXAM: CHEST - 2 VIEW COMPARISON:  None. FINDINGS: The heart size and mediastinal contours are within normal limits. Both lungs are clear. The visualized skeletal structures are unremarkable. Remote median sternotomy. IMPRESSION: No active cardiopulmonary disease. Electronically Signed   By: Ulyses Jarred M.D.   On: 07/20/2019 01:08   CT Renal Stone Study  Result Date: 07/20/2019 CLINICAL DATA:  Flank pain EXAM: CT ABDOMEN AND PELVIS WITHOUT CONTRAST TECHNIQUE: Multidetector CT imaging of the abdomen and pelvis was performed following the standard protocol without IV contrast. COMPARISON:  None. FINDINGS: Lower chest: There is atelectasis at the left lung base.The heart is enlarged. Hepatobiliary: There is decreased hepatic attenuation suggestive of hepatic steatosis. Normal gallbladder.There is no biliary ductal dilation. Pancreas: Normal contours without ductal dilatation. No peripancreatic fluid collection. Spleen: No splenic laceration or hematoma. Adrenals/Urinary Tract: --Adrenal glands: No adrenal hemorrhage. --Right kidney/ureter: There are punctate nonobstructing stones versus vascular calcifications involving the right kidney. There is no right-sided hydronephrosis. --Left kidney/ureter: There are vascular calcifications involving the left kidney. There is no left-sided hydronephrosis. --Urinary bladder: Unremarkable. Stomach/Bowel: --Stomach/Duodenum: There is a small hiatal hernia. --Small bowel: No dilatation or inflammation. --Colon: Rectosigmoid diverticulosis without acute inflammation.  --Appendix: Normal. Vascular/Lymphatic: Atherosclerotic calcification is present within the non-aneurysmal abdominal aorta, without hemodynamically significant stenosis. --No retroperitoneal lymphadenopathy. --No mesenteric lymphadenopathy. --No pelvic or inguinal lymphadenopathy. Reproductive: Unremarkable Other: No ascites or free air. The abdominal wall is normal. Musculoskeletal. 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 There is no significant anterolisthesis. There is no displaced fracture. IMPRESSION: 1. No specific abnormality detected to explain the patient's pain. 2. There are punctate nonobstructing stones versus vascular calcifications involving the right kidney. No hydronephrosis. 3. Rectosigmoid diverticulosis without diverticulitis. 4. Hepatic steatosis. 5. Aortic Atherosclerosis (ICD10-I70.0). Electronically Signed   By: Constance Holster M.D.   On: 07/20/2019 03:29    ____________________________________________   PROCEDURES   Procedure(s) performed (including Critical Care):  Procedures   ____________________________________________   INITIAL IMPRESSION / MDM / [place] / ED COURSE  As part of my medical decision making, I reviewed the following data within the [place] notes reviewed and incorporated, Labs reviewed , EKG  interpreted , Old chart reviewed, Radiograph reviewed , Notes from prior ED visits and [place] Controlled Substance Database   Differential diagnosis includes, but is not limited to, musculoskeletal pain, reflux, renal colic, UTI/pyelonephritis, ACS, pulmonary embolism, pneumonia, COVID-19.  Patient symptoms have resolved completely.  Patient has some chronic kidney disease and creatinine is essentially the same as prior.  Initial troponin is 22 but it is difficult to appreciate if this is secondary to his chronic kidney disease or representative of ACS.  The pain was primarily in his flank and I have ordered a renal stone  protocol CT scan to make sure he does not have any sign of kidney stone or ureteral obstruction.  Urinalysis shows a few white cells but otherwise is normal.  Patient's vital signs are stable.  LFTs and lipase are normal.  No leukocytosis.  I will reassess after second troponin and renal stone protocol CT scan.  He is followed closely by Dr. Clayborn Bigness and anticipate he may be appropriate for discharge and cardiology follow-up of his high-sensitivity troponin is essentially unchanged.  He agrees with this plan and would prefer not to stay in the hospital if it is not necessary.      Clinical Course as of Jul 20 355  Wed Jul 20, 2019  0354 No emergent abnormalities on CT scan.  Patient continues to be asymptomatic.  I discussed with him the white blood cells on his urine but he would prefer to hold off on antibiotics at this time since he is having no urinary symptoms and I think that is appropriate.  I have ordered a urine culture.  He is comfortable with the plan for discharge and outpatient follow-up with Dr. Clayborn Bigness and his repeat troponin was 20 which is reassuring in the setting of his chronic kidney disease.  He will call Dr. Clayborn Bigness in the morning and I gave my usual and customary return precautions.  CT Renal Laren Everts [CF]    Clinical Course User Index [CF] Hinda Kehr, MD     ____________________________________________  FINAL CLINICAL IMPRESSION(S) / ED DIAGNOSES  Final diagnoses:  Left sided abdominal pain  Flank pain  Left-sided chest pain  Chronic kidney disease, unspecified CKD stage  Elevated troponin level     MEDICATIONS GIVEN DURING THIS VISIT:  Medications - No data to display   ED Discharge Orders    None      *Please note:  RONNAL STOOPS was evaluated in Emergency Department on 07/20/2019 for the symptoms described in the history of present illness. He was evaluated in the context of the global COVID-19 pandemic, which necessitated consideration  that the patient might be at risk for infection with the SARS-CoV-2 virus that causes COVID-19. Institutional protocols and algorithms that pertain to the evaluation of patients at risk for COVID-19 are in a state of rapid change based on information released by regulatory bodies including the CDC and federal and state organizations. These policies and algorithms were followed during the patient's care in the ED.  Some ED evaluations and interventions may be delayed as a result of limited staffing during the pandemic.*  Note:  This document was prepared using Dragon voice recognition software and may include unintentional dictation errors.   Hinda Kehr, MD 07/20/19 [phone]

## 2019-07-20 NOTE — ED Triage Notes (Signed)
FIRST NURSE NOTE:   Pt arrived via EMS from home with reports of abdominal pain and L & R flank pain that started around 6pm today.  Pt has hx of the same pain over the summer and was seen here and told to follow up but never did. Had 2nd COVID vaccine today.  BP 180/90 P-60s, CBG 176, Temp 98.3  12 Lead neg  18G in L AC

## 2019-07-20 NOTE — Discharge Instructions (Signed)

## 2019-07-20 NOTE — ED Notes (Signed)
Pt taken to CT.

## 2019-07-21 LAB — URINE CULTURE
Culture: NO GROWTH
Special Requests: NORMAL

## 2019-07-24 ENCOUNTER — Emergency Department: Payer: Medicare HMO

## 2019-07-24 ENCOUNTER — Emergency Department
Admission: EM | Admit: 2019-07-24 | Discharge: 2019-07-24 | Disposition: A | Discharge status: Home / Self Care | Payer: Medicare HMO | Attending: Emergency Medicine | Admitting: Emergency Medicine

## 2019-07-24 ENCOUNTER — Other Ambulatory Visit: Payer: Self-pay

## 2019-07-24 DIAGNOSIS — R079 Chest pain, unspecified: Secondary | ICD-10-CM

## 2019-07-24 DIAGNOSIS — F17220 Nicotine dependence, chewing tobacco, uncomplicated: Secondary | ICD-10-CM | POA: Diagnosis not present

## 2019-07-24 DIAGNOSIS — Z7984 Long term (current) use of oral hypoglycemic drugs: Secondary | ICD-10-CM | POA: Diagnosis not present

## 2019-07-24 DIAGNOSIS — Z79899 Other long term (current) drug therapy: Secondary | ICD-10-CM | POA: Insufficient documentation

## 2019-07-24 DIAGNOSIS — Z7982 Long term (current) use of aspirin: Secondary | ICD-10-CM | POA: Diagnosis not present

## 2019-07-24 DIAGNOSIS — I2510 Atherosclerotic heart disease of native coronary artery without angina pectoris: Secondary | ICD-10-CM | POA: Insufficient documentation

## 2019-07-24 DIAGNOSIS — I129 Hypertensive chronic kidney disease with stage 1 through stage 4 chronic kidney disease, or unspecified chronic kidney disease: Secondary | ICD-10-CM | POA: Diagnosis not present

## 2019-07-24 DIAGNOSIS — N189 Chronic kidney disease, unspecified: Secondary | ICD-10-CM | POA: Insufficient documentation

## 2019-07-24 DIAGNOSIS — E1122 Type 2 diabetes mellitus with diabetic chronic kidney disease: Secondary | ICD-10-CM | POA: Insufficient documentation

## 2019-07-24 LAB — TROPONIN I (HIGH SENSITIVITY)
Troponin I (High Sensitivity): 17 ng/L (ref <18)
Troponin I (High Sensitivity): 16 ng/L (ref <18)

## 2019-07-24 LAB — CBC
HCT: 34.8 % — ABNORMAL LOW (ref 39.0–52.0)
Hemoglobin: 11.4 g/dL — ABNORMAL LOW (ref 13.0–17.0)
MCH: 29.7 pg (ref 26.0–34.0)
MCHC: 32.8 g/dL (ref 30.0–36.0)
MCV: 90.6 fL (ref 80.0–100.0)
Platelets: 320 10*3/uL (ref 150–400)
RBC: 3.84 MIL/uL — ABNORMAL LOW (ref 4.22–5.81)
RDW: 14 % (ref 11.5–15.5)
WBC: 9.5 10*3/uL (ref 4.0–10.5)
nRBC: 0 % (ref 0.0–0.2)

## 2019-07-24 LAB — BASIC METABOLIC PANEL
Anion gap: 8 (ref 5–15)
BUN: 19 mg/dL (ref 8–23)
CO2: 23 mmol/L (ref 22–32)
Calcium: 9.2 mg/dL (ref 8.9–10.3)
Chloride: 106 mmol/L (ref 98–111)
Creatinine, Ser: 1.33 mg/dL — ABNORMAL HIGH (ref 0.61–1.24)
GFR calc Af Amer: >60 mL/min (ref >60)
GFR calc non Af Amer: 53 mL/min — ABNORMAL LOW (ref >60)
Glucose, Bld: 158 mg/dL — ABNORMAL HIGH (ref 70–99)
Potassium: 3.7 mmol/L (ref 3.5–5.1)
Sodium: 137 mmol/L (ref 135–145)

## 2019-07-24 MED ORDER — NITROGLYCERIN 0.4 MG SL SUBL: 0.4000 mg | SUBLINGUAL_TABLET | Freq: Once | SUBLINGUAL | Status: DC

## 2019-07-24 NOTE — ED Provider Notes (Signed)
[facility] Emergency Department Provider Note  ____________________________________________   First MD Initiated Contact with Patient 07/24/19 [phone]     (approximate)  I have reviewed the triage vital signs and the nursing notes.   HISTORY  Chief Complaint Chest Pain   HPI Justin Keith is a 74 y.o. male with below list of previous medical conditions including chronic kidney disease CAD status post CABG in 2005 presents to the emergency department via EMS secondary to left-sided chest pain intermittently with onset yesterday.  Patient denies any dyspnea no lower extremity pain or swelling.  Patient denies any diaphoresis.  Of note the patient does admit to multiple life stressors secondary to the death of multiple friends.  EMS states that this is the patient's fourth call secondary to chest pain.  Patient was given aspirin 3 and 24 mg and 1 sublingual nitroglycerin with current pain score of 0        Past Medical History:  Diagnosis Date  . CAD (coronary artery disease)   . CKD (chronic kidney disease)   . Diabetes mellitus without complication ([place])   . GERD (gastroesophageal reflux disease)   . Hyperlipemia   . Hypertension     Patient Active Problem List   Diagnosis Date Noted  . Chest pain with high risk of acute coronary syndrome 03/15/2019  . Problems with swallowing and mastication   . Chest pain in adult 10/22/2018  . HTN (hypertension) 09/15/2018  . HLD (hyperlipidemia) 09/15/2018  . GERD (gastroesophageal reflux disease) 09/15/2018  . CAD (coronary artery disease) 09/15/2018  . Diabetes ([place]) 09/15/2018  . Chest pain 12/07/2015    Past Surgical History:  Procedure Laterality Date  . CARDIAC CATHETERIZATION    . CARDIAC SURGERY    . COLONOSCOPY WITH PROPOFOL N/A 02/25/2018   Procedure: COLONOSCOPY WITH PROPOFOL;  Surgeon: Jonathon Bellows, MD;  Location: [facility] ENDOSCOPY;  Service: Gastroenterology;  Laterality: N/A;  . COLONOSCOPY  WITH PROPOFOL N/A 02/26/2018   Procedure: COLONOSCOPY WITH PROPOFOL;  Surgeon: Jonathon Bellows, MD;  Location: [facility] ENDOSCOPY;  Service: Gastroenterology;  Laterality: N/A;  . ESOPHAGOGASTRODUODENOSCOPY (EGD) WITH PROPOFOL N/A 01/18/2019   Procedure: ESOPHAGOGASTRODUODENOSCOPY (EGD) WITH PROPOFOL;  Surgeon: Lucilla Lame, MD;  Location: [facility] ENDOSCOPY;  Service: Endoscopy;  Laterality: N/A;  . FOREIGN BODY REMOVAL N/A 03/07/2018   Procedure: FOREIGN BODY REMOVAL;  Surgeon: Jonathon Bellows, MD;  Location: [facility] ENDOSCOPY;  Service: Gastroenterology;  Laterality: N/A;  . LEFT HEART CATH AND CORS/GRAFTS ANGIOGRAPHY N/A 08/07/2017   Procedure: LEFT HEART CATH AND CORS/GRAFTS ANGIOGRAPHY;  Surgeon: Yolonda Kida, MD;  Location: [place] CV LAB;  Service: Cardiovascular;  Laterality: N/A;  . PROSTATE SURGERY    . SHOULDER ARTHROSCOPY      Prior to Admission medications   Medication Sig Start Date End Date Taking? Authorizing Provider  amLODipine (NORVASC) 10 MG tablet Take 1 tablet (10 mg total) by mouth daily. 12/08/15   Vaughan Basta, MD  aspirin 81 MG chewable tablet Chew 1 tablet (81 mg total) by mouth daily. 12/08/15   Vaughan Basta, MD  atorvastatin (LIPITOR) 80 MG tablet Take 1 tablet by mouth daily.    [provider]  lisinopril (PRINIVIL,ZESTRIL) 10 MG tablet Take 1 tablet (10 mg total) by mouth daily. 12/08/15   Vaughan Basta, MD  metFORMIN (GLUCOPHAGE-XR) 500 MG 24 hr tablet Take 1,000 mg by mouth daily with breakfast.     [provider]  nitroGLYCERIN (NITROSTAT) 0.4 MG SL tablet Place 0.4 mg under the  tongue every 5 (five) minutes as needed for chest pain.    [provider]  pantoprazole (PROTONIX) 40 MG tablet Take 1 tablet (40 mg total) by mouth daily. 01/18/19 01/18/20  Lucilla Lame, MD  pantoprazole (PROTONIX) 40 MG tablet Take 40 mg by mouth daily.    [provider]  ranitidine (ZANTAC) 150 MG tablet Take 150 mg by mouth 2 (two)  times daily.     [provider]    Allergies Patient has no known allergies.  No family history on file.  Social History Social History   Tobacco Use  . Smoking status: Never Smoker  . Smokeless tobacco: Current User    Types: Chew  Substance Use Topics  . Alcohol use: No  . Drug use: Not Currently    Review of Systems Constitutional: No fever/chills Eyes: No visual changes. ENT: No sore throat. Cardiovascular: Positive for chest pain. Respiratory: Denies shortness of breath. Gastrointestinal: No abdominal pain.  No nausea, no vomiting.  No diarrhea.  No constipation. Genitourinary: Negative for dysuria. Musculoskeletal: Negative for neck pain.  Negative for back pain. Integumentary: Negative for rash. Neurological: Negative for headaches, focal weakness or numbness.   ____________________________________________   PHYSICAL EXAM:  VITAL SIGNS: ED Triage Vitals  Enc Vitals Group     BP 07/24/19 0324 (!) 160/88     Pulse Rate 07/24/19 0320 65     Resp 07/24/19 0320 16     Temp 07/24/19 0323 98.3 F (36.8 C)     Temp Source 07/24/19 0320 Oral     SpO2 07/24/19 0320 98 %     Weight 07/24/19 0323 120.2 kg (265 lb)     Height 07/24/19 0323 1.727 m (5\' 8" )     Head Circumference --      Peak Flow --      Pain Score 07/24/19 0323 6     Pain Loc --      Pain Edu? --      Excl. in [place]? --     Constitutional: Alert and oriented.  Eyes: Conjunctivae are normal.  Mouth/Throat: Patient is wearing a mask. Neck: No stridor.  No meningeal signs.   Cardiovascular: Normal rate, regular rhythm. Good peripheral circulation. Grossly normal heart sounds. Respiratory: Normal respiratory effort.  No retractions. Gastrointestinal: Soft and nontender. No distention.  Musculoskeletal: No lower extremity tenderness nor edema. No gross deformities of extremities. Neurologic:  Normal speech and language. No gross focal neurologic deficits are appreciated.  Skin:  Skin is  warm, dry and intact. Psychiatric: Mood and affect are normal. Speech and behavior are normal.  ____________________________________________   LABS (all labs ordered are listed, but only abnormal results are displayed)  Labs Reviewed  CBC - Abnormal; Notable for the following components:      Result Value   RBC 3.84 (*)    Hemoglobin 11.4 (*)    HCT 34.8 (*)    All other components within normal limits  BASIC METABOLIC PANEL  TROPONIN I (HIGH SENSITIVITY)   ____________________________________________  EKG  ED ECG REPORT I, [place] N Carr Shartzer, the attending physician, personally viewed and interpreted this ECG.   Date: 07/24/2019  EKG Time: 3:22 AM  Rate: 60  Rhythm: Normal sinus rhythm  Axis: Normal  Intervals: Normal  ST&T Change: None  ____________________________________________  RADIOLOGY I, [place] N Abrie Egloff, personally viewed and evaluated these images (plain radiographs) as part of my medical decision making, as well as reviewing the written report by the radiologist.  ED  MD interpretation: No active disease noted on chest x-ray per radiologist.  Official radiology report(s): DG Chest Port 1 View  Result Date: 07/24/2019 CLINICAL DATA:  Chest pain. EXAM: PORTABLE CHEST 1 VIEW COMPARISON:  07/20/2019 FINDINGS: The heart size is stable from prior study. The patient is status post prior median sternotomy. There is no definite acute cardiopulmonary process. No pneumothorax. There may be atelectasis at the lung bases. IMPRESSION: No active disease. Electronically Signed   By: Constance Holster M.D.   On: 07/24/2019 03:47      Procedures   ____________________________________________   INITIAL IMPRESSION / MDM / [place] / ED COURSE  As part of my medical decision making, I reviewed the following data within the [place] NUMBER   74 year old male presented with above-stated history and physical exam with differential diagnosis including  but not limited to ACS,Angina, PE.  EKG revealed no evidence of ischemia or infarction.  Laboratory data including troponin x2 -.  Patient is currently pain-free resting comfortably while in the emergency department.  Advised patient follow-up with Dr. Clayborn Bigness cardiology for further outpatient evaluation.   ____________________________________________  FINAL CLINICAL IMPRESSION(S) / ED DIAGNOSES  Final diagnoses:  Nonspecific chest pain     MEDICATIONS GIVEN DURING THIS VISIT:  Medications  nitroGLYCERIN (NITROSTAT) SL tablet 0.4 mg (has no administration in time range)     ED Discharge Orders    None      *Please note:  KAISHAWN RIQUELME was evaluated in Emergency Department on 07/24/2019 for the symptoms described in the history of present illness. He was evaluated in the context of the global COVID-19 pandemic, which necessitated consideration that the patient might be at risk for infection with the SARS-CoV-2 virus that causes COVID-19. Institutional protocols and algorithms that pertain to the evaluation of patients at risk for COVID-19 are in a state of rapid change based on information released by regulatory bodies including the CDC and federal and state organizations. These policies and algorithms were followed during the patient's care in the ED.  Some ED evaluations and interventions may be delayed as a result of limited staffing during the pandemic.*  Note:  This document was prepared using Dragon voice recognition software and may include unintentional dictation errors.   Gregor Hams, MD 07/24/19 [phone]

## 2019-07-24 NOTE — ED Triage Notes (Signed)
Pt with chest pain radiating down to abd. md at bedside, pt took ntg x1 and 324mg  asa pta.

## 2019-09-23 IMAGING — CR DG CHEST 2V
1 series · 2 of 2 positions shown · non-contrast
Comparison: 01/25/2017

CLINICAL DATA: Chest pain

EXAM:
CHEST  2 VIEW

[Series 1: dg chest 2 view · 0.14mm/px · 2 of 2 slices shown]
[im 1/2]
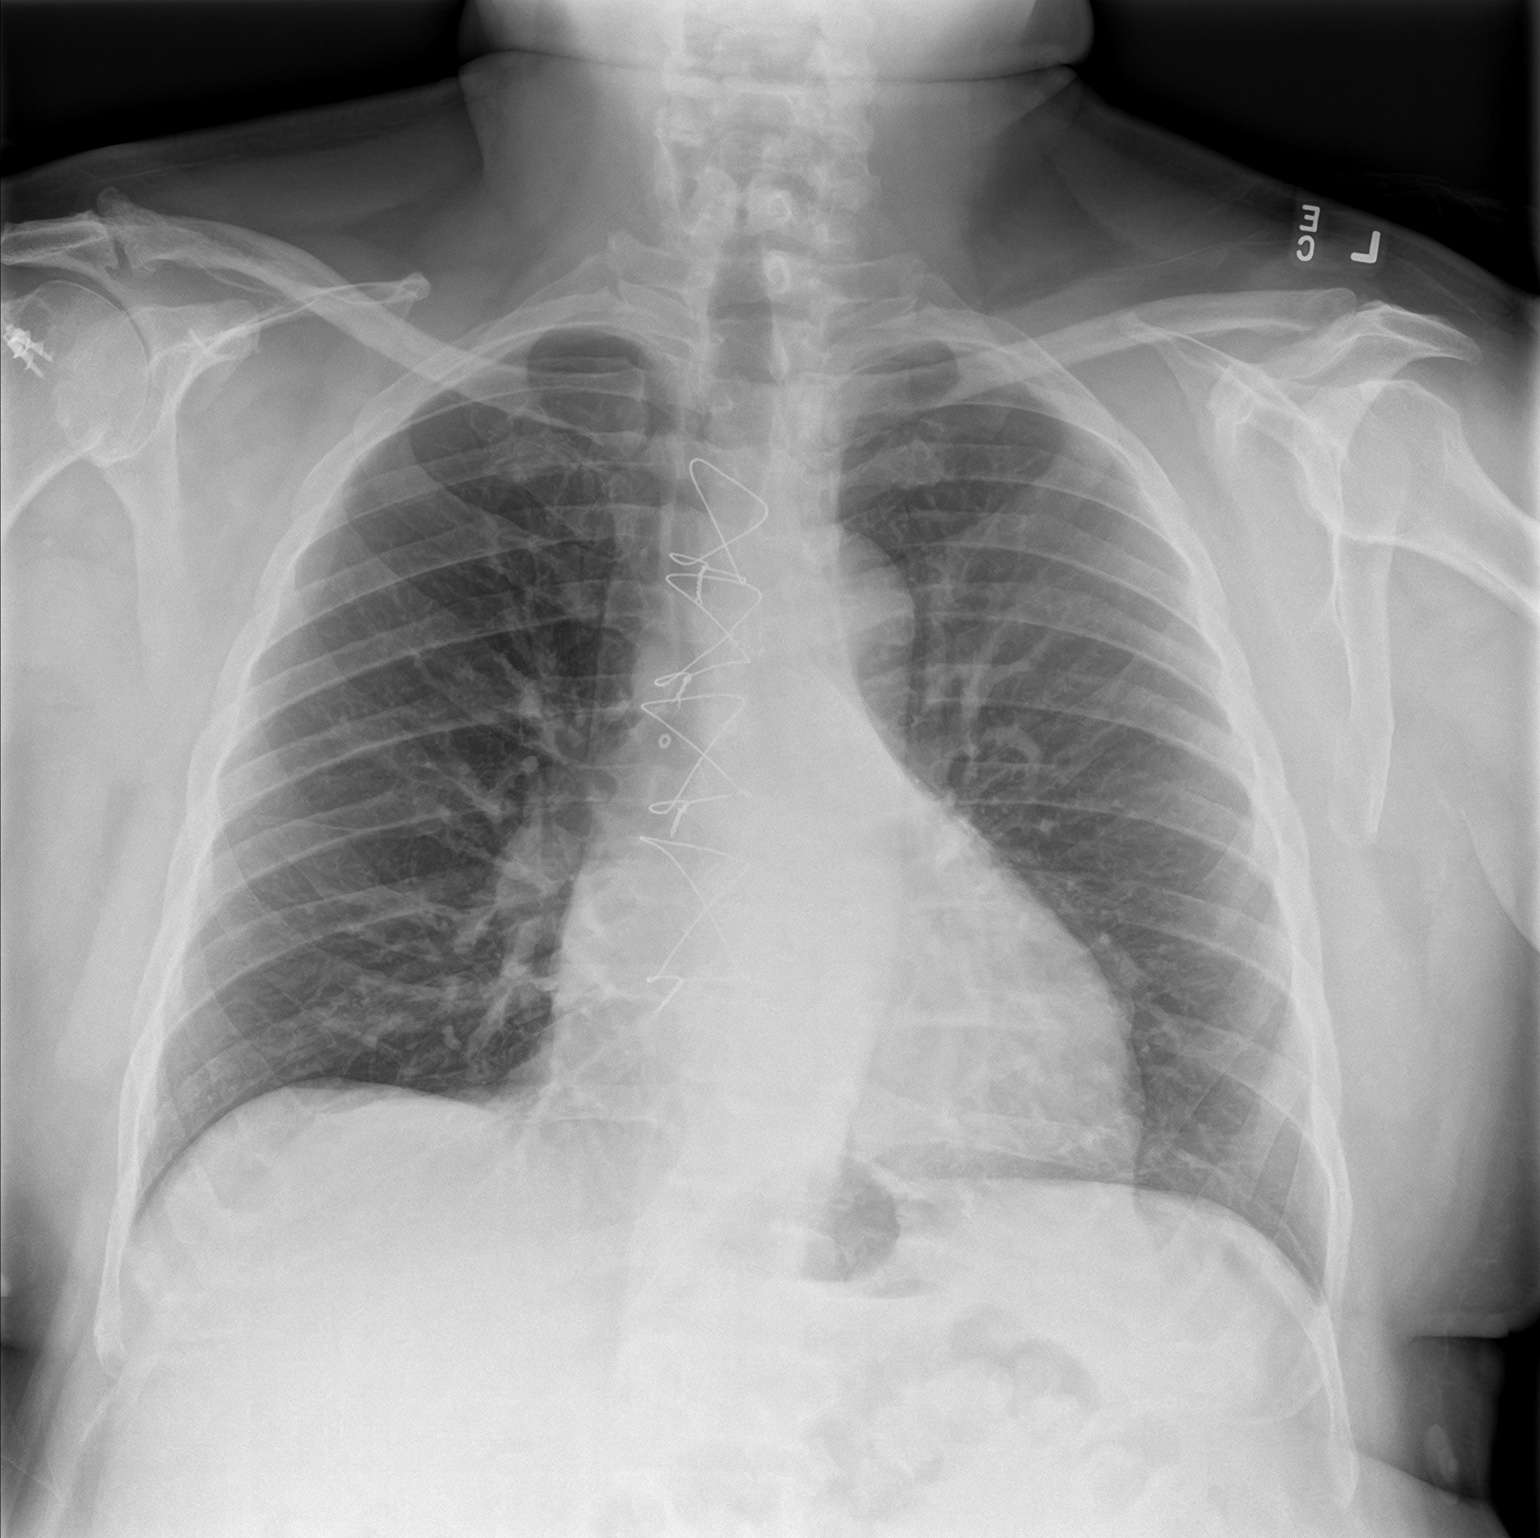
[im 2/2]
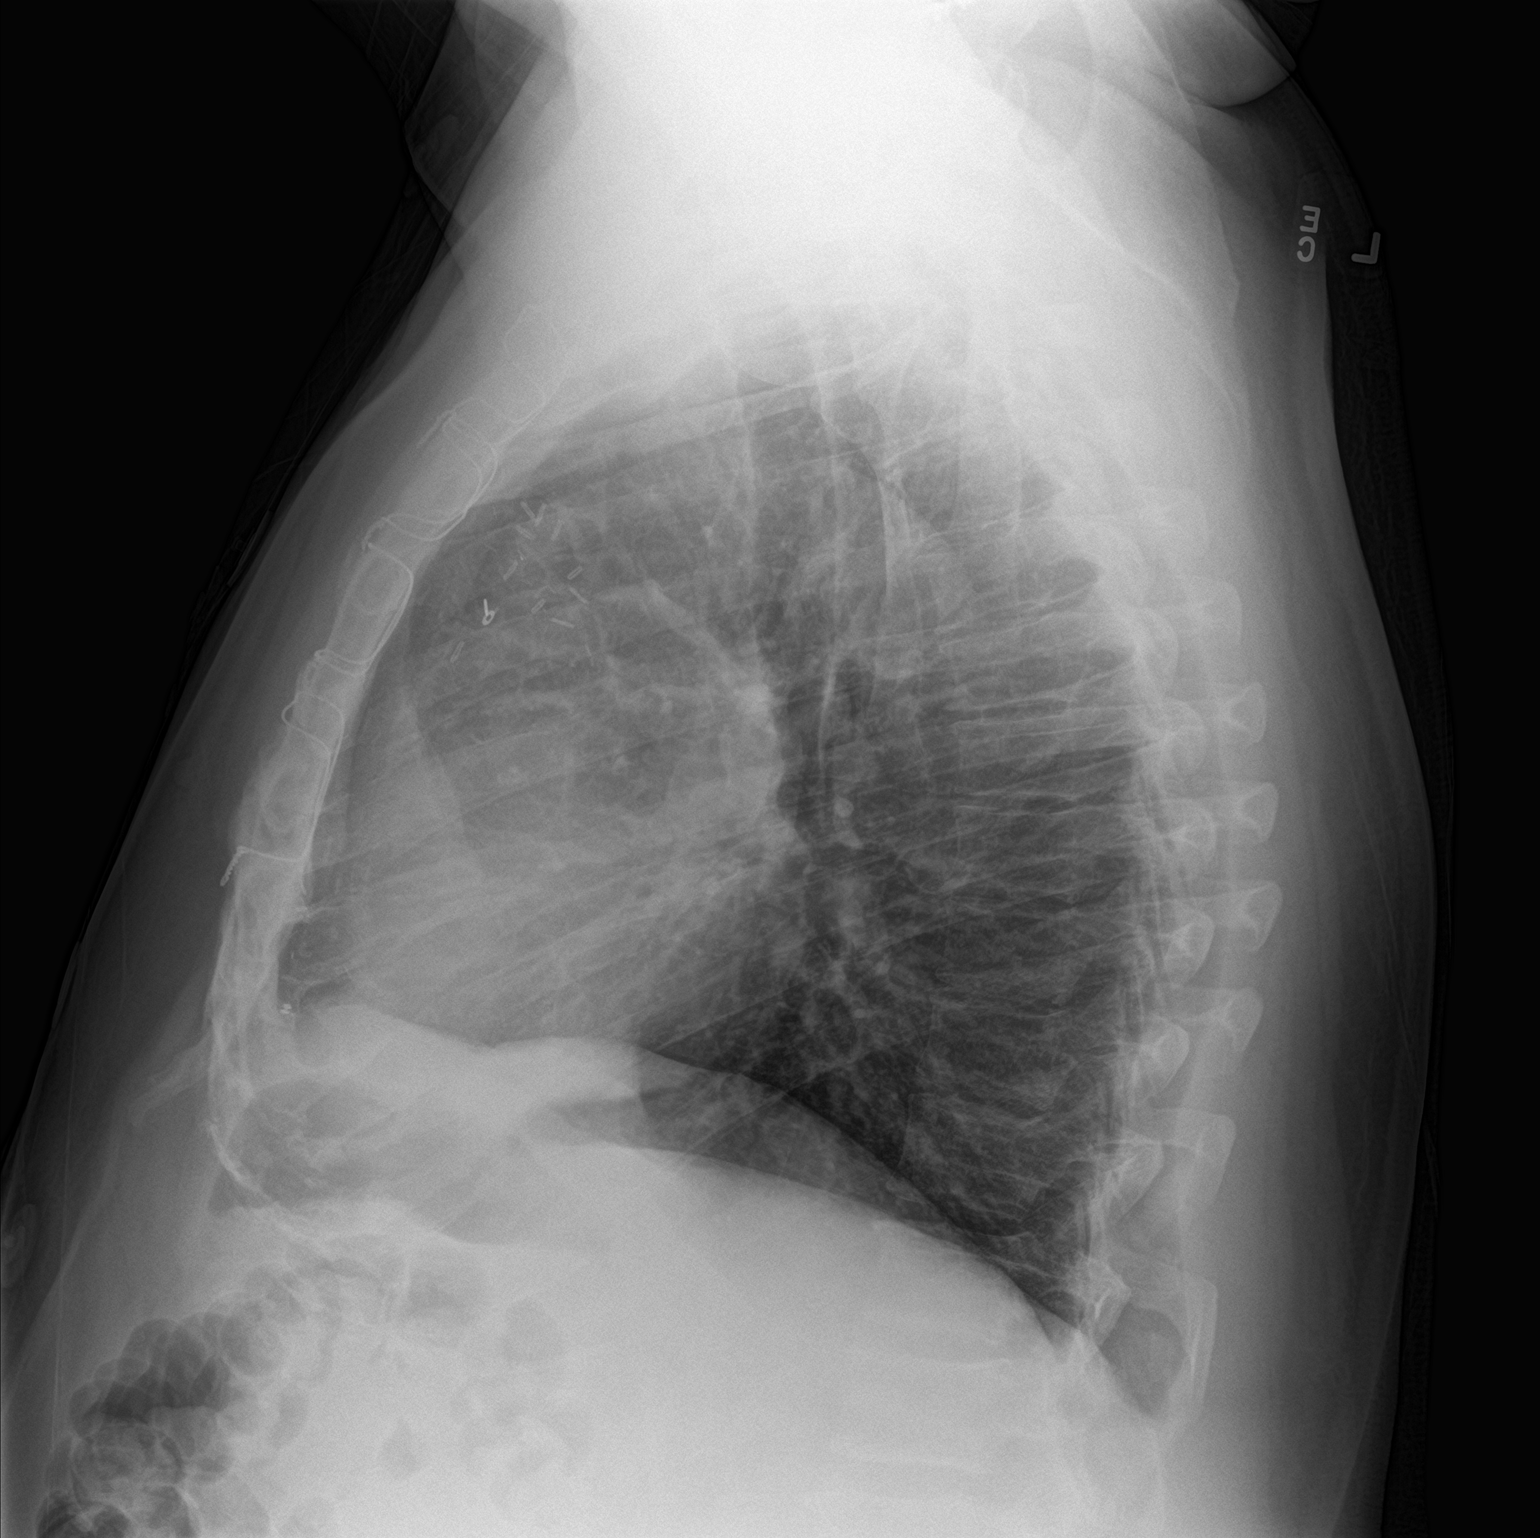

[2 of 2 positions shown; findings below may reference images not displayed]

FINDINGS: Post sternotomy changes. No focal pulmonary infiltrate or effusion.
Stable cardiomediastinal silhouette. No pneumothorax. Postsurgical
changes in the right humeral head.
IMPRESSION: No active cardiopulmonary disease.

## 2019-11-18 ENCOUNTER — Emergency Department: Payer: Medicare Other

## 2019-11-18 ENCOUNTER — Emergency Department
Admission: EM | Admit: 2019-11-18 | Discharge: 2019-11-18 | Disposition: A | Discharge status: Home / Self Care | Payer: Medicare Other | Attending: Emergency Medicine | Admitting: Emergency Medicine

## 2019-11-18 ENCOUNTER — Encounter: Payer: Self-pay | Admitting: Emergency Medicine

## 2019-11-18 ENCOUNTER — Other Ambulatory Visit: Payer: Self-pay

## 2019-11-18 DIAGNOSIS — Z7984 Long term (current) use of oral hypoglycemic drugs: Secondary | ICD-10-CM | POA: Insufficient documentation

## 2019-11-18 DIAGNOSIS — E1165 Type 2 diabetes mellitus with hyperglycemia: Secondary | ICD-10-CM | POA: Insufficient documentation

## 2019-11-18 DIAGNOSIS — I2510 Atherosclerotic heart disease of native coronary artery without angina pectoris: Secondary | ICD-10-CM | POA: Diagnosis not present

## 2019-11-18 DIAGNOSIS — R739 Hyperglycemia, unspecified: Secondary | ICD-10-CM

## 2019-11-18 DIAGNOSIS — F17220 Nicotine dependence, chewing tobacco, uncomplicated: Secondary | ICD-10-CM | POA: Diagnosis not present

## 2019-11-18 DIAGNOSIS — N189 Chronic kidney disease, unspecified: Secondary | ICD-10-CM | POA: Insufficient documentation

## 2019-11-18 DIAGNOSIS — I129 Hypertensive chronic kidney disease with stage 1 through stage 4 chronic kidney disease, or unspecified chronic kidney disease: Secondary | ICD-10-CM | POA: Insufficient documentation

## 2019-11-18 DIAGNOSIS — Z79899 Other long term (current) drug therapy: Secondary | ICD-10-CM | POA: Insufficient documentation

## 2019-11-18 LAB — URINALYSIS, COMPLETE (UACMP) WITH MICROSCOPIC
Bacteria, UA: NONE SEEN
Bilirubin Urine: NEGATIVE
Glucose, UA: >=500 mg/dL — AB
Hgb urine dipstick: NEGATIVE
Ketones, ur: NEGATIVE mg/dL
Leukocytes,Ua: NEGATIVE
Nitrite: NEGATIVE
Protein, ur: NEGATIVE mg/dL
Specific Gravity, Urine: 1.016 (ref 1.005–1.030)
pH: 5 (ref 5.0–8.0)

## 2019-11-18 LAB — CBC
HCT: 33.5 % — ABNORMAL LOW (ref 39.0–52.0)
Hemoglobin: 11.8 g/dL — ABNORMAL LOW (ref 13.0–17.0)
MCH: 29 pg (ref 26.0–34.0)
MCHC: 35.2 g/dL (ref 30.0–36.0)
MCV: 82.3 fL (ref 80.0–100.0)
Platelets: 285 10*3/uL (ref 150–400)
RBC: 4.07 MIL/uL — ABNORMAL LOW (ref 4.22–5.81)
RDW: 13.4 % (ref 11.5–15.5)
WBC: 9.1 10*3/uL (ref 4.0–10.5)
nRBC: 0 % (ref 0.0–0.2)

## 2019-11-18 LAB — GLUCOSE, CAPILLARY
Glucose-Capillary: 416 mg/dL — ABNORMAL HIGH (ref 70–99)
Glucose-Capillary: 375 mg/dL — ABNORMAL HIGH (ref 70–99)
Glucose-Capillary: 379 mg/dL — ABNORMAL HIGH (ref 70–99)
Glucose-Capillary: 256 mg/dL — ABNORMAL HIGH (ref 70–99)

## 2019-11-18 LAB — BASIC METABOLIC PANEL
Anion gap: 10 (ref 5–15)
BUN: 41 mg/dL — ABNORMAL HIGH (ref 8–23)
CO2: 20 mmol/L — ABNORMAL LOW (ref 22–32)
Calcium: 9.7 mg/dL (ref 8.9–10.3)
Chloride: 97 mmol/L — ABNORMAL LOW (ref 98–111)
Creatinine, Ser: 2.16 mg/dL — ABNORMAL HIGH (ref 0.61–1.24)
GFR calc Af Amer: 34 mL/min — ABNORMAL LOW (ref >60)
GFR calc non Af Amer: 29 mL/min — ABNORMAL LOW (ref >60)
Glucose, Bld: 463 mg/dL — ABNORMAL HIGH (ref 70–99)
Potassium: 4.9 mmol/L (ref 3.5–5.1)
Sodium: 127 mmol/L — ABNORMAL LOW (ref 135–145)

## 2019-11-18 LAB — TROPONIN I (HIGH SENSITIVITY)
Troponin I (High Sensitivity): 16 ng/L (ref <18)
Troponin I (High Sensitivity): 13 ng/L (ref <18)

## 2019-11-18 MED ORDER — SODIUM CHLORIDE 0.9 % IV BOLUS: 1000.0000 mL | Freq: Once | INTRAVENOUS | Status: DC

## 2019-11-18 MED ORDER — SODIUM CHLORIDE 0.9 % IV BOLUS
1000.0000 mL | Freq: Once | INTRAVENOUS | Status: AC
  Administered 2019-11-18: 1000 mL via INTRAVENOUS

## 2019-11-18 NOTE — ED Provider Notes (Signed)
[facility] Emergency Department Provider Note  ____________________________________________   First MD Initiated Contact with Patient 11/18/19 1355     (approximate)  I have reviewed the triage vital signs and the nursing notes.   HISTORY  Chief Complaint Hyperglycemia and Chest Pain    HPI Justin Keith is a 74 y.o. male with past medical history as below here with hyperglycemia. Pt reports that he is here to "check up" on his blood sugar. He states he just had his PCP appointment yesterday, was told his sugar was high but didn't know what to do with it. Since then, he checked his BG at home and it was in the 500s so he came for evaluation. He admits he has not been taking his meds as he should, and has been eating poorly. Denies any other complaints. Specifically, no recent fever, chills, or illness. No CP, SOB, abd pain, n/v/d. No change in urine sx.        Past Medical History:  Diagnosis Date  . CAD (coronary artery disease)   . CKD (chronic kidney disease)   . Diabetes mellitus without complication ([place])   . GERD (gastroesophageal reflux disease)   . Hyperlipemia   . Hypertension     Patient Active Problem List   Diagnosis Date Noted  . Chest pain with high risk of acute coronary syndrome 03/15/2019  . Problems with swallowing and mastication   . Chest pain in adult 10/22/2018  . HTN (hypertension) 09/15/2018  . HLD (hyperlipidemia) 09/15/2018  . GERD (gastroesophageal reflux disease) 09/15/2018  . CAD (coronary artery disease) 09/15/2018  . Diabetes ([place]) 09/15/2018  . Chest pain 12/07/2015    Past Surgical History:  Procedure Laterality Date  . CARDIAC CATHETERIZATION    . CARDIAC SURGERY    . COLONOSCOPY WITH PROPOFOL N/A 02/25/2018   Procedure: COLONOSCOPY WITH PROPOFOL;  Surgeon: Jonathon Bellows, MD;  Location: [facility] ENDOSCOPY;  Service: Gastroenterology;  Laterality: N/A;  . COLONOSCOPY WITH PROPOFOL N/A 02/26/2018   Procedure:  COLONOSCOPY WITH PROPOFOL;  Surgeon: Jonathon Bellows, MD;  Location: [facility] ENDOSCOPY;  Service: Gastroenterology;  Laterality: N/A;  . ESOPHAGOGASTRODUODENOSCOPY (EGD) WITH PROPOFOL N/A 01/18/2019   Procedure: ESOPHAGOGASTRODUODENOSCOPY (EGD) WITH PROPOFOL;  Surgeon: Lucilla Lame, MD;  Location: [facility] ENDOSCOPY;  Service: Endoscopy;  Laterality: N/A;  . FOREIGN BODY REMOVAL N/A 03/07/2018   Procedure: FOREIGN BODY REMOVAL;  Surgeon: Jonathon Bellows, MD;  Location[facility] ENDOSCOPY;  Service: Gastroenterology;  Laterality: N/A;  . LEFT HEART CATH AND CORS/GRAFTS ANGIOGRAPHY N/A 08/07/2017   Procedure: LEFT HEART CATH AND CORS/GRAFTS ANGIOGRAPHY;  Surgeon: Yolonda Kida, MD;  Location: [place] CV LAB;  Service: Cardiovascular;  Laterality: N/A;  . PROSTATE SURGERY    . SHOULDER ARTHROSCOPY      Prior to Admission medications   Medication Sig Start Date End Date Taking? Authorizing Provider  amLODipine (NORVASC) 10 MG tablet Take 1 tablet (10 mg total) by mouth daily. 12/08/15   Vaughan Basta, MD  aspirin 81 MG chewable tablet Chew 1 tablet (81 mg total) by mouth daily. 12/08/15   Vaughan Basta, MD  atorvastatin (LIPITOR) 80 MG tablet Take 1 tablet by mouth daily.    [provider]  lisinopril (PRINIVIL,ZESTRIL) 10 MG tablet Take 1 tablet (10 mg total) by mouth daily. 12/08/15   Vaughan Basta, MD  metFORMIN (GLUCOPHAGE-XR) 500 MG 24 hr tablet Take 1,000 mg by mouth daily with breakfast.     [provider]  nitroGLYCERIN (NITROSTAT) 0.4 MG SL  tablet Place 0.4 mg under the tongue every 5 (five) minutes as needed for chest pain.    [provider]  pantoprazole (PROTONIX) 40 MG tablet Take 1 tablet (40 mg total) by mouth daily. 01/18/19 01/18/20  Lucilla Lame, MD    Allergies Patient has no known allergies.  No family history on file.  Social History Social History   Tobacco Use  . Smoking status: Never Smoker  . Smokeless tobacco: Current User     Types: Chew  Vaping Use  . Vaping Use: Never used  Substance Use Topics  . Alcohol use: No  . Drug use: Not Currently    Review of Systems  Review of Systems  Constitutional: Negative for chills, fatigue and fever.  HENT: Negative for sore throat.   Respiratory: Negative for shortness of breath.   Cardiovascular: Negative for chest pain.  Gastrointestinal: Negative for abdominal pain.  Genitourinary: Negative for flank pain.  Musculoskeletal: Negative for neck pain.  Skin: Negative for rash and wound.  Allergic/Immunologic: Negative for immunocompromised state.  Neurological: Negative for weakness and numbness.  Hematological: Does not bruise/bleed easily.  All other systems reviewed and are negative.    ____________________________________________  PHYSICAL EXAM:      VITAL SIGNS: ED Triage Vitals  Enc Vitals Group     BP 11/18/19 0852 134/63     Pulse Rate 11/18/19 0852 80     Resp 11/18/19 0852 16     Temp 11/18/19 0852 98.9 F (37.2 C)     Temp Source 11/18/19 0852 Oral     SpO2 11/18/19 0852 96 %     Weight 11/18/19 0849 258 lb (117 kg)     Height 11/18/19 0849 5\' 8"  (1.727 m)     Head Circumference --      Peak Flow --      Pain Score 11/18/19 0849 7     Pain Loc --      Pain Edu? --      Excl. in [place]? --      Physical Exam Vitals and nursing note reviewed.  Constitutional:      General: He is not in acute distress.    Appearance: He is well-developed.  HENT:     Head: Normocephalic and atraumatic.  Eyes:     Conjunctiva/sclera: Conjunctivae normal.  Cardiovascular:     Rate and Rhythm: Normal rate and regular rhythm.     Heart sounds: Normal heart sounds. No murmur heard.  No friction rub.  Pulmonary:     Effort: Pulmonary effort is normal. No respiratory distress.     Breath sounds: Normal breath sounds. No wheezing or rales.  Abdominal:     General: There is no distension.     Palpations: Abdomen is soft.     Tenderness: There is no  abdominal tenderness.  Musculoskeletal:     Cervical back: Neck supple.  Skin:    General: Skin is warm.     Capillary Refill: Capillary refill takes less than 2 seconds.  Neurological:     Mental Status: He is alert and oriented to person, place, and time.     Motor: No abnormal muscle tone.       ____________________________________________   LABS (all labs ordered are listed, but only abnormal results are displayed)  Labs Reviewed  CBC - Abnormal; Notable for the following components:      Result Value   RBC 4.07 (*)    Hemoglobin 11.8 (*)    HCT 33.5 (*)  All other components within normal limits  URINALYSIS, COMPLETE (UACMP) WITH MICROSCOPIC - Abnormal; Notable for the following components:   Color, Urine STRAW (*)    APPearance HAZY (*)    Glucose, UA >=500 (*)    All other components within normal limits  BASIC METABOLIC PANEL - Abnormal; Notable for the following components:   Sodium 127 (*)    Chloride 97 (*)    CO2 20 (*)    Glucose, Bld 463 (*)    BUN 41 (*)    Creatinine, Ser 2.16 (*)    GFR calc non Af Amer 29 (*)    GFR calc Af Amer 34 (*)    All other components within normal limits  GLUCOSE, CAPILLARY - Abnormal; Notable for the following components:   Glucose-Capillary 416 (*)    All other components within normal limits  GLUCOSE, CAPILLARY - Abnormal; Notable for the following components:   Glucose-Capillary 379 (*)    All other components within normal limits  GLUCOSE, CAPILLARY - Abnormal; Notable for the following components:   Glucose-Capillary 256 (*)    All other components within normal limits  CBG MONITORING, ED  CBG MONITORING, ED  CBG MONITORING, ED  TROPONIN I (HIGH SENSITIVITY)  TROPONIN I (HIGH SENSITIVITY)    ____________________________________________  EKG: Normal sinus rhythm, ventricular rate 81.  PR 186, QRS 96, QTc 408.  No acute ST elevations or depressions.  No EKG evidence of acute ischemia or  infarct. ________________________________________  RADIOLOGY All imaging, including plain films, CT scans, and ultrasounds, independently reviewed by me, and interpretations confirmed via formal radiology reads.  ED MD interpretation:   Chest x-ray: Clear, no acute problems  Official radiology report(s): DG Chest 2 View  Result Date: 11/18/2019 CLINICAL DATA:  Chest pain. EXAM: CHEST - 2 VIEW COMPARISON:  July 24, 2019 FINDINGS: Sternotomy wires are intact. The heart, hila, mediastinum, lungs, and pleura are normal. No cause for chest pain identified. IMPRESSION: No active cardiopulmonary disease. Electronically Signed   By: Dorise Bullion III M.D   On: 11/18/2019 09:46    ____________________________________________  PROCEDURES   Procedure(s) performed (including Critical Care):  Procedures  ____________________________________________  INITIAL IMPRESSION / MDM / [place] / ED COURSE  As part of my medical decision making, I reviewed the following data within the [place] notes reviewed and incorporated, Old chart reviewed, Notes from prior ED visits, and [place] Controlled Substance Database       *XZAVIEN HARADA was evaluated in Emergency Department on 11/18/2019 for the symptoms described in the history of present illness. He was evaluated in the context of the global COVID-19 pandemic, which necessitated consideration that the patient might be at risk for infection with the SARS-CoV-2 virus that causes COVID-19. Institutional protocols and algorithms that pertain to the evaluation of patients at risk for COVID-19 are in a state of rapid change based on information released by regulatory bodies including the CDC and federal and state organizations. These policies and algorithms were followed during the patient's care in the ED.  Some ED evaluations and interventions may be delayed as a result of limited staffing during the pandemic.*      Medical Decision Making: 74 year old male here with hyperglycemia.  He admits to dietary indiscretion and not taking his oral agents.  On arrival here, he is otherwise very well-appearing and in no distress.  He was given fluids in the waiting room and states he feels much better.  Repeat  sugar less than 300.  Of note, BMP does show hyperglycemia and possible mild dehydration related to this, but his anion gap is normal.  As mentioned, he has been given fluids and I am hesitant to give additional in the setting of possible CHF per his report.  He is tolerating p.o. and has drank multiple glasses of water in the ED.  I have a long discussion with him regarding his lab work, need for following up with his doctor, as well as taking his medications.  Given that he has been given fluids and is tolerating p.o., will take his medications, and monitor his sugars at home, feels reasonable to discharge as he has no other clinical evidence of DKA or severe kidney injury.  I advised him to call his PCP for urgent follow-up this week.  ____________________________________________  FINAL CLINICAL IMPRESSION(S) / ED DIAGNOSES  Final diagnoses:  Hyperglycemia     MEDICATIONS GIVEN DURING THIS VISIT:  Medications  sodium chloride 0.9 % bolus 1,000 mL (has no administration in time range)  sodium chloride 0.9 % bolus 1,000 mL (0 mLs Intravenous Stopped 11/18/19 1504)     ED Discharge Orders    None       Note:  This document was prepared using Dragon voice recognition software and may include unintentional dictation errors.   Duffy Bruce, MD 11/18/19 1535

## 2019-11-18 NOTE — ED Triage Notes (Signed)
Pt to ED via [facility] from home for hyperglycemia. Per pt his blood sugar was high when he saw his PCP yesterday. Pt states that he was not told to follow up. Pt has not been checking his CBG at home. Pt was ambulatory to triage without difficulty.

## 2019-11-18 NOTE — Discharge Instructions (Addendum)
It is VERY important to take your medication AS PRESCRIBED  Try to eat a healthy diabetic diet  Call your doctor to set up follow-up

## 2019-11-18 NOTE — ED Notes (Addendum)
Pt states his blood sugar was high at home, states he only takes metformin for diabetes control. Pt denies n/v/d, dizziness, blurry vision, or pain.

## 2019-11-18 NOTE — ED Notes (Addendum)
CBG:375

## 2019-11-18 NOTE — ED Triage Notes (Signed)
Pt in via EMS from home. EMS reports pt was seen at [facility] yesterday and told his blood sugar was high. Pt was not told to follow up. Pt has not been checking his blood sugar regularly. Pt reports here so we can get his blood sugar down. 148/98, 96% RA, 88 HR, FSBS 509

## 2019-11-18 NOTE — ED Notes (Signed)
Spoke with Dr. Charna Archer. Verbal order for 1 liter NS bolus

## 2019-11-18 NOTE — ED Notes (Signed)
Pt just reported to this RN that he is having chest pain that started in the ambulance on the way to the hospital.

## 2019-11-25 ENCOUNTER — Other Ambulatory Visit: Payer: Self-pay

## 2019-11-25 ENCOUNTER — Emergency Department: Payer: Medicare Other

## 2019-11-25 ENCOUNTER — Inpatient Hospital Stay
Admission: EM | Admit: 2019-11-25 | Discharge: 2019-11-29 | DRG: 281 | Disposition: A | Discharge status: Home / Self Care | Payer: Medicare Other | Attending: Internal Medicine | Admitting: Internal Medicine

## 2019-11-25 DIAGNOSIS — I214 Non-ST elevation (NSTEMI) myocardial infarction: Secondary | ICD-10-CM | POA: Diagnosis not present

## 2019-11-25 DIAGNOSIS — I129 Hypertensive chronic kidney disease with stage 1 through stage 4 chronic kidney disease, or unspecified chronic kidney disease: Secondary | ICD-10-CM | POA: Diagnosis present

## 2019-11-25 DIAGNOSIS — N1832 Chronic kidney disease, stage 3b: Secondary | ICD-10-CM | POA: Diagnosis present

## 2019-11-25 DIAGNOSIS — R778 Other specified abnormalities of plasma proteins: Secondary | ICD-10-CM

## 2019-11-25 DIAGNOSIS — Z6838 Body mass index (BMI) 38.0-38.9, adult: Secondary | ICD-10-CM

## 2019-11-25 DIAGNOSIS — I200 Unstable angina: Secondary | ICD-10-CM | POA: Diagnosis not present

## 2019-11-25 DIAGNOSIS — Z951 Presence of aortocoronary bypass graft: Secondary | ICD-10-CM

## 2019-11-25 DIAGNOSIS — E875 Hyperkalemia: Secondary | ICD-10-CM | POA: Diagnosis present

## 2019-11-25 DIAGNOSIS — N179 Acute kidney failure, unspecified: Secondary | ICD-10-CM | POA: Diagnosis present

## 2019-11-25 DIAGNOSIS — R079 Chest pain, unspecified: Secondary | ICD-10-CM

## 2019-11-25 DIAGNOSIS — Z20822 Contact with and (suspected) exposure to covid-19: Secondary | ICD-10-CM | POA: Diagnosis present

## 2019-11-25 DIAGNOSIS — I25700 Atherosclerosis of coronary artery bypass graft(s), unspecified, with unstable angina pectoris: Secondary | ICD-10-CM | POA: Diagnosis not present

## 2019-11-25 DIAGNOSIS — R001 Bradycardia, unspecified: Secondary | ICD-10-CM | POA: Diagnosis present

## 2019-11-25 DIAGNOSIS — IMO0002 Reserved for concepts with insufficient information to code with codable children: Secondary | ICD-10-CM | POA: Diagnosis present

## 2019-11-25 DIAGNOSIS — E1122 Type 2 diabetes mellitus with diabetic chronic kidney disease: Secondary | ICD-10-CM | POA: Diagnosis present

## 2019-11-25 DIAGNOSIS — Z79899 Other long term (current) drug therapy: Secondary | ICD-10-CM

## 2019-11-25 DIAGNOSIS — E1165 Type 2 diabetes mellitus with hyperglycemia: Secondary | ICD-10-CM | POA: Diagnosis present

## 2019-11-25 DIAGNOSIS — Z7984 Long term (current) use of oral hypoglycemic drugs: Secondary | ICD-10-CM

## 2019-11-25 DIAGNOSIS — I25110 Atherosclerotic heart disease of native coronary artery with unstable angina pectoris: Secondary | ICD-10-CM | POA: Diagnosis present

## 2019-11-25 DIAGNOSIS — E785 Hyperlipidemia, unspecified: Secondary | ICD-10-CM | POA: Diagnosis present

## 2019-11-25 DIAGNOSIS — Z7982 Long term (current) use of aspirin: Secondary | ICD-10-CM

## 2019-11-25 DIAGNOSIS — I429 Cardiomyopathy, unspecified: Secondary | ICD-10-CM | POA: Diagnosis present

## 2019-11-25 DIAGNOSIS — I2510 Atherosclerotic heart disease of native coronary artery without angina pectoris: Secondary | ICD-10-CM | POA: Diagnosis present

## 2019-11-25 DIAGNOSIS — K219 Gastro-esophageal reflux disease without esophagitis: Secondary | ICD-10-CM | POA: Diagnosis present

## 2019-11-25 DIAGNOSIS — I10 Essential (primary) hypertension: Secondary | ICD-10-CM | POA: Diagnosis present

## 2019-11-25 LAB — TROPONIN I (HIGH SENSITIVITY)
Troponin I (High Sensitivity): 12 ng/L (ref <18)
Troponin I (High Sensitivity): 63 ng/L — ABNORMAL HIGH (ref <18)
Troponin I (High Sensitivity): 455 ng/L (ref <18)
Troponin I (High Sensitivity): 793 ng/L (ref <18)

## 2019-11-25 LAB — BASIC METABOLIC PANEL
Anion gap: 9 (ref 5–15)
BUN: 27 mg/dL — ABNORMAL HIGH (ref 8–23)
CO2: 22 mmol/L (ref 22–32)
Calcium: 9.8 mg/dL (ref 8.9–10.3)
Chloride: 99 mmol/L (ref 98–111)
Creatinine, Ser: 2.03 mg/dL — ABNORMAL HIGH (ref 0.61–1.24)
GFR calc Af Amer: 37 mL/min — ABNORMAL LOW (ref >60)
GFR calc non Af Amer: 32 mL/min — ABNORMAL LOW (ref >60)
Glucose, Bld: 365 mg/dL — ABNORMAL HIGH (ref 70–99)
Potassium: 5.3 mmol/L — ABNORMAL HIGH (ref 3.5–5.1)
Sodium: 130 mmol/L — ABNORMAL LOW (ref 135–145)

## 2019-11-25 LAB — CBC
HCT: 32.8 % — ABNORMAL LOW (ref 39.0–52.0)
Hemoglobin: 11.4 g/dL — ABNORMAL LOW (ref 13.0–17.0)
MCH: 29.4 pg (ref 26.0–34.0)
MCHC: 34.8 g/dL (ref 30.0–36.0)
MCV: 84.5 fL (ref 80.0–100.0)
Platelets: 290 10*3/uL (ref 150–400)
RBC: 3.88 MIL/uL — ABNORMAL LOW (ref 4.22–5.81)
RDW: 13.8 % (ref 11.5–15.5)
WBC: 10.1 10*3/uL (ref 4.0–10.5)
nRBC: 0 % (ref 0.0–0.2)

## 2019-11-25 LAB — PROTIME-INR
INR: 1 (ref 0.8–1.2)
Prothrombin Time: 12.6 seconds (ref 11.4–15.2)

## 2019-11-25 LAB — GLUCOSE, CAPILLARY
Glucose-Capillary: 198 mg/dL — ABNORMAL HIGH (ref 70–99)
Glucose-Capillary: 266 mg/dL — ABNORMAL HIGH (ref 70–99)

## 2019-11-25 LAB — HEMOGLOBIN A1C
Hgb A1c MFr Bld: 12.8 % — ABNORMAL HIGH (ref 4.8–5.6)
Mean Plasma Glucose: 320.66 mg/dL

## 2019-11-25 LAB — APTT: aPTT: 29 seconds (ref 24–36)

## 2019-11-25 LAB — SARS CORONAVIRUS 2 BY RT PCR (HOSPITAL ORDER, PERFORMED IN ~~LOC~~ HOSPITAL LAB): SARS Coronavirus 2: NEGATIVE

## 2019-11-25 MED ORDER — NITROGLYCERIN 0.4 MG SL SUBL
0.4000 mg | SUBLINGUAL_TABLET | SUBLINGUAL | Status: DC | PRN
  Administered 2019-11-26: 0.4 mg via SUBLINGUAL
  Filled 2019-11-25: qty 1

## 2019-11-25 MED ORDER — ASPIRIN 81 MG PO CHEW
81.0000 mg | CHEWABLE_TABLET | Freq: Every day | ORAL | Status: DC
  Administered 2019-11-26 – 2019-11-29 (×4): 81 mg via ORAL
  Filled 2019-11-25 (×4): qty 1

## 2019-11-25 MED ORDER — INSULIN ASPART 100 UNIT/ML ~~LOC~~ SOLN
0.0000 [IU] | Freq: Every day | SUBCUTANEOUS | Status: DC
  Administered 2019-11-25: 3 [IU] via SUBCUTANEOUS
  Administered 2019-11-26 – 2019-11-28 (×2): 2 [IU] via SUBCUTANEOUS
  Filled 2019-11-25 (×2): qty 1

## 2019-11-25 MED ORDER — INSULIN ASPART 100 UNIT/ML ~~LOC~~ SOLN
0.0000 [IU] | Freq: Three times a day (TID) | SUBCUTANEOUS | Status: DC
  Administered 2019-11-25 – 2019-11-26 (×2): 4 [IU] via SUBCUTANEOUS
  Administered 2019-11-26: 7 [IU] via SUBCUTANEOUS
  Administered 2019-11-27: 4 [IU] via SUBCUTANEOUS
  Administered 2019-11-27: 3 [IU] via SUBCUTANEOUS
  Administered 2019-11-27 – 2019-11-28 (×3): 4 [IU] via SUBCUTANEOUS
  Administered 2019-11-29: 3 [IU] via SUBCUTANEOUS
  Filled 2019-11-25 (×9): qty 1

## 2019-11-25 MED ORDER — ENOXAPARIN SODIUM 40 MG/0.4ML ~~LOC~~ SOLN: 40.0000 mg | SUBCUTANEOUS | Status: DC

## 2019-11-25 MED ORDER — ENOXAPARIN SODIUM 30 MG/0.3ML ~~LOC~~ SOLN: 30.0000 mg | SUBCUTANEOUS | Status: DC

## 2019-11-25 MED ORDER — ASPIRIN 81 MG PO CHEW
243.0000 mg | CHEWABLE_TABLET | Freq: Once | ORAL | Status: AC
  Administered 2019-11-25: 243 mg via ORAL
  Filled 2019-11-25: qty 3

## 2019-11-25 MED ORDER — HEPARIN BOLUS VIA INFUSION
4000.0000 [IU] | Freq: Once | INTRAVENOUS | Status: AC
  Administered 2019-11-25: 4000 [IU] via INTRAVENOUS
  Filled 2019-11-25: qty 4000

## 2019-11-25 MED ORDER — NITROGLYCERIN 2 % TD OINT
0.5000 [in_us] | TOPICAL_OINTMENT | Freq: Four times a day (QID) | TRANSDERMAL | Status: DC
  Administered 2019-11-25 – 2019-11-29 (×15): 0.5 [in_us] via TOPICAL
  Filled 2019-11-25 (×14): qty 1

## 2019-11-25 MED ORDER — ACETAMINOPHEN 325 MG PO TABS: 650.0000 mg | ORAL_TABLET | ORAL | Status: DC | PRN

## 2019-11-25 MED ORDER — LISINOPRIL 10 MG PO TABS: 10.0000 mg | ORAL_TABLET | Freq: Every day | ORAL | Status: DC

## 2019-11-25 MED ORDER — SODIUM CHLORIDE 0.9% FLUSH: 3.0000 mL | Freq: Once | INTRAVENOUS | Status: DC

## 2019-11-25 MED ORDER — PANTOPRAZOLE SODIUM 40 MG PO TBEC
40.0000 mg | DELAYED_RELEASE_TABLET | Freq: Every day | ORAL | Status: DC
  Administered 2019-11-26 – 2019-11-29 (×4): 40 mg via ORAL
  Filled 2019-11-25 (×4): qty 1

## 2019-11-25 MED ORDER — ASPIRIN 81 MG PO CHEW: 324.0000 mg | CHEWABLE_TABLET | ORAL | Status: AC

## 2019-11-25 MED ORDER — NITROGLYCERIN 0.4 MG SL SUBL: 0.4000 mg | SUBLINGUAL_TABLET | SUBLINGUAL | Status: DC | PRN

## 2019-11-25 MED ORDER — ASPIRIN 81 MG PO CHEW: 81.0000 mg | CHEWABLE_TABLET | Freq: Every day | ORAL | Status: DC

## 2019-11-25 MED ORDER — METOPROLOL TARTRATE 25 MG PO TABS
25.0000 mg | ORAL_TABLET | Freq: Two times a day (BID) | ORAL | Status: DC
  Administered 2019-11-25 – 2019-11-29 (×8): 25 mg via ORAL
  Filled 2019-11-25 (×8): qty 1

## 2019-11-25 MED ORDER — ASPIRIN 300 MG RE SUPP: 300.0000 mg | RECTAL | Status: AC

## 2019-11-25 MED ORDER — ONDANSETRON HCL 4 MG/2ML IJ SOLN: 4.0000 mg | Freq: Four times a day (QID) | INTRAMUSCULAR | Status: DC | PRN

## 2019-11-25 MED ORDER — HEPARIN (PORCINE) 25000 UT/250ML-% IV SOLN
1200.0000 [IU]/h | INTRAVENOUS | Status: DC
  Administered 2019-11-25: 1200 [IU]/h via INTRAVENOUS
  Filled 2019-11-25: qty 250

## 2019-11-25 MED ORDER — ATORVASTATIN CALCIUM 80 MG PO TABS
80.0000 mg | ORAL_TABLET | Freq: Every day | ORAL | Status: DC
  Administered 2019-11-26 – 2019-11-29 (×4): 80 mg via ORAL
  Filled 2019-11-25 (×4): qty 1

## 2019-11-25 MED ORDER — AMLODIPINE BESYLATE 5 MG PO TABS: 10.0000 mg | ORAL_TABLET | Freq: Every day | ORAL | Status: DC

## 2019-11-25 NOTE — ED Provider Notes (Signed)
[facility] Emergency Department Provider Note ____________________________________________   First MD Initiated Contact with Patient 11/25/19 1126     (approximate)  I have reviewed the triage vital signs and the nursing notes.   HISTORY  Chief Complaint Chest Pain    HPI Justin Keith is a 74 y.o. male with PMH as noted below including CAD status post CABG who presents with chest pain, acute onset this morning around 1030 or 11 AM, described as a squeezing, mainly in the left chest, nonradiating.  It is not associated with shortness of breath, nausea, or lightheadedness but the patient states he does feel slightly weak.  He states that he has had similar pain before.  He takes 81 mg of aspirin daily, and took it this morning.  He states that the pain started after he had been doing yard work.  Past Medical History:  Diagnosis Date  . CAD (coronary artery disease)   . CKD (chronic kidney disease)   . Diabetes mellitus without complication ([place])   . GERD (gastroesophageal reflux disease)   . Hyperlipemia   . Hypertension     Patient Active Problem List   Diagnosis Date Noted  . Chest pain with high risk of acute coronary syndrome 03/15/2019  . Problems with swallowing and mastication   . Chest pain in adult 10/22/2018  . HTN (hypertension) 09/15/2018  . HLD (hyperlipidemia) 09/15/2018  . GERD (gastroesophageal reflux disease) 09/15/2018  . CAD (coronary artery disease) 09/15/2018  . Diabetes ([place]) 09/15/2018  . Chest pain 12/07/2015    Past Surgical History:  Procedure Laterality Date  . CARDIAC CATHETERIZATION    . CARDIAC SURGERY    . COLONOSCOPY WITH PROPOFOL N/A 02/25/2018   Procedure: COLONOSCOPY WITH PROPOFOL;  Surgeon: Jonathon Bellows, MD;  Location: [facility] ENDOSCOPY;  Service: Gastroenterology;  Laterality: N/A;  . COLONOSCOPY WITH PROPOFOL N/A 02/26/2018   Procedure: COLONOSCOPY WITH PROPOFOL;  Surgeon: Jonathon Bellows, MD;  Location: [facility]  ENDOSCOPY;  Service: Gastroenterology;  Laterality: N/A;  . ESOPHAGOGASTRODUODENOSCOPY (EGD) WITH PROPOFOL N/A 01/18/2019   Procedure: ESOPHAGOGASTRODUODENOSCOPY (EGD) WITH PROPOFOL;  Surgeon: Lucilla Lame, MD;  Location: [facility] ENDOSCOPY;  Service: Endoscopy;  Laterality: N/A;  . FOREIGN BODY REMOVAL N/A 03/07/2018   Procedure: FOREIGN BODY REMOVAL;  Surgeon: Jonathon Bellows, MD;  Location: [facility] ENDOSCOPY;  Service: Gastroenterology;  Laterality: N/A;  . LEFT HEART CATH AND CORS/GRAFTS ANGIOGRAPHY N/A 08/07/2017   Procedure: LEFT HEART CATH AND CORS/GRAFTS ANGIOGRAPHY;  Surgeon: Yolonda Kida, MD;  Location: [place] CV LAB;  Service: Cardiovascular;  Laterality: N/A;  . PROSTATE SURGERY    . SHOULDER ARTHROSCOPY      Prior to Admission medications   Medication Sig Start Date End Date Taking? Authorizing Provider  amLODipine (NORVASC) 10 MG tablet Take 1 tablet (10 mg total) by mouth daily. 12/08/15   Vaughan Basta, MD  aspirin 81 MG chewable tablet Chew 1 tablet (81 mg total) by mouth daily. 12/08/15   Vaughan Basta, MD  atorvastatin (LIPITOR) 80 MG tablet Take 1 tablet by mouth daily.    [provider]  lisinopril (PRINIVIL,ZESTRIL) 10 MG tablet Take 1 tablet (10 mg total) by mouth daily. 12/08/15   Vaughan Basta, MD  metFORMIN (GLUCOPHAGE-XR) 500 MG 24 hr tablet Take 1,000 mg by mouth daily with breakfast.     [provider]  nitroGLYCERIN (NITROSTAT) 0.4 MG SL tablet Place 0.4 mg under the tongue every 5 (five) minutes as needed for chest pain.    [provider]  pantoprazole (PROTONIX) 40 MG tablet Take 1 tablet (40 mg total) by mouth daily. 01/18/19 01/18/20  Lucilla Lame, MD    Allergies Patient has no known allergies.  History reviewed. No pertinent family history.  Social History Social History   Tobacco Use  . Smoking status: Never Smoker  . Smokeless tobacco: Current User    Types: Chew  Vaping Use  . Vaping Use: Never  used  Substance Use Topics  . Alcohol use: No  . Drug use: Not Currently    Review of Systems  Constitutional: No fever. Eyes: No visual changes. ENT: No sore throat. Cardiovascular: Positive for chest pain. Respiratory: Denies shortness of breath. Gastrointestinal: No vomiting or diarrhea.  Genitourinary: Negative for flank pain. Musculoskeletal: Negative for back pain. Skin: Negative for rash. Neurological: Negative for headache.   ____________________________________________   PHYSICAL EXAM:  VITAL SIGNS: ED Triage Vitals [11/25/19 1110]  Enc Vitals Group     BP 121/62     Pulse Rate 69     Resp 18     Temp 97.8 F (36.6 C)     Temp Source Oral     SpO2 100 %     Weight 250 lb (113.4 kg)     Height 5\' 8"  (1.727 m)     Head Circumference      Peak Flow      Pain Score 9     Pain Loc      Pain Edu?      Excl. in [place]?     Constitutional: Alert and oriented. Well appearing and in no acute distress. Eyes: Conjunctivae are normal.  Head: Atraumatic. Nose: No congestion/rhinnorhea. Mouth/Throat: Mucous membranes are moist.   Neck: Normal range of motion.  Cardiovascular: Normal rate, regular rhythm. Grossly normal heart sounds.  Good peripheral circulation. Respiratory: Normal respiratory effort.  No retractions. Lungs CTAB. Gastrointestinal: No distention.  Musculoskeletal: No lower extremity edema.  Extremities warm and well perfused.  Neurologic:  Normal speech and language. No gross focal neurologic deficits are appreciated.  Skin:  Skin is warm and dry. No rash noted. Psychiatric: Mood and affect are normal. Speech and behavior are normal.  ____________________________________________   LABS (all labs ordered are listed, but only abnormal results are displayed)  Labs Reviewed  BASIC METABOLIC PANEL - Abnormal; Notable for the following components:      Result Value   Sodium 130 (*)    Potassium 5.3 (*)    Glucose, Bld 365 (*)    BUN 27 (*)     Creatinine, Ser 2.03 (*)    GFR calc non Af Amer 32 (*)    GFR calc Af Amer 37 (*)    All other components within normal limits  CBC - Abnormal; Notable for the following components:   RBC 3.88 (*)    Hemoglobin 11.4 (*)    HCT 32.8 (*)    All other components within normal limits  TROPONIN I (HIGH SENSITIVITY) - Abnormal; Notable for the following components:   Troponin I (High Sensitivity) 63 (*)    All other components within normal limits  SARS CORONAVIRUS 2 BY RT PCR (HOSPITAL ORDER, [place] [facility])  TROPONIN I (HIGH SENSITIVITY)   ____________________________________________  EKG  ED ECG REPORT I, Arta Silence, the attending physician, personally viewed and interpreted this ECG.  Date: 11/25/2019 EKG Time: 1104 Rate: 71 Rhythm: normal sinus rhythm QRS Axis: normal Intervals: normal ST/T Wave abnormalities: normal Narrative Interpretation: no  evidence of acute ischemia  ____________________________________________  RADIOLOGY  CXR: No focal infiltrate or edema  ____________________________________________   PROCEDURES  Procedure(s) performed: No  Procedures  Critical Care performed: No ____________________________________________   INITIAL IMPRESSION / ASSESSMENT AND PLAN / ED COURSE  Pertinent labs & imaging results that were available during my care of the patient were reviewed by me and considered in my medical decision making (see chart for details).  74 year old male with PMH as noted above including history of CAD status post CABG presents with chest pain which occurred after doing yard work.  It is still present currently, but nonradiating and not associated with any significant symptoms.  I reviewed the past medical records in [place].  Patient was most recently seen in the ED in October of last year with chest pain and negative cardiac enzymes, but was admitted for monitoring given his CAD history and elevated ACS risk.   He last had a cardiac catheterization in 2019 showing disease in several vessels but with no interventions at that time.  On exam currently, the patient is overall well-appearing.  His vital signs are normal.  The physical exam is otherwise unremarkable.  EKG shows no acute ischemic findings.  Overall I have a low suspicion for ACS, however the patient does have increased risk given his CAD history and the exertional nature of the symptoms.  There is no clinical evidence to support PE, aortic dissection, or other vascular etiology especially as he has had similar pain in the past.  We will give an additional 243 mg of aspirin, obtain basic labs, cardiac enzymes x2, and reassess.  ----------------------------------------- 3:12 PM on 11/25/2019 -----------------------------------------  Initial troponin was normal, however the repeat is 63.  The patient states that the chest pain has improved.  There is no indication for anticoagulation, but he will need admission for further monitoring and serial enzymes.  I discussed the case with Dr. Francine Graven from the hospitalist service.  ____________________________________________   FINAL CLINICAL IMPRESSION(S) / ED DIAGNOSES  Final diagnoses:  Chest pain, unspecified type  Elevated troponin      NEW MEDICATIONS STARTED DURING THIS VISIT:  New Prescriptions   No medications on file     Note:  This document was prepared using Dragon voice recognition software and may include unintentional dictation errors.    Arta Silence, MD 11/25/19 1513

## 2019-11-25 NOTE — ED Triage Notes (Signed)
Pt arrives via POV from home for reports of left sided chest pain that started this morning while mowing the yard. PT reports hx bypass surgery in 2015. Pt in NAD at this time, skin warm and dry.

## 2019-11-25 NOTE — Progress Notes (Signed)
ANTICOAGULATION CONSULT NOTE - Initial Consult  Pharmacy Consult for heparin  Indication: chest pain/ACS  No Known Allergies  Patient Measurements: Height: 5\' 8"  (172.7 cm) Weight: 108.1 kg (238 lb 6.4 oz) IBW/kg (Calculated) : 68.4 Heparin Dosing Weight: 92.3 kg  Vital Signs: Temp: 98.2 F (36.8 C) (06/25 1804) Temp Source: Oral (06/25 1804) BP: 113/70 (06/25 1804) Pulse Rate: 61 (06/25 1804)  Labs: Recent Labs    11/25/19 1118 11/25/19 1350 11/25/19 1716  HGB 11.4*  --   --   HCT 32.8*  --   --   PLT 290  --   --   CREATININE 2.03*  --   --   TROPONINIHS 12 63* 455*    Estimated Creatinine Clearance: 38.6 mL/min (A) (by C-G formula based on SCr of 2.03 mg/dL (H)).   Medical History: Past Medical History:  Diagnosis Date  . CAD (coronary artery disease)   . CKD (chronic kidney disease)   . Diabetes mellitus without complication ([place])   . GERD (gastroesophageal reflux disease)   . Hyperlipemia   . Hypertension      Assessment: 74 yo male here with chest pain and troponins trending up to start on heparin drip. No oral anticoagulants noted on PTA med list.   Goal of Therapy:  Heparin level 0.3-0.7 units/ml Monitor platelets by anticoagulation protocol: Yes   Plan:  D/c ppx enoxaparin Stat INR and aPTT baseline labs  Give 4000 units bolus x 1 Start heparin infusion at 1200 units/hr Check anti-Xa level in 8 hours and daily while on heparin Continue to monitor H&H and platelets - CBC in AM.   Pharmacy will continue to follow.   Rocky Morel 11/25/2019,6:47 PM

## 2019-11-25 NOTE — ED Notes (Signed)
Admitting md at bedside

## 2019-11-25 NOTE — H&P (Signed)
History and Physical    Justin Keith YFV:[id number] DOB: Mar 17, 1946 DOA: 11/25/2019  PCP: [facility] [place]   Patient coming from: Home  I have personally briefly reviewed patient's old medical records in [place]  Chief Complaint: Chest pain  HPI: Justin Keith is a 74 y.o. 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  He describes it as a squeezing sensation over the left anterior chest wall, it was nonradiating and he denied having any associated nausea, vomiting, diaphoresis or lightheadedness.  He continues to complain of chest pain but rated a 5 x 10 in intensity at its worst. Twelve-lead EKG showed normal sinus rhythm with LVH Labs revealed a bump in his troponin from 12 to 63.  He also has slightly elevated potassium levels and an increase in his serum creatinine from a baseline of 1.3 to 2.03   ED Course: Elderly male with a history of coronary artery disease status post CABG who presents to the emergency room for evaluation of chest pain mostly over the left anterior chest wall.  Pain was nonradiating and he denied having any associated symptoms.  He has no EKG changes but has a bump in his troponin levels.  He will be referred to observation status for further evaluation.  Review of Systems: As per HPI otherwise 10 point review of systems negative.    Past Medical History:  Diagnosis Date  . CAD (coronary artery disease)   . CKD (chronic kidney disease)   . Diabetes mellitus without complication ([place])   . GERD (gastroesophageal reflux disease)   . Hyperlipemia   . Hypertension     Past Surgical History:  Procedure Laterality Date  . CARDIAC  CATHETERIZATION    . CARDIAC SURGERY    . COLONOSCOPY WITH PROPOFOL N/A 02/25/2018   Procedure: COLONOSCOPY WITH PROPOFOL;  Surgeon: Jonathon Bellows, MD;  Location: [facility] ENDOSCOPY;  Service: Gastroenterology;  Laterality: N/A;  . COLONOSCOPY WITH PROPOFOL N/A 02/26/2018   Procedure: COLONOSCOPY WITH PROPOFOL;  Surgeon: Jonathon Bellows, MD;  Location: [facility] ENDOSCOPY;  Service: Gastroenterology;  Laterality: N/A;  . ESOPHAGOGASTRODUODENOSCOPY (EGD) WITH PROPOFOL N/A 01/18/2019   Procedure: ESOPHAGOGASTRODUODENOSCOPY (EGD) WITH PROPOFOL;  Surgeon: Lucilla Lame, MD;  Location: [facility] ENDOSCOPY;  Service: Endoscopy;  Laterality: N/A;  . FOREIGN BODY REMOVAL N/A 03/07/2018   Procedure: FOREIGN BODY REMOVAL;  Surgeon: Jonathon Bellows, MD;  Location[facility] ENDOSCOPY;  Service: Gastroenterology;  Laterality: N/A;  . LEFT HEART CATH AND CORS/GRAFTS ANGIOGRAPHY N/A 08/07/2017   Procedure: LEFT HEART CATH AND CORS/GRAFTS ANGIOGRAPHY;  Surgeon: Yolonda Kida, MD;  Location: [place] CV LAB;  Service: Cardiovascular;  Laterality: N/A;  . PROSTATE SURGERY    . SHOULDER ARTHROSCOPY       reports that he has never smoked. His smokeless tobacco use includes chew. He reports previous drug use. He reports that he does not drink alcohol.  No Known Allergies  History reviewed. No pertinent family history.   Prior to Admission medications   Medication Sig Start Date End Date Taking? Authorizing Provider  amLODipine (NORVASC) 10 MG tablet Take 1 tablet (10 mg total) by mouth daily. 12/08/15  Yes Vaughan Basta, MD  aspirin 81 MG chewable tablet Chew 1 tablet (81  mg total) by mouth daily. 12/08/15  Yes Vaughan Basta, MD  atorvastatin (LIPITOR) 80 MG tablet Take 80 mg by mouth daily.    Yes [provider]  lisinopril (PRINIVIL,ZESTRIL) 10 MG tablet Take 1 tablet (10 mg total) by mouth daily. 12/08/15  Yes Vaughan Basta, MD  metFORMIN (GLUCOPHAGE-XR) 500 MG 24 hr tablet Take 1,000 mg by mouth  daily with breakfast.    Yes [provider]  nitroGLYCERIN (NITROSTAT) 0.4 MG SL tablet Place 0.4 mg under the tongue every 5 (five) minutes as needed for chest pain.   Yes [provider]  pantoprazole (PROTONIX) 40 MG tablet Take 1 tablet (40 mg total) by mouth daily. 01/18/19 01/18/20 Yes Lucilla Lame, MD    Physical Exam: Vitals:   11/25/19 1110 11/25/19 1350 11/25/19 1553  BP: 121/62 139/71   Pulse: 69 68   Resp: 18 16 15   Temp: 97.8 F (36.6 C)    TempSrc: Oral    SpO2: 100% 97%   Weight: 113.4 kg    Height: 5\' 8"  (1.727 m)       Vitals:   11/25/19 1110 11/25/19 1350 11/25/19 1553  BP: 121/62 139/71   Pulse: 69 68   Resp: 18 16 15   Temp: 97.8 F (36.6 C)    TempSrc: Oral    SpO2: 100% 97%   Weight: 113.4 kg    Height: 5\' 8"  (1.727 m)      Constitutional: NAD, alert and oriented x 3.  Appears comfortable and in no distress Eyes: PERRL, lids and conjunctivae normal ENMT: Mucous membranes are moist.  Neck: normal, supple, no masses, no thyromegaly Respiratory: clear to auscultation bilaterally, no wheezing, no crackles. Normal respiratory effort. No accessory muscle use.  Cardiovascular: Regular rate and rhythm, no murmurs / rubs / gallops. No extremity edema. 2+ pedal pulses. No carotid bruits.  Chest pain is nonreproducible Abdomen: no tenderness, no masses palpated. No hepatosplenomegaly. Bowel sounds positive.  Musculoskeletal: no clubbing / cyanosis. No joint deformity upper and lower extremities.  Skin: no rashes, lesions, ulcers.  Neurologic: No gross focal neurologic deficit. Psychiatric: Normal mood and affect.   Labs on Admission: I have personally reviewed following labs and imaging studies  CBC: Recent Labs  Lab 11/25/19 1118  WBC 10.1  HGB 11.4*  HCT 32.8*  MCV 84.5  PLT 818   Basic Metabolic Panel: Recent Labs  Lab 11/25/19 1118  NA 130*  K 5.3*  CL 99  CO2 22  GLUCOSE 365*  BUN 27*  CREATININE 2.03*  CALCIUM 9.8    GFR: Estimated Creatinine Clearance: 39.6 mL/min (A) (by C-G formula based on SCr of 2.03 mg/dL (H)). Liver Function Tests: No results for input(s): AST, ALT, ALKPHOS, BILITOT, PROT, ALBUMIN in the last 168 hours. No results for input(s): LIPASE, AMYLASE in the last 168 hours. No results for input(s): AMMONIA in the last 168 hours. Coagulation Profile: No results for input(s): INR, PROTIME in the last 168 hours. Cardiac Enzymes: No results for input(s): CKTOTAL, CKMB, CKMBINDEX, TROPONINI in the last 168 hours. BNP (last 3 results) No results for input(s): PROBNP in the last 8760 hours. HbA1C: No results for input(s): HGBA1C in the last 72 hours. CBG: No results for input(s): GLUCAP in the last 168 hours. Lipid Profile: No results for input(s): CHOL, HDL, LDLCALC, TRIG, CHOLHDL, LDLDIRECT in the last 72 hours. Thyroid Function Tests: No results for input(s): TSH, T4TOTAL, FREET4, T3FREE, THYROIDAB in the last 72 hours. Anemia Panel: No results for input(s):  VITAMINB12, FOLATE, FERRITIN, TIBC, IRON, RETICCTPCT in the last 72 hours. Urine analysis:    Component Value Date/Time   COLORURINE STRAW (A) 11/18/2019 0910   APPEARANCEUR HAZY (A) 11/18/2019 0910   LABSPEC 1.016 11/18/2019 0910   PHURINE 5.0 11/18/2019 0910   GLUCOSEU >=500 (A) 11/18/2019 0910   HGBUR NEGATIVE 11/18/2019 0910   BILIRUBINUR NEGATIVE 11/18/2019 0910   KETONESUR NEGATIVE 11/18/2019 0910   PROTEINUR NEGATIVE 11/18/2019 0910   NITRITE NEGATIVE 11/18/2019 0910   LEUKOCYTESUR NEGATIVE 11/18/2019 0910    Radiological Exams on Admission: DG Chest 2 View  Result Date: 11/25/2019 CLINICAL DATA:  Left-sided chest pain EXAM: CHEST - 2 VIEW COMPARISON:  11/18/2019 FINDINGS: Post CABG. The heart size and mediastinal contours are within normal limits. No focal airspace consolidation, pleural effusion, or pneumothorax. Tendon anchors within the right humeral head. IMPRESSION: No active cardiopulmonary disease.  Electronically Signed   By: Davina Poke D.O.   On: 11/25/2019 11:54    EKG: Independently reviewed.  Normal sinus rhythm  Assessment/Plan Principal Problem:   Unstable angina (HCC) Active Problems:   HTN (hypertension)   GERD (gastroesophageal reflux disease)   CAD (coronary artery disease)   Uncontrolled secondary diabetes mellitus with stage 3 CKD (GFR 30-59) (HCC)   AKI (acute kidney injury) ([place])      Unstable angina In a patient with known coronary artery disease status post CABG We will obtain serial cardiac enzymes Place patient on Lovenox, nitrates, aspirin, statins and beta-blockers We will request cardiology consult   Hypertension Blood pressure stable Continue metoprolol   Acute kidney injury With mild hyperkalemia Patient has a baseline serum creatinine of 1.3 and on admission today it is 2.03 Hold lisinopril for now    Diabetes mellitus with complications of which 3 chronic kidney disease Maintain consistent carbohydrate diet Glycemic control with insulin   GERD Continue PPI   Morbid obesity (BMI 38) Complicates overall prognosis and care  DVT prophylaxis: Lovenox Code Status: Full code Family Communication: Greater than 50% of time was spent discussing plan of care with patient at the bedside.  He verbalizes understanding and agrees with the plan. Disposition Plan: Back to previous home environment Consults called: Cardiology    Collier Bullock MD [facility]     11/25/2019, 4:46 PM

## 2019-11-25 NOTE — Progress Notes (Signed)
Anticoagulation monitoring(Lovenox):  74 yo  male ordered Lovenox 30 mg Q24h  Filed Weights   11/25/19 1110  Weight: 113.4 kg (250 lb)   Body mass index is 38.01 kg/m.   Lab Results  Component Value Date   CREATININE 2.03 (H) 11/25/2019   CREATININE 2.16 (H) 11/18/2019   CREATININE 1.33 (H) 07/24/2019   Estimated Creatinine Clearance: 39.6 mL/min (A) (by C-G formula based on SCr of 2.03 mg/dL (H)). Hemoglobin & Hematocrit     Component Value Date/Time   HGB 11.4 (L) 11/25/2019 1118   HGB 13.2 04/09/2014 1402   HCT 32.8 (L) 11/25/2019 1118   HCT 39.4 (L) 04/09/2014 1402     Per Protocol for Patient with estCrcl > 30 ml/min and BMI < 40, will transition to Lovenox 40 mg Q24h.

## 2019-11-25 NOTE — Consult Note (Signed)
Cardiology Consultation Note    Patient ID: Justin Keith, MRN: [id number], DOB/AGE: 1945-07-24 74 y.o. Admit date: 11/25/2019   Date of Consult: 11/25/2019 Primary Physician: [facility] [place] Primary Cardiologist: Dr. Clayborn Bigness  Chief Complaint: chest pain Reason for Consultation: chest pain Requesting MD: Dr. Francine Graven  HPI: Justin Keith is a 74 y.o. male with history of diffuse 3 vessel cad s/p cabg with a lima to the lad, svg to om1, svg to rca. Cath done in 2019 revealed widely patent lima to lad and patent svg to om and rca. He also has history of gerd, hyperlipidemia. Had chest pain while doing yard work. Minimal troponin levels thus far 12 and 63 and 500. Marland Kitchen EKG showed nsr with no ischemia. Has been treated with amlodipine 10, asa, atorvastatin 80 daily, lisinopril 10 daily, and sl ntg. He states he has been compliant with his meds. He states the pain is slightly increased with deep palpation but also is worse with exertion. He describes a squeezing sensation.   Past Medical History:  Diagnosis Date  . CAD (coronary artery disease)   . CKD (chronic kidney disease)   . Diabetes mellitus without complication ([place])   . GERD (gastroesophageal reflux disease)   . Hyperlipemia   . Hypertension       Surgical History:  Past Surgical History:  Procedure Laterality Date  . CARDIAC CATHETERIZATION    . CARDIAC SURGERY    . COLONOSCOPY WITH PROPOFOL N/A 02/25/2018   Procedure: COLONOSCOPY WITH PROPOFOL;  Surgeon: Jonathon Bellows, MD;  Location: [facility] ENDOSCOPY;  Service: Gastroenterology;  Laterality: N/A;  . COLONOSCOPY WITH PROPOFOL N/A 02/26/2018   Procedure: COLONOSCOPY WITH PROPOFOL;  Surgeon: Jonathon Bellows, MD;  Location: [facility] ENDOSCOPY;  Service: Gastroenterology;  Laterality: N/A;  . ESOPHAGOGASTRODUODENOSCOPY (EGD) WITH PROPOFOL N/A 01/18/2019   Procedure: ESOPHAGOGASTRODUODENOSCOPY (EGD) WITH PROPOFOL;  Surgeon: Lucilla Lame, MD;  Location: [facility] ENDOSCOPY;   Service: Endoscopy;  Laterality: N/A;  . FOREIGN BODY REMOVAL N/A 03/07/2018   Procedure: FOREIGN BODY REMOVAL;  Surgeon: Jonathon Bellows, MD;  Location: [facility] ENDOSCOPY;  Service: Gastroenterology;  Laterality: N/A;  . LEFT HEART CATH AND CORS/GRAFTS ANGIOGRAPHY N/A 08/07/2017   Procedure: LEFT HEART CATH AND CORS/GRAFTS ANGIOGRAPHY;  Surgeon: Yolonda Kida, MD;  Location: [place] CV LAB;  Service: Cardiovascular;  Laterality: N/A;  . PROSTATE SURGERY    . SHOULDER ARTHROSCOPY       Home Meds: Prior to Admission medications   Medication Sig Start Date End Date Taking? Authorizing Provider  amLODipine (NORVASC) 10 MG tablet Take 1 tablet (10 mg total) by mouth daily. 12/08/15  Yes Vaughan Basta, MD  aspirin 81 MG chewable tablet Chew 1 tablet (81 mg total) by mouth daily. 12/08/15  Yes Vaughan Basta, MD  atorvastatin (LIPITOR) 80 MG tablet Take 80 mg by mouth daily.    Yes [provider]  lisinopril (PRINIVIL,ZESTRIL) 10 MG tablet Take 1 tablet (10 mg total) by mouth daily. 12/08/15  Yes Vaughan Basta, MD  metFORMIN (GLUCOPHAGE-XR) 500 MG 24 hr tablet Take 1,000 mg by mouth daily with breakfast.    Yes [provider]  nitroGLYCERIN (NITROSTAT) 0.4 MG SL tablet Place 0.4 mg under the tongue every 5 (five) minutes as needed for chest pain.   Yes [provider]  pantoprazole (PROTONIX) 40 MG tablet Take 1 tablet (40 mg total) by mouth daily. 01/18/19 01/18/20 Yes Lucilla Lame, MD    Inpatient Medications:  . aspirin  324 mg  Oral NOW   Or  . aspirin  300 mg Rectal NOW  . [START ON 11/26/2019] aspirin  81 mg Oral Daily  . [START ON 11/26/2019] atorvastatin  80 mg Oral Daily  . enoxaparin (LOVENOX) injection  30 mg Subcutaneous Q24H  . insulin aspart  0-20 Units Subcutaneous TID WC  . metoprolol tartrate  25 mg Oral BID  . nitroGLYCERIN  0.5 inch Topical Q6H  . [START ON 11/26/2019] pantoprazole  40 mg Oral Daily     Allergies: No Known  Allergies  Social History   Socioeconomic History  . Marital status: Single    Spouse name: Not on file  . Number of children: Not on file  . Years of education: Not on file  . Highest education level: Not on file  Occupational History  . Not on file  Tobacco Use  . Smoking status: Never Smoker  . Smokeless tobacco: Current User    Types: Chew  Vaping Use  . Vaping Use: Never used  Substance and Sexual Activity  . Alcohol use: No  . Drug use: Not Currently  . Sexual activity: Not on file  Other Topics Concern  . Not on file  Social History Narrative  . Not on file   Social Determinants of Health   Financial Resource Strain:   . Difficulty of Paying Living Expenses:   Food Insecurity:   . Worried About Charity fundraiser in the Last Year:   . Arboriculturist in the Last Year:   Transportation Needs:   . Film/video editor (Medical):   Marland Kitchen Lack of Transportation (Non-Medical):   Physical Activity:   . Days of Exercise per Week:   . Minutes of Exercise per Session:   Stress:   . Feeling of Stress :   Social Connections:   . Frequency of Communication with Friends and Family:   . Frequency of Social Gatherings with Friends and Family:   . Attends Religious Services:   . Active Member of Clubs or Organizations:   . Attends Archivist Meetings:   Marland Kitchen Marital Status:   Intimate Partner Violence:   . Fear of Current or Ex-Partner:   . Emotionally Abused:   Marland Kitchen Physically Abused:   . Sexually Abused:      History reviewed. No pertinent family history.   Review of Systems: A 12-system review of systems was performed and is negative except as noted in the HPI.  Labs: No results for input(s): CKTOTAL, CKMB, TROPONINI in the last 72 hours. Lab Results  Component Value Date   WBC 10.1 11/25/2019   HGB 11.4 (L) 11/25/2019   HCT 32.8 (L) 11/25/2019   MCV 84.5 11/25/2019   PLT 290 11/25/2019    Recent Labs  Lab 11/25/19 1118  NA 130*  K 5.3*  CL 99   CO2 22  BUN 27*  CREATININE 2.03*  CALCIUM 9.8  GLUCOSE 365*   Lab Results  Component Value Date   CHOL 170 03/15/2019   HDL 44 03/15/2019   LDLCALC 103 (H) 03/15/2019   TRIG 115 03/15/2019   No results found for: DDIMER  Radiology/Studies:  DG Chest 2 View  Result Date: 11/25/2019 CLINICAL DATA:  Left-sided chest pain EXAM: CHEST - 2 VIEW COMPARISON:  11/18/2019 FINDINGS: Post CABG. The heart size and mediastinal contours are within normal limits. No focal airspace consolidation, pleural effusion, or pneumothorax. Tendon anchors within the right humeral head. IMPRESSION: No active cardiopulmonary disease. Electronically Signed  By: Davina Poke D.O.   On: 11/25/2019 11:54   DG Chest 2 View  Result Date: 11/18/2019 CLINICAL DATA:  Chest pain. EXAM: CHEST - 2 VIEW COMPARISON:  July 24, 2019 FINDINGS: Sternotomy wires are intact. The heart, hila, mediastinum, lungs, and pleura are normal. No cause for chest pain identified. IMPRESSION: No active cardiopulmonary disease. Electronically Signed   By: Dorise Bullion III M.D   On: 11/18/2019 09:46    Wt Readings from Last 3 Encounters:  11/25/19 113.4 kg  11/18/19 117 kg  07/24/19 120.2 kg    EKG: nsr with no ischemia  Physical Exam:African American male complaining of chest pain Blood pressure 139/71, pulse 68, temperature 97.8 F (36.6 C), temperature source Oral, resp. rate 15, height 5\' 8"  (1.727 m), weight 113.4 kg, SpO2 97 %. Body mass index is 38.01 kg/m. General: Well developed, well nourished, in no acute distress. Head: Normocephalic, atraumatic, sclera non-icteric, no xanthomas, nares are without discharge.  Neck: Negative for carotid bruits. JVD not elevated. Lungs: Clear bilaterally to auscultation without wheezes, rales, or rhonchi. Breathing is unlabored. Heart: RRR with S1 S2. No murmurs, rubs, or gallops appreciated. Abdomen: Soft, non-tender, non-distended with normoactive bowel sounds. No  hepatomegaly. No rebound/guarding. No obvious abdominal masses. Msk:  Strength and tone appear normal for age. Extremities: No clubbing or cyanosis. No edema.  Distal pedal pulses are 2+ and equal bilaterally. Neuro: Alert and oriented X 3. No facial asymmetry. No focal deficit. Moves all extremities spontaneously. Psych:  Responds to questions appropriately with a normal affect.     Assessment and Plan  74 yo male with history of cad s/p cabg with a lima to lad, svg to om and rca who had cath several years ago showing widely patent bypass grafts. Now with chest pain with both typical and atypical pain. Troponin has increased to 500. EKG unremarkable. Will treat with iv heparin and conitnue asa, atorvastatin, lisinopril amlodipine . Will conitnue t rule out for mi. Will follow on meds. Further invasive vs noninvasive cardiac evaluation pending based on course.   Signed, Teodoro Spray MD 11/25/2019, 4:58 PM Pager: [phone]

## 2019-11-25 NOTE — Progress Notes (Signed)
Patient admitted to unit. Oriented to room, call bell, and staff. Bed in lowest position. Fall safety plan reviewed. Full assessment to Epic. Skin assessment verified with Illene Bolus RN. Telemetry box verification with tele clerk- Box#: [id number]. Will continue to monitor.  Admitted for chest pain. Reports pain has currently subsided but seems to be intermittent, peaking at 9/10 "grabbing" pain. Dr. Ubaldo Glassing and Dr. Francine Graven notified of increase in troponin.

## 2019-11-25 NOTE — ED Notes (Signed)
Says his chest pain is grabbing pain left chest that comes a goes.  When it comes it stays about 40minutes.  This started today while mowing lawn.  No distress.

## 2019-11-25 NOTE — ED Notes (Signed)
Date and time results received: 11/25/19 1447   Test: troponin Critical Value: 43  Name of Provider Notified: Dr. Cherylann Banas

## 2019-11-26 ENCOUNTER — Other Ambulatory Visit: Payer: Self-pay

## 2019-11-26 ENCOUNTER — Encounter: Payer: Self-pay | Admitting: Internal Medicine

## 2019-11-26 DIAGNOSIS — E1165 Type 2 diabetes mellitus with hyperglycemia: Secondary | ICD-10-CM | POA: Diagnosis present

## 2019-11-26 DIAGNOSIS — I10 Essential (primary) hypertension: Secondary | ICD-10-CM

## 2019-11-26 DIAGNOSIS — E875 Hyperkalemia: Secondary | ICD-10-CM | POA: Diagnosis present

## 2019-11-26 DIAGNOSIS — E1322 Other specified diabetes mellitus with diabetic chronic kidney disease: Secondary | ICD-10-CM | POA: Diagnosis not present

## 2019-11-26 DIAGNOSIS — Z6838 Body mass index (BMI) 38.0-38.9, adult: Secondary | ICD-10-CM | POA: Diagnosis not present

## 2019-11-26 DIAGNOSIS — N179 Acute kidney failure, unspecified: Secondary | ICD-10-CM | POA: Diagnosis present

## 2019-11-26 DIAGNOSIS — Z951 Presence of aortocoronary bypass graft: Secondary | ICD-10-CM | POA: Diagnosis not present

## 2019-11-26 DIAGNOSIS — I25110 Atherosclerotic heart disease of native coronary artery with unstable angina pectoris: Secondary | ICD-10-CM | POA: Diagnosis present

## 2019-11-26 DIAGNOSIS — Z7984 Long term (current) use of oral hypoglycemic drugs: Secondary | ICD-10-CM | POA: Diagnosis not present

## 2019-11-26 DIAGNOSIS — I214 Non-ST elevation (NSTEMI) myocardial infarction: Secondary | ICD-10-CM | POA: Diagnosis present

## 2019-11-26 DIAGNOSIS — R001 Bradycardia, unspecified: Secondary | ICD-10-CM | POA: Diagnosis present

## 2019-11-26 DIAGNOSIS — N183 Chronic kidney disease, stage 3 unspecified: Secondary | ICD-10-CM

## 2019-11-26 DIAGNOSIS — N1832 Chronic kidney disease, stage 3b: Secondary | ICD-10-CM | POA: Diagnosis present

## 2019-11-26 DIAGNOSIS — Z20822 Contact with and (suspected) exposure to covid-19: Secondary | ICD-10-CM | POA: Diagnosis present

## 2019-11-26 DIAGNOSIS — K219 Gastro-esophageal reflux disease without esophagitis: Secondary | ICD-10-CM | POA: Diagnosis present

## 2019-11-26 DIAGNOSIS — I429 Cardiomyopathy, unspecified: Secondary | ICD-10-CM | POA: Diagnosis present

## 2019-11-26 DIAGNOSIS — I129 Hypertensive chronic kidney disease with stage 1 through stage 4 chronic kidney disease, or unspecified chronic kidney disease: Secondary | ICD-10-CM | POA: Diagnosis present

## 2019-11-26 DIAGNOSIS — Z79899 Other long term (current) drug therapy: Secondary | ICD-10-CM | POA: Diagnosis not present

## 2019-11-26 DIAGNOSIS — Z7982 Long term (current) use of aspirin: Secondary | ICD-10-CM | POA: Diagnosis not present

## 2019-11-26 DIAGNOSIS — E785 Hyperlipidemia, unspecified: Secondary | ICD-10-CM | POA: Diagnosis present

## 2019-11-26 DIAGNOSIS — E1122 Type 2 diabetes mellitus with diabetic chronic kidney disease: Secondary | ICD-10-CM | POA: Diagnosis present

## 2019-11-26 DIAGNOSIS — E1365 Other specified diabetes mellitus with hyperglycemia: Secondary | ICD-10-CM

## 2019-11-26 LAB — CBC
HCT: 30.8 % — ABNORMAL LOW (ref 39.0–52.0)
Hemoglobin: 11 g/dL — ABNORMAL LOW (ref 13.0–17.0)
MCH: 29.8 pg (ref 26.0–34.0)
MCHC: 35.7 g/dL (ref 30.0–36.0)
MCV: 83.5 fL (ref 80.0–100.0)
Platelets: 277 10*3/uL (ref 150–400)
RBC: 3.69 MIL/uL — ABNORMAL LOW (ref 4.22–5.81)
RDW: 13.9 % (ref 11.5–15.5)
WBC: 10.1 10*3/uL (ref 4.0–10.5)
nRBC: 0 % (ref 0.0–0.2)

## 2019-11-26 LAB — BASIC METABOLIC PANEL
Anion gap: 8 (ref 5–15)
BUN: 30 mg/dL — ABNORMAL HIGH (ref 8–23)
CO2: 22 mmol/L (ref 22–32)
Calcium: 9.3 mg/dL (ref 8.9–10.3)
Chloride: 104 mmol/L (ref 98–111)
Creatinine, Ser: 2.22 mg/dL — ABNORMAL HIGH (ref 0.61–1.24)
GFR calc Af Amer: 33 mL/min — ABNORMAL LOW (ref >60)
GFR calc non Af Amer: 28 mL/min — ABNORMAL LOW (ref >60)
Glucose, Bld: 190 mg/dL — ABNORMAL HIGH (ref 70–99)
Potassium: 4.8 mmol/L (ref 3.5–5.1)
Sodium: 134 mmol/L — ABNORMAL LOW (ref 135–145)

## 2019-11-26 LAB — GLUCOSE, CAPILLARY
Glucose-Capillary: 188 mg/dL — ABNORMAL HIGH (ref 70–99)
Glucose-Capillary: 227 mg/dL — ABNORMAL HIGH (ref 70–99)
Glucose-Capillary: 217 mg/dL — ABNORMAL HIGH (ref 70–99)
Glucose-Capillary: 212 mg/dL — ABNORMAL HIGH (ref 70–99)

## 2019-11-26 LAB — LIPID PANEL
Cholesterol: 166 mg/dL (ref 0–200)
HDL: 40 mg/dL — ABNORMAL LOW (ref >40)
LDL Cholesterol: 89 mg/dL (ref 0–99)
Total CHOL/HDL Ratio: 4.2 RATIO
Triglycerides: 183 mg/dL — ABNORMAL HIGH (ref <150)
VLDL: 37 mg/dL (ref 0–40)

## 2019-11-26 LAB — HEPARIN LEVEL (UNFRACTIONATED)
Heparin Unfractionated: 1.06 IU/mL — ABNORMAL HIGH (ref 0.30–0.70)
Heparin Unfractionated: 0.72 IU/mL — ABNORMAL HIGH (ref 0.30–0.70)
Heparin Unfractionated: 0.65 IU/mL (ref 0.30–0.70)

## 2019-11-26 MED ORDER — LACTATED RINGERS IV SOLN: INTRAVENOUS | Status: DC

## 2019-11-26 MED ORDER — INSULIN GLARGINE 100 UNIT/ML ~~LOC~~ SOLN
20.0000 [IU] | Freq: Every day | SUBCUTANEOUS | Status: DC
  Administered 2019-11-26 – 2019-11-28 (×2): 20 [IU] via SUBCUTANEOUS
  Filled 2019-11-26 (×4): qty 0.2

## 2019-11-26 MED ORDER — HEPARIN (PORCINE) 25000 UT/250ML-% IV SOLN
800.0000 [IU]/h | INTRAVENOUS | Status: DC
  Administered 2019-11-26: 1000 [IU]/h via INTRAVENOUS
  Administered 2019-11-27: 800 [IU]/h via INTRAVENOUS
  Filled 2019-11-26 (×2): qty 250

## 2019-11-26 MED ORDER — SODIUM CHLORIDE 0.9% FLUSH
3.0000 mL | Freq: Two times a day (BID) | INTRAVENOUS | Status: DC
  Administered 2019-11-28 (×2): 3 mL via INTRAVENOUS

## 2019-11-26 NOTE — Progress Notes (Signed)
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 male with history of diffuse 3 vessel cad s/p cabg with a lima to the lad, svg to om1, svg to rca. Cath done in 2019 revealed widely patent lima to lad and patent svg to om and rca. He also has history of gerd, hyperlipidemia. Had chest pain while doing yard work. Minimal troponin levels thus far 12 and 63 and 455 and 793 . EKG showed nsr with no ischemia. Has been treated with amlodipine 10, asa, atorvastatin 80 daily, lisinopril 10 daily, and sl ntg. He states he has been compliant with his meds. He states the pain is slightly increased with deep palpation but also is worse with exertion. He describes a squeezing sensation  Subjective   No further chest pain. DIscussed diagnosis of probable nstemi and risk and benefits of cardiac cath with patient and his family member.   Inpatient Medications    . aspirin  324 mg Oral NOW   Or  . aspirin  300 mg Rectal NOW  . aspirin  81 mg Oral Daily  . atorvastatin  80 mg Oral Daily  . insulin aspart  0-20 Units Subcutaneous TID WC  . insulin aspart  0-5 Units Subcutaneous QHS  . metoprolol tartrate  25 mg Oral BID  . nitroGLYCERIN  0.5 inch Topical Q6H  . pantoprazole  40 mg Oral Daily  . sodium chloride flush  3 mL Intravenous Q12H    Vital Signs    Vitals:   11/25/19 1915 11/25/19 2141 11/26/19 0531 11/26/19 0801  BP: (!) 160/115 117/68 114/63 116/70  Pulse: (!) 58 65 (!) 56 (!) 57  Resp: 18  18 17   Temp: 98.4 F (36.9 C)  98.7 F (37.1 C) 97.9 F (36.6 C)  TempSrc: Oral  Oral Oral  SpO2: 100% 98% 98% 99%  Weight:   108.9 kg   Height:         Intake/Output Summary (Last 24 hours) at 11/26/2019 0942 Last data filed at 11/26/2019 0801 Gross per 24 hour  Intake --  Output 350 ml  Net -350 ml   Filed Weights   11/25/19 1110 11/25/19 1800 11/26/19 0531  Weight: 113.4 kg 108.1 kg 108.9 kg    Physical Exam    GEN: Well nourished, well developed, in no acute distress.  HEENT: normal.  Neck: Supple, no JVD, carotid bruits, or masses. Cardiac: RRR, no murmurs, rubs, or gallops. No clubbing, cyanosis, edema.  Radials/DP/PT 2+ and equal bilaterally.  Respiratory:  Respirations regular and unlabored, clear to auscultation bilaterally. GI: Soft, nontender, nondistended, BS + x 4. MS: no deformity or atrophy. Skin: warm and dry, no rash. Neuro:  Strength and sensation are intact. Psych: Normal affect.  Labs    CBC Recent Labs    11/25/19 1118 11/26/19 0346  WBC 10.1 10.1  HGB 11.4* 11.0*  HCT 32.8* 30.8*  MCV 84.5 83.5  PLT 290 109   Basic Metabolic Panel Recent Labs    11/25/19 1118 11/26/19 0346  NA 130* 134*  K 5.3* 4.8  CL 99 104  CO2 22 22  GLUCOSE  365* 190*  BUN 27* 30*  CREATININE 2.03* 2.22*  CALCIUM 9.8 9.3   Liver Function Tests No results for input(s): AST, ALT, ALKPHOS, BILITOT, PROT, ALBUMIN in the last 72 hours. No results for input(s): LIPASE, AMYLASE in the last 72 hours. Cardiac Enzymes No results for input(s): CKTOTAL, CKMB, CKMBINDEX, TROPONINI in the last 72 hours. BNP No results for input(s): BNP in the last 72 hours. D-Dimer No results for input(s): DDIMER in the last 72 hours. Hemoglobin A1C Recent Labs    11/25/19 1716  HGBA1C 12.8*   Fasting Lipid Panel Recent Labs    11/26/19 0346  CHOL 166  HDL 40*  LDLCALC 89  TRIG 183*  CHOLHDL 4.2   Thyroid Function Tests No results for input(s): TSH, T4TOTAL, T3FREE, THYROIDAB in the last 72 hours.  Invalid input(s): FREET3  Telemetry    Normal sinus rhythm with no arrhythmia  ECG    Normal sinus rhythm with no  ischemia  Radiology    DG Chest 2 View  Result Date: 11/25/2019 CLINICAL DATA:  Left-sided chest pain EXAM: CHEST - 2 VIEW COMPARISON:  11/18/2019 FINDINGS: Post CABG. The heart size and mediastinal contours are within normal limits. No focal airspace consolidation, pleural effusion, or pneumothorax. Tendon anchors within the right humeral head. IMPRESSION: No active cardiopulmonary disease. Electronically Signed   By: Davina Poke D.O.   On: 11/25/2019 11:54   DG Chest 2 View  Result Date: 11/18/2019 CLINICAL DATA:  Chest pain. EXAM: CHEST - 2 VIEW COMPARISON:  July 24, 2019 FINDINGS: Sternotomy wires are intact. The heart, hila, mediastinum, lungs, and pleura are normal. No cause for chest pain identified. IMPRESSION: No active cardiopulmonary disease. Electronically Signed   By: Dorise Bullion III M.D   On: 11/18/2019 09:46    Assessment & Plan    74 yo male with history of cad s/p cabg with a lima to lad, svg to om and rca who had cath several years ago showing widely patent bypass grafts. Now with chest pain with both typical and atypical pain. Troponin has increased to greater than 700.  No obvious EKG changes.  Appears to have ruled in for non-ST elevation myocardial infarction.  1.  Coronary artery disease-status post carotid bypass grafting with a LIMA to the LAD, saphenous vein graft to OM and RCA with patent grafts by cath several years ago now admitted with chest pain and was ruled in for non-ST elevation myocardial infarction.  Currently hemodynamically stable and pain-free.  Will need to proceed with left heart cath and continue with heparin over the weekend.  Risk benefits of heart catheter explained to the patient he agrees to proceed.  Further recommendations after cardiac cath.  We will continue with current regimen for now.  Signed, Javier Docker Abriella Filkins MD 11/26/2019, 9:42 AM  Pager: [phone]

## 2019-11-26 NOTE — Progress Notes (Signed)
ANTICOAGULATION CONSULT NOTE -  Pharmacy Consult for heparin  Indication: chest pain/ACS  No Known Allergies  Patient Measurements: Height: 5\' 8"  (172.7 cm) Weight: 108.1 kg (238 lb 6.4 oz) IBW/kg (Calculated) : 68.4 Heparin Dosing Weight: 92.3 kg  Vital Signs: Temp: 98.4 F (36.9 C) (06/25 1915) Temp Source: Oral (06/25 1915) BP: 117/68 (06/25 2141) Pulse Rate: 65 (06/25 2141)  Labs: Recent Labs    11/25/19 1118 11/25/19 1118 11/25/19 1350 11/25/19 1716 11/25/19 1846 11/26/19 0346  HGB 11.4*  --   --   --   --  11.0*  HCT 32.8*  --   --   --   --  30.8*  PLT 290  --   --   --   --  277  APTT  --   --   --   --  29  --   LABPROT  --   --   --   --  12.6  --   INR  --   --   --   --  1.0  --   HEPARINUNFRC  --   --   --   --   --  1.06*  CREATININE 2.03*  --   --   --   --  2.22*  TROPONINIHS 12   < > 63* 455* 793*  --    < > = values in this interval not displayed.    Estimated Creatinine Clearance: 35.3 mL/min (A) (by C-G formula based on SCr of 2.22 mg/dL (H)).   Medical History: Past Medical History:  Diagnosis Date  . CAD (coronary artery disease)   . CKD (chronic kidney disease)   . Diabetes mellitus without complication ([place])   . GERD (gastroesophageal reflux disease)   . Hyperlipemia   . Hypertension    Assessment: 74 yo male here with chest pain and troponins trending up to start on heparin drip. No oral anticoagulants noted on PTA med list.   Goal of Therapy:  Heparin level 0.3-0.7 units/ml Monitor platelets by anticoagulation protocol: Yes   Plan:  D/c ppx enoxaparin Stat INR and aPTT baseline labs  Give 4000 units bolus x 1 Start heparin infusion at 1200 units/hr Check anti-Xa level in 8 hours and daily while on heparin Continue to monitor H&H and platelets - CBC in AM.   0626 0346 HL 1.06, SUPRAtherapeutic.  Will hold heparin x 1 hour then resume at 1000 units/hr, recheck HL 8 hrs after infusion resumed.   Pharmacy will continue to  follow.   [place] Crane, Reiko Vinje A 11/26/2019,4:22 AM

## 2019-11-26 NOTE — Plan of Care (Signed)
  Problem: Education: Goal: Knowledge of General Education information will improve Description: Including pain rating scale, medication(s)/side effects and non-pharmacologic comfort measures Outcome: Progressing   Problem: Clinical Measurements: Goal: Will remain free from infection Outcome: Progressing Goal: Respiratory complications will improve Outcome: Progressing   Problem: Activity: Goal: Risk for activity intolerance will decrease Outcome: Progressing   Problem: Pain Managment: Goal: General experience of comfort will improve Outcome: Progressing   Problem: Safety: Goal: Ability to remain free from injury will improve Outcome: Progressing   Problem: Activity: Goal: Ability to tolerate increased activity will improve Outcome: Progressing   Problem: Cardiac: Goal: Ability to achieve and maintain adequate cardiovascular perfusion will improve Outcome: Progressing

## 2019-11-26 NOTE — Plan of Care (Signed)
  Problem: Education: Goal: Knowledge of General Education information will improve Description: Including pain rating scale, medication(s)/side effects and non-pharmacologic comfort measures Outcome: Progressing   Problem: Activity: Goal: Risk for activity intolerance will decrease Outcome: Progressing   Problem: Safety: Goal: Ability to remain free from injury will improve Outcome: Progressing   Problem: Skin Integrity: Goal: Risk for impaired skin integrity will decrease Outcome: Progressing   Problem: Activity: Goal: Ability to tolerate increased activity will improve Outcome: Progressing

## 2019-11-26 NOTE — Progress Notes (Addendum)
Progress Note    ERVAN HEBER  WRU:[id number] DOB: June 11, 1945  DOA: 11/25/2019 PCP: [facility]      Brief Narrative:    Medical records reviewed and are as summarized below:  Justin Keith is a 74 y.o. male with medical history significant for hypertension, type 2 diabetes mellitus, CAD with history of CABG, CAD, hyperlipidemia, CKD stage IIIb, cardiomyopathy, presented to the hospital because of left-sided chest.  That developed while he was doing some yard work.  His troponins were elevated on admission.  He was admitted to the hospital for acute NSTEMI.  He was treated with IV heparin infusion, aspirin, metoprolol and Lipitor.  He was seen in consultation with a cardiologist.    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  Continue metoprolol, aspirin and Lipitor.  Patient has been evaluated by the cardiologist with plan for left heart catheterization on 11/28/2019.   Type 2 diabetes mellitus with hyperglycemia: Hemoglobin A1c 12.8.  Treat with Lantus.  NovoLog as needed for hyperglycemia.  Metformin is on hold.  Close follow diabetes coordinator and dietitian for further counseling.  Hypertension: Amlodipine on hold.  Continue metoprolol  Hyperlipidemia: Continue Lipitor. LDL is 89, HDL 40 total cholesterol 166 and triglycerides 183.  CKD stage IIIb, probable AKI: Creatinine was 1.33 in February 2021.  Creatinine was 1.6 on 11/18/2019. Hydrate with IV fluids and monitor BMP.   Body mass index is 36.49 kg/m.  (Obesity)  Diet Order            Diet  Carb Modified Fluid consistency: Thin; Room service appropriate? Yes  Diet effective now                       Medications:   . aspirin  324 mg Oral NOW   Or  . aspirin  300 mg Rectal NOW  . aspirin  81 mg Oral Daily  . atorvastatin  80 mg Oral Daily  . insulin aspart  0-20 Units Subcutaneous TID WC  . insulin aspart  0-5 Units Subcutaneous QHS  . insulin glargine  20 Units Subcutaneous QHS  . metoprolol tartrate  25 mg Oral BID  . nitroGLYCERIN  0.5 inch Topical Q6H  . pantoprazole  40 mg Oral Daily  . sodium chloride flush  3 mL Intravenous Q12H   Continuous Infusions: . heparin 1,000 Units/hr (11/26/19 0545)  . lactated ringers       Anti-infectives (From admission, onward)   None             Family Communication/Anticipated D/C date and plan/Code Status   DVT prophylaxis:      Code Status: Full Code  Family Communication: Plan discussed with his brother at the bedside Disposition Plan:    Status is: Observation  The patient will require care spanning > 2 midnights and should be moved to inpatient because: IV treatments appropriate due to intensity of illness or inability to take PO and Inpatient level of care appropriate due to severity of illness  Dispo: The patient is from: Home  Anticipated d/c is to: Home              Anticipated d/c date is: 3 days              Patient currently is not medically stable to d/c.           Subjective:   No chest pain or shortness of breath  Objective:    Vitals:   11/25/19 2141 11/26/19 0531 11/26/19 0801 11/26/19 1128  BP: 117/68 114/63 116/70 103/64  Pulse: 65 (!) 56 (!) 57 (!) 58  Resp:  18 17 17   Temp:  98.7 F (37.1 C) 97.9 F (36.6 C) 98.1 F (36.7 C)  TempSrc:  Oral Oral Oral  SpO2: 98% 98% 99% 97%  Weight:  108.9 kg    Height:       No data found.   Intake/Output Summary (Last 24 hours) at 11/26/2019 1416 Last data filed at 11/26/2019 0801 Gross per 24 hour   Intake --  Output 350 ml  Net -350 ml   Filed Weights   11/25/19 1110 11/25/19 1800 11/26/19 0531  Weight: 113.4 kg 108.1 kg 108.9 kg    Exam:  GEN: NAD SKIN: No rash EYES: EOMI ENT: MMM CV: RRR PULM: CTA B ABD: soft, ND, NT, +BS CNS: AAO x 3, non focal EXT: No edema or tenderness     Data Reviewed:   I have personally reviewed following labs and imaging studies:  Labs: Labs show the following:   Basic Metabolic Panel: Recent Labs  Lab 11/25/19 1118 11/26/19 0346  NA 130* 134*  K 5.3* 4.8  CL 99 104  CO2 22 22  GLUCOSE 365* 190*  BUN 27* 30*  CREATININE 2.03* 2.22*  CALCIUM 9.8 9.3   GFR Estimated Creatinine Clearance: 35.5 mL/min (A) (by C-G formula based on SCr of 2.22 mg/dL (H)). Liver Function Tests: No results for input(s): AST, ALT, ALKPHOS, BILITOT, PROT, ALBUMIN in the last 168 hours. No results for input(s): LIPASE, AMYLASE in the last 168 hours. No results for input(s): AMMONIA in the last 168 hours. Coagulation profile Recent Labs  Lab 11/25/19 1846  INR 1.0    CBC: Recent Labs  Lab 11/25/19 1118 11/26/19 0346  WBC 10.1 10.1  HGB 11.4* 11.0*  HCT 32.8* 30.8*  MCV 84.5 83.5  PLT 290 277   Cardiac Enzymes: No results for input(s): CKTOTAL, CKMB, CKMBINDEX, TROPONINI in the last 168 hours. BNP (last 3 results) No results for input(s): PROBNP in the last 8760 hours. CBG: Recent Labs  Lab 11/25/19 1805 11/25/19 2114 11/26/19 0804 11/26/19 1129  GLUCAP 198* 266* 188* 227*   D-Dimer: No results for input(s): DDIMER in the last 72 hours. Hgb A1c: Recent Labs    11/25/19 1716  HGBA1C 12.8*   Lipid Profile: Recent Labs    11/26/19 0346  CHOL 166  HDL 40*  LDLCALC 89  TRIG 183*  CHOLHDL 4.2   Thyroid function studies: No results for input(s): TSH, T4TOTAL, T3FREE, THYROIDAB in the last 72 hours.  Invalid input(s): FREET3 Anemia work up: No results for input(s): VITAMINB12, FOLATE, FERRITIN, TIBC, IRON,  RETICCTPCT in the last 72 hours. Sepsis Labs: Recent Labs  Lab 11/25/19 1118 11/26/19 0346  WBC 10.1 10.1    Microbiology Recent Results (from the past 240 hour(s))  SARS Coronavirus 2 by RT PCR (hospital order, performed in [facility] hospital lab) Nasopharyngeal Nasopharyngeal Swab     Status: None   Collection Time: 11/25/19  3:00 PM   Specimen: Nasopharyngeal Swab  Result Value Ref Range Status   SARS Coronavirus 2 NEGATIVE NEGATIVE Final    Comment: (NOTE) SARS-CoV-2 target nucleic acids are NOT DETECTED.  The SARS-CoV-2 RNA is generally detectable in upper and lower respiratory specimens during the acute phase of infection. The lowest concentration of SARS-CoV-2 viral copies this assay can detect is 250 copies / mL. A negative result does not preclude SARS-CoV-2 infection and should not be used as the sole basis for treatment or other patient management decisions.  A negative result may occur with improper specimen collection / handling, submission of specimen other than nasopharyngeal swab, presence of viral mutation(s) within the areas targeted by this assay, and inadequate number of viral copies (<250 copies / mL). A negative result must be combined with clinical observations, patient history, and epidemiological information.  Fact Sheet for Patients:   StrictlyIdeas.no  Fact Sheet for Healthcare Providers: BankingDealers.co.za  This test is not yet approved or  cleared by the [place] FDA and has been authorized for detection and/or diagnosis of SARS-CoV-2 by FDA under an Emergency Use Authorization (EUA).  This EUA will remain in effect (meaning this test can be used) for the duration of the COVID-19 declaration under Section 564(b)(1) of the Act, 21 U.S.C. section 360bbb-3(b)(1), unless the authorization is terminated or revoked sooner.  Performed at [facility], [address]  [place], [place] [address]     Procedures and diagnostic studies:  DG Chest 2 View  Result Date: 11/25/2019 CLINICAL DATA:  Left-sided chest pain EXAM: CHEST - 2 VIEW COMPARISON:  11/18/2019 FINDINGS: Post CABG. The heart size and mediastinal contours are within normal limits. No focal airspace consolidation, pleural effusion, or pneumothorax. Tendon anchors within the right humeral head. IMPRESSION: No active cardiopulmonary disease. Electronically Signed   By: Davina Poke D.O.   On: 11/25/2019 11:54               LOS: 0 days   Zowie Lundahl  [facility]     11/26/2019, 2:16 PM

## 2019-11-26 NOTE — Progress Notes (Signed)
ANTICOAGULATION CONSULT NOTE -  Pharmacy Consult for heparin  Indication: chest pain/ACS  No Known Allergies  Patient Measurements: Height: 5\' 8"  (172.7 cm) Weight: 108.9 kg (240 lb) IBW/kg (Calculated) : 68.4 Heparin Dosing Weight: 92.3 kg  Vital Signs: Temp: 98.1 F (36.7 C) (06/26 1128) Temp Source: Oral (06/26 1128) BP: 103/64 (06/26 1128) Pulse Rate: 58 (06/26 1128)  Labs: Recent Labs    11/25/19 1118 11/25/19 1118 11/25/19 1350 11/25/19 1716 11/25/19 1846 11/26/19 0346 11/26/19 1334  HGB 11.4*  --   --   --   --  11.0*  --   HCT 32.8*  --   --   --   --  30.8*  --   PLT 290  --   --   --   --  277  --   APTT  --   --   --   --  29  --   --   LABPROT  --   --   --   --  12.6  --   --   INR  --   --   --   --  1.0  --   --   HEPARINUNFRC  --   --   --   --   --  1.06* 0.72*  CREATININE 2.03*  --   --   --   --  2.22*  --   TROPONINIHS 12   < > 63* 455* 793*  --   --    < > = values in this interval not displayed.    Estimated Creatinine Clearance: 35.5 mL/min (A) (by C-G formula based on SCr of 2.22 mg/dL (H)).   Medical History: Past Medical History:  Diagnosis Date  . CAD (coronary artery disease)   . CKD (chronic kidney disease)   . Diabetes mellitus without complication ([place])   . GERD (gastroesophageal reflux disease)   . Hyperlipemia   . Hypertension    Assessment: 74 yo male here with chest pain and troponins trending up to start on heparin drip. No oral anticoagulants noted on PTA med list.   0626 0346 HL 1.06, SUPRAtherapeutic.  Will hold heparin x 1 hour then resume at 1000 units/hr, recheck HL 8 hrs after infusion resumed.   Goal of Therapy:  Heparin level 0.3-0.7 units/ml Monitor platelets by anticoagulation protocol: Yes   Plan:  0626 1334 HL 0.72, SUPRAtherapeutic.  Will decrease Heparin drip to 900 units/hr, recheck HL ~8 hrs after rate change. Hgb 11.0  Plt 277 CBC in am.  Pharmacy will continue to follow.   Aldine Chakraborty  A 11/26/2019,2:24 PM

## 2019-11-26 NOTE — Progress Notes (Signed)
ANTICOAGULATION CONSULT NOTE -  Pharmacy Consult for heparin  Indication: chest pain/ACS  No Known Allergies  Patient Measurements: Height: 5\' 8"  (172.7 cm) Weight: 108.9 kg (240 lb) IBW/kg (Calculated) : 68.4 Heparin Dosing Weight: 92.3 kg  Vital Signs: Temp: 98.3 F (36.8 C) (06/26 2001) Temp Source: Oral (06/26 2001) BP: 107/71 (06/26 2001) Pulse Rate: 60 (06/26 2001)  Labs: Recent Labs    11/25/19 1118 11/25/19 1118 11/25/19 1350 11/25/19 1716 11/25/19 1846 11/26/19 0346 11/26/19 1334 11/26/19 2246  HGB 11.4*  --   --   --   --  11.0*  --   --   HCT 32.8*  --   --   --   --  30.8*  --   --   PLT 290  --   --   --   --  277  --   --   APTT  --   --   --   --  29  --   --   --   LABPROT  --   --   --   --  12.6  --   --   --   INR  --   --   --   --  1.0  --   --   --   HEPARINUNFRC  --   --   --   --   --  1.06* 0.72* 0.65  CREATININE 2.03*  --   --   --   --  2.22*  --   --   TROPONINIHS 12   < > 63* 455* 793*  --   --   --    < > = values in this interval not displayed.    Estimated Creatinine Clearance: 35.5 mL/min (A) (by C-G formula based on SCr of 2.22 mg/dL (H)).   Medical History: Past Medical History:  Diagnosis Date  . CAD (coronary artery disease)   . CKD (chronic kidney disease)   . Diabetes mellitus without complication ([place])   . GERD (gastroesophageal reflux disease)   . Hyperlipemia   . Hypertension    Assessment: 74 yo male here with chest pain and troponins trending up to start on heparin drip. No oral anticoagulants noted on PTA med list.   0626 0346 HL 1.06, SUPRAtherapeutic.  Will hold heparin x 1 hour then resume at 1000 units/hr, recheck HL 8 hrs after infusion resumed.   Goal of Therapy:  Heparin level 0.3-0.7 units/ml Monitor platelets by anticoagulation protocol: Yes   Plan:  0626 2246 HL 0.65, therapeutic x 1.  Will continue Heparin drip at 900 units/hr, recheck HL in 8 hrs to confirm.  CBC in am.  Pharmacy will  continue to follow.   Hart Robinsons A 11/26/2019,11:42 PM

## 2019-11-27 LAB — BASIC METABOLIC PANEL
Anion gap: 8 (ref 5–15)
BUN: 31 mg/dL — ABNORMAL HIGH (ref 8–23)
CO2: 23 mmol/L (ref 22–32)
Calcium: 9.5 mg/dL (ref 8.9–10.3)
Chloride: 106 mmol/L (ref 98–111)
Creatinine, Ser: 2.04 mg/dL — ABNORMAL HIGH (ref 0.61–1.24)
GFR calc Af Amer: 36 mL/min — ABNORMAL LOW (ref >60)
GFR calc non Af Amer: 31 mL/min — ABNORMAL LOW (ref >60)
Glucose, Bld: 157 mg/dL — ABNORMAL HIGH (ref 70–99)
Potassium: 4.9 mmol/L (ref 3.5–5.1)
Sodium: 137 mmol/L (ref 135–145)

## 2019-11-27 LAB — CBC
HCT: 32.5 % — ABNORMAL LOW (ref 39.0–52.0)
Hemoglobin: 10.9 g/dL — ABNORMAL LOW (ref 13.0–17.0)
MCH: 29.1 pg (ref 26.0–34.0)
MCHC: 33.5 g/dL (ref 30.0–36.0)
MCV: 86.7 fL (ref 80.0–100.0)
Platelets: 283 10*3/uL (ref 150–400)
RBC: 3.75 MIL/uL — ABNORMAL LOW (ref 4.22–5.81)
RDW: 14.1 % (ref 11.5–15.5)
WBC: 8.7 10*3/uL (ref 4.0–10.5)
nRBC: 0 % (ref 0.0–0.2)

## 2019-11-27 LAB — GLUCOSE, CAPILLARY
Glucose-Capillary: 159 mg/dL — ABNORMAL HIGH (ref 70–99)
Glucose-Capillary: 171 mg/dL — ABNORMAL HIGH (ref 70–99)

## 2019-11-27 LAB — HEPARIN LEVEL (UNFRACTIONATED)
Heparin Unfractionated: 0.72 IU/mL — ABNORMAL HIGH (ref 0.30–0.70)
Heparin Unfractionated: 0.35 IU/mL (ref 0.30–0.70)

## 2019-11-27 MED ORDER — SODIUM CHLORIDE 0.9% FLUSH: 3.0000 mL | INTRAVENOUS | Status: DC | PRN

## 2019-11-27 MED ORDER — SODIUM CHLORIDE 0.9 % WEIGHT BASED INFUSION
3.0000 mL/kg/h | INTRAVENOUS | Status: DC
  Administered 2019-11-28: 3 mL/kg/h via INTRAVENOUS

## 2019-11-27 MED ORDER — SODIUM CHLORIDE 0.9 % IV SOLN: 250.0000 mL | INTRAVENOUS | Status: DC | PRN

## 2019-11-27 MED ORDER — MAGNESIUM HYDROXIDE 400 MG/5ML PO SUSP: 5.0000 mL | Freq: Every day | ORAL | Status: DC | PRN

## 2019-11-27 MED ORDER — SODIUM CHLORIDE 0.9 % WEIGHT BASED INFUSION
1.0000 mL/kg/h | INTRAVENOUS | Status: DC
  Administered 2019-11-28 (×2): 1 mL/kg/h via INTRAVENOUS

## 2019-11-27 MED ORDER — ASPIRIN 81 MG PO CHEW
81.0000 mg | CHEWABLE_TABLET | ORAL | Status: AC
  Administered 2019-11-28: 81 mg via ORAL
  Filled 2019-11-27: qty 1

## 2019-11-27 NOTE — Progress Notes (Addendum)
Progress Note    BRODIN GELPI  UGQ:[id number] DOB: [birth year]  DOA: 11/25/2019 PCP: [facility] [place]      Brief Narrative:    Medical records reviewed and are as summarized below:  Justin Keith is a 74 y.o. male with medical history significant for hypertension, type 2 diabetes mellitus, CAD with history of CABG, CAD, hyperlipidemia, CKD stage IIIb, cardiomyopathy, presented to the hospital because of left-sided chest.  That developed while he was doing some yard work.  His troponins were elevated on admission.  He was admitted to the hospital for acute NSTEMI.  He was treated with IV heparin infusion, aspirin, metoprolol and Lipitor.  He was seen in consultation by the cardiologist.    Assessment/Plan:   Principal Problem:   NSTEMI (non-ST elevated myocardial infarction) ([place]) Active Problems:   HTN (hypertension)   GERD (gastroesophageal reflux disease)   CAD (coronary artery disease)   Uncontrolled secondary diabetes mellitus with stage 3 CKD (GFR 30-59) (HCC)   AKI (acute kidney injury) ([place])   Acute non-ST elevation myocardial infarction (NSTEMI) ([place])   Acute NSTEMI in a patient with CAD and h/o CABG: Continue IV heparin infusion monitor heparin level per protocol.  Continue metoprolol, aspirin and Lipitor.  Patient has been evaluated by the cardiologist with plan for left heart catheterization on 11/28/2019.   Type 2 diabetes mellitus with hyperglycemia: Hemoglobin A1c 12.8.  Continue Lantus and NovoLog as needed for hyperglycemia.  Metformin is on hold.  Consult diabetes coordinator and dietitian for further counseling.  I discussed management of diabetes with long-term insulin therapy patient will start he lives alone he does not think he can manage his diabetes with insulin.  Continue insulin medication to see if patient will be willing to use long-term insulin for adequate glucose control given underlying CAD.  Hypertension: Amlodipine  on hold.  Continue metoprolol  Hyperlipidemia: Continue Lipitor. LDL is 89, HDL 40 total cholesterol 166 and triglycerides 183.  CKD stage IIIb, probable AKI: Creatinine was 1.33 in February 2021.  Creatinine was 1.6 on 11/18/2019.  Creatinine is trending down.  Continue IV fluids and monitor BMP.   Body mass index is 36.55 kg/m.  (Obesity)  Diet Order            Diet Carb Modified Fluid consistency: Thin; Room service appropriate? Yes  Diet effective now                       Medications:   . aspirin  81 mg Oral Daily  . atorvastatin  80 mg Oral Daily  . insulin aspart  0-20 Units Subcutaneous TID WC  . insulin aspart  0-5 Units Subcutaneous QHS  . insulin glargine  20 Units Subcutaneous QHS  . metoprolol tartrate  25 mg Oral BID  . nitroGLYCERIN  0.5 inch Topical Q6H  . pantoprazole  40 mg Oral Daily  . sodium chloride flush  3 mL Intravenous Q12H   Continuous Infusions: . heparin 800 Units/hr (11/27/19 0823)  . lactated ringers 75 mL/hr at 11/27/19 0548     Anti-infectives (From admission, onward)   None             Family Communication/Anticipated D/C date and plan/Code Status   DVT prophylaxis:      Code Status: Full Code  Family Communication: Plan discussed with patient Disposition Plan:    Status is: Inpatient  Remains inpatient appropriate because:IV treatments appropriate due to intensity of  illness or inability to take PO and Inpatient level of care appropriate due to severity of illness   Dispo: The patient is from: Home              Anticipated d/c is to: Home              Anticipated d/c date is: 2 days              Patient currently is not medically stable to d/c.                Subjective:   C/o intermittent chest pain overnight but currently he has no chest pain  Objective:    Vitals:   11/26/19 1647 11/26/19 2001 11/27/19 0433 11/27/19 0812  BP: 123/67 107/71 125/64 122/66  Pulse: (!) 55 60 (!) 56 61   Resp: 18 18 20 17   Temp: 98.3 F (36.8 C) 98.3 F (36.8 C) 98.6 F (37 C) 98.2 F (36.8 C)  TempSrc:  Oral Oral Oral  SpO2: 97% 100% 98% 95%  Weight:   109 kg   Height:       No data found.   Intake/Output Summary (Last 24 hours) at 11/27/2019 1113 Last data filed at 11/27/2019 0440 Gross per 24 hour  Intake 116.1 ml  Output 1625 ml  Net -1508.9 ml   Filed Weights   11/25/19 1800 11/26/19 0531 11/27/19 0433  Weight: 108.1 kg 108.9 kg 109 kg    Exam:  GEN: NAD SKIN: No rash EYES: No pallor or icterus ENT: MMM CV: RRR PULM: No wheezing or rales heard ABD: soft, obese, NT, +BS CNS: AAO x 3, non focal EXT: No edema or tenderness     Data Reviewed:   I have personally reviewed following labs and imaging studies:  Labs: Labs show the following:   Basic Metabolic Panel: Recent Labs  Lab 11/25/19 1118 11/25/19 1118 11/26/19 0346 11/27/19 0515  NA 130*  --  134* 137  K 5.3*   < > 4.8 4.9  CL 99  --  104 106  CO2 22  --  22 23  GLUCOSE 365*  --  190* 157*  BUN 27*  --  30* 31*  CREATININE 2.03*  --  2.22* 2.04*  CALCIUM 9.8  --  9.3 9.5   < > = values in this interval not displayed.   GFR Estimated Creatinine Clearance: 38.6 mL/min (A) (by C-G formula based on SCr of 2.04 mg/dL (H)). Liver Function Tests: No results for input(s): AST, ALT, ALKPHOS, BILITOT, PROT, ALBUMIN in the last 168 hours. No results for input(s): LIPASE, AMYLASE in the last 168 hours. No results for input(s): AMMONIA in the last 168 hours. Coagulation profile Recent Labs  Lab 11/25/19 1846  INR 1.0    CBC: Recent Labs  Lab 11/25/19 1118 11/26/19 0346 11/27/19 0515  WBC 10.1 10.1 8.7  HGB 11.4* 11.0* 10.9*  HCT 32.8* 30.8* 32.5*  MCV 84.5 83.5 86.7  PLT 290 277 283   Cardiac Enzymes: No results for input(s): CKTOTAL, CKMB, CKMBINDEX, TROPONINI in the last 168 hours. BNP (last 3 results) No results for input(s): PROBNP in the last 8760 hours. CBG: Recent Labs   Lab 11/26/19 0804 11/26/19 1129 11/26/19 1645 11/26/19 2041 11/27/19 0800  GLUCAP 188* 227* 217* 212* 159*   D-Dimer: No results for input(s): DDIMER in the last 72 hours. Hgb A1c: Recent Labs    11/25/19 1716  HGBA1C 12.8*   Lipid Profile: Recent Labs  11/26/19 0346  CHOL 166  HDL 40*  LDLCALC 89  TRIG 183*  CHOLHDL 4.2   Thyroid function studies: No results for input(s): TSH, T4TOTAL, T3FREE, THYROIDAB in the last 72 hours.  Invalid input(s): FREET3 Anemia work up: No results for input(s): VITAMINB12, FOLATE, FERRITIN, TIBC, IRON, RETICCTPCT in the last 72 hours. Sepsis Labs: Recent Labs  Lab 11/25/19 1118 11/26/19 0346 11/27/19 0515  WBC 10.1 10.1 8.7    Microbiology Recent Results (from the past 240 hour(s))  SARS Coronavirus 2 by RT PCR (hospital order, performed in [facility] hospital lab) Nasopharyngeal Nasopharyngeal Swab     Status: None   Collection Time: 11/25/19  3:00 PM   Specimen: Nasopharyngeal Swab  Result Value Ref Range Status   SARS Coronavirus 2 NEGATIVE NEGATIVE Final    Comment: (NOTE) SARS-CoV-2 target nucleic acids are NOT DETECTED.  The SARS-CoV-2 RNA is generally detectable in upper and lower respiratory specimens during the acute phase of infection. The lowest concentration of SARS-CoV-2 viral copies this assay can detect is 250 copies / mL. A negative result does not preclude SARS-CoV-2 infection and should not be used as the sole basis for treatment or other patient management decisions.  A negative result may occur with improper specimen collection / handling, submission of specimen other than nasopharyngeal swab, presence of viral mutation(s) within the areas targeted by this assay, and inadequate number of viral copies (<250 copies / mL). A negative result must be combined with clinical observations, patient history, and epidemiological information.  Fact Sheet for Patients:    StrictlyIdeas.no  Fact Sheet for Healthcare Providers: BankingDealers.co.za  This test is not yet approved or  cleared by the [place] FDA and has been authorized for detection and/or diagnosis of SARS-CoV-2 by FDA under an Emergency Use Authorization (EUA).  This EUA will remain in effect (meaning this test can be used) for the duration of the COVID-19 declaration under Section 564(b)(1) of the Act, 21 U.S.C. section 360bbb-3(b)(1), unless the authorization is terminated or revoked sooner.  Performed at [facility], [address] [place], [place] [address]     Procedures and diagnostic studies:  DG Chest 2 View  Result Date: 11/25/2019 CLINICAL DATA:  Left-sided chest pain EXAM: CHEST - 2 VIEW COMPARISON:  11/18/2019 FINDINGS: Post CABG. The heart size and mediastinal contours are within normal limits. No focal airspace consolidation, pleural effusion, or pneumothorax. Tendon anchors within the right humeral head. IMPRESSION: No active cardiopulmonary disease. Electronically Signed   By: Davina Poke D.O.   On: 11/25/2019 11:54               LOS: 1 day   Burech Mcfarland  [facility]     11/27/2019, 11:13 AM

## 2019-11-27 NOTE — Progress Notes (Signed)
Patient Name: Justin Keith Date of Encounter: 11/27/2019  Hospital Problem List     Principal Problem:   NSTEMI (non-ST elevated myocardial infarction) [facility]) Active Problems:   HTN (hypertension)   GERD (gastroesophageal reflux disease)   CAD (coronary artery disease)   Uncontrolled secondary diabetes mellitus with stage 3 CKD (GFR 30-59) (HCC)   AKI (acute kidney injury) ([place])   Acute non-ST elevation myocardial infarction (NSTEMI) ([place])    Patient Profile     74 y.o.malewith history ofdiffuse 3 vessel cad s/p cabg with a lima to the lad, svg to om1, svg to rca. Cath done in 2019 revealed widely patent lima to lad and patent svg to om and rca. He also has history of gerd, hyperlipidemia. Had chest pain while doing yard work. Minimal troponin levels thus far 12 and 63and 455 and 793. EKG showed nsr with no ischemia. Has been treated with amlodipine 10, asa, atorvastatin 80 daily, lisinopril 10 daily, and sl ntg.He states he has been compliant with his meds. He states the pain is slightly increased with deep palpation but also is worse with exertion. He describes a squeezing sensation  Subjective   No chest pain  Inpatient Medications    . aspirin  81 mg Oral Daily  . atorvastatin  80 mg Oral Daily  . insulin aspart  0-20 Units Subcutaneous TID WC  . insulin aspart  0-5 Units Subcutaneous QHS  . insulin glargine  20 Units Subcutaneous QHS  . metoprolol tartrate  25 mg Oral BID  . nitroGLYCERIN  0.5 inch Topical Q6H  . pantoprazole  40 mg Oral Daily  . sodium chloride flush  3 mL Intravenous Q12H    Vital Signs    Vitals:   11/26/19 1647 11/26/19 2001 11/27/19 0433 11/27/19 0812  BP: 123/67 107/71 125/64 122/66  Pulse: (!) 55 60 (!) 56 61  Resp: 18 18 20 17   Temp: 98.3 F (36.8 C) 98.3 F (36.8 C) 98.6 F (37 C) 98.2 F (36.8 C)  TempSrc:  Oral Oral Oral  SpO2: 97% 100% 98% 95%  Weight:   109 kg   Height:        Intake/Output Summary (Last 24 hours)  at 11/27/2019 1045 Last data filed at 11/27/2019 0440 Gross per 24 hour  Intake 116.1 ml  Output 1625 ml  Net -1508.9 ml   Filed Weights   11/25/19 1800 11/26/19 0531 11/27/19 0433  Weight: 108.1 kg 108.9 kg 109 kg    Physical Exam    GEN: Well nourished, well developed, in no acute distress.  HEENT: normal.  Neck: Supple, no JVD, carotid bruits, or masses. Cardiac: RRR, no murmurs, rubs, or gallops. No clubbing, cyanosis, edema.  Radials/DP/PT 2+ and equal bilaterally.  Respiratory:  Respirations regular and unlabored, clear to auscultation bilaterally. GI: Soft, nontender, nondistended, BS + x 4. MS: no deformity or atrophy. Skin: warm and dry, no rash. Neuro:  Strength and sensation are intact. Psych: Normal affect.  Labs    CBC Recent Labs    11/26/19 0346 11/27/19 0515  WBC 10.1 8.7  HGB 11.0* 10.9*  HCT 30.8* 32.5*  MCV 83.5 86.7  PLT 277 657   Basic Metabolic Panel Recent Labs    11/26/19 0346 11/27/19 0515  NA 134* 137  K 4.8 4.9  CL 104 106  CO2 22 23  GLUCOSE 190* 157*  BUN 30* 31*  CREATININE 2.22* 2.04*  CALCIUM 9.3 9.5   Liver Function Tests No results for  input(s): AST, ALT, ALKPHOS, BILITOT, PROT, ALBUMIN in the last 72 hours. No results for input(s): LIPASE, AMYLASE in the last 72 hours. Cardiac Enzymes No results for input(s): CKTOTAL, CKMB, CKMBINDEX, TROPONINI in the last 72 hours. BNP No results for input(s): BNP in the last 72 hours. D-Dimer No results for input(s): DDIMER in the last 72 hours. Hemoglobin A1C Recent Labs    11/25/19 1716  HGBA1C 12.8*   Fasting Lipid Panel Recent Labs    11/26/19 0346  CHOL 166  HDL 40*  LDLCALC 89  TRIG 183*  CHOLHDL 4.2   Thyroid Function Tests No results for input(s): TSH, T4TOTAL, T3FREE, THYROIDAB in the last 72 hours.  Invalid input(s): FREET3  Telemetry    nsr  ECG    nsr with no ischemia  Radiology    DG Chest 2 View  Result Date: 11/25/2019 CLINICAL DATA:   Left-sided chest pain EXAM: CHEST - 2 VIEW COMPARISON:  11/18/2019 FINDINGS: Post CABG. The heart size and mediastinal contours are within normal limits. No focal airspace consolidation, pleural effusion, or pneumothorax. Tendon anchors within the right humeral head. IMPRESSION: No active cardiopulmonary disease. Electronically Signed   By: Davina Poke D.O.   On: 11/25/2019 11:54   DG Chest 2 View  Result Date: 11/18/2019 CLINICAL DATA:  Chest pain. EXAM: CHEST - 2 VIEW COMPARISON:  July 24, 2019 FINDINGS: Sternotomy wires are intact. The heart, hila, mediastinum, lungs, and pleura are normal. No cause for chest pain identified. IMPRESSION: No active cardiopulmonary disease. Electronically Signed   By: Dorise Bullion III M.D   On: 11/18/2019 09:46    Assessment & Plan    74 yo male with history of cad s/p cabg with a lima to lad, svg to om and rca who had cath several years ago showing widely patent bypass grafts. Now with chest pain with both typical and atypical pain. Troponin has increased to greater than 700.  No obvious EKG changes.  Appears to have ruled in for non-ST elevation myocardial infarction.  1.  Coronary artery disease-status post carotid bypass grafting with a LIMA to the LAD, saphenous vein graft to OM and RCA with patent grafts by cath several years ago now admitted with chest pain and was ruled in for non-ST elevation myocardial infarction.  Currently hemodynamically stable and pain-free.  Will need to proceed with left heart cath and continue with heparin over the weekend.  Risk benefits of heart catheter explained to the patient he agrees to proceed.  Further recommendations after cardiac cath which is scheduled for tomorrow morning. .  We will continue with current regimen for now.   Signed, Javier Docker Dahlila Pfahler MD 11/27/2019, 10:45 AM  Pager: [phone]

## 2019-11-27 NOTE — Progress Notes (Signed)
ANTICOAGULATION CONSULT NOTE -  Pharmacy Consult for heparin  Indication: chest pain/ACS  No Known Allergies  Patient Measurements: Height: 5\' 8"  (172.7 cm) Weight: 109 kg (240 lb 6.4 oz) IBW/kg (Calculated) : 68.4 Heparin Dosing Weight: 92.3 kg  Vital Signs: Temp: 98.6 F (37 C) (06/27 0433) Temp Source: Oral (06/27 0433) BP: 125/64 (06/27 0433) Pulse Rate: 56 (06/27 0433)  Labs: Recent Labs    11/25/19 1118 11/25/19 1118 11/25/19 1350 11/25/19 1716 11/25/19 1846 11/26/19 0346 11/26/19 0346 11/26/19 1334 11/26/19 2246 11/27/19 0515  HGB 11.4*   < >  --   --   --  11.0*  --   --   --  10.9*  HCT 32.8*  --   --   --   --  30.8*  --   --   --  32.5*  PLT 290  --   --   --   --  277  --   --   --  283  APTT  --   --   --   --  29  --   --   --   --   --   LABPROT  --   --   --   --  12.6  --   --   --   --   --   INR  --   --   --   --  1.0  --   --   --   --   --   HEPARINUNFRC  --   --   --   --   --  1.06*   < > 0.72* 0.65 0.72*  CREATININE 2.03*  --   --   --   --  2.22*  --   --   --  2.04*  TROPONINIHS 12   < > 63* 455* 793*  --   --   --   --   --    < > = values in this interval not displayed.    Estimated Creatinine Clearance: 38.6 mL/min (A) (by C-G formula based on SCr of 2.04 mg/dL (H)).   Medical History: Past Medical History:  Diagnosis Date  . CAD (coronary artery disease)   . CKD (chronic kidney disease)   . Diabetes mellitus without complication ([place])   . GERD (gastroesophageal reflux disease)   . Hyperlipemia   . Hypertension    Assessment: 74 yo male here with chest pain and troponins trending up to start on heparin drip. No oral anticoagulants noted on PTA med list.   0626 0346 HL 1.06, SUPRAtherapeutic.  Will hold heparin x 1 hour then resume at 1000 units/hr, recheck HL 8 hrs after infusion resumed.  0626 2246 HL 0.65, therapeutic x 1.  Will continue Heparin drip at 900 units/hr,  Goal of Therapy:  Heparin level 0.3-0.7  units/ml Monitor platelets by anticoagulation protocol: Yes   Plan:  0627 0515 HL 0.72, supratherapeutic.  Will decrease Heparin drip to 800 units/hr, recheck HL in 8 hrs to confirm.  CBC in am.  Pharmacy will continue to follow.   Marvel Mcphillips A 11/27/2019,8:02 AM

## 2019-11-27 NOTE — Progress Notes (Signed)
Inpatient Diabetes Program Recommendations  AACE/ADA: New Consensus Statement on Inpatient Glycemic Control (2015)  Target Ranges:  Prepandial:   less than 140 mg/dL      Peak postprandial:   less than 180 mg/dL (1-2 hours)      Critically ill patients:  140 - 180 mg/dL   Lab Results  Component Value Date   GLUCAP 159 (H) 11/27/2019   HGBA1C 12.8 (H) 11/25/2019    Review of Glycemic Control Results for Justin Keith, Justin Keith (MRN [id number]) as of 11/27/2019 10:40  Ref. Range 11/26/2019 11:29 11/26/2019 16:45 11/26/2019 20:41 11/27/2019 08:00  Glucose-Capillary Latest Ref Range: 70 - 99 mg/dL 227 (H) 217 (H) 212 (H) 159 (H)   Diabetes history: DM 2 Outpatient Diabetes medications:  Metformin 1000 mg daily Current orders for Inpatient glycemic control:  Novolog resistant tid with meals and HS Lantus 20 units q HS  Inpatient Diabetes Program Recommendations:    Referral received.  Agree with current orders.  Will have Diabetes coordinator f/u in person with patient on 6/28.    Thanks  Adah Perl, RN, BC-ADM Inpatient Diabetes Coordinator Pager [phone] (8a-5p)

## 2019-11-27 NOTE — Plan of Care (Signed)

## 2019-11-27 NOTE — Progress Notes (Signed)
ANTICOAGULATION CONSULT NOTE -  Pharmacy Consult for heparin  Indication: chest pain/ACS  No Known Allergies  Patient Measurements: Height: 5\' 8"  (172.7 cm) Weight: 109 kg (240 lb 6.4 oz) IBW/kg (Calculated) : 68.4 Heparin Dosing Weight: 92.3 kg  Vital Signs: Temp: 98.7 F (37.1 C) (06/27 1631) Temp Source: Oral (06/27 1631) BP: 120/68 (06/27 1631) Pulse Rate: 57 (06/27 1631)  Labs: Recent Labs    11/25/19 1118 11/25/19 1118 11/25/19 1350 11/25/19 1716 11/25/19 1846 11/26/19 0346 11/26/19 1334 11/26/19 2246 11/27/19 0515 11/27/19 1625  HGB 11.4*   < >  --   --   --  11.0*  --   --  10.9*  --   HCT 32.8*  --   --   --   --  30.8*  --   --  32.5*  --   PLT 290  --   --   --   --  277  --   --  283  --   APTT  --   --   --   --  29  --   --   --   --   --   LABPROT  --   --   --   --  12.6  --   --   --   --   --   INR  --   --   --   --  1.0  --   --   --   --   --   HEPARINUNFRC  --   --   --   --   --  1.06*   < > 0.65 0.72* 0.35  CREATININE 2.03*  --   --   --   --  2.22*  --   --  2.04*  --   TROPONINIHS 12   < > 63* 455* 793*  --   --   --   --   --    < > = values in this interval not displayed.    Estimated Creatinine Clearance: 38.6 mL/min (A) (by C-G formula based on SCr of 2.04 mg/dL (H)).   Medical History: Past Medical History:  Diagnosis Date  . CAD (coronary artery disease)   . CKD (chronic kidney disease)   . Diabetes mellitus without complication ([place])   . GERD (gastroesophageal reflux disease)   . Hyperlipemia   . Hypertension    Assessment: 74 yo male here with chest pain and troponins trending up to start on heparin drip. No oral anticoagulants noted on PTA med list.   0626 0346 HL 1.06, SUPRAtherapeutic.  Will hold heparin x 1 hour then resume at 1000 units/hr, recheck HL 8 hrs after infusion resumed.  0626 2246 HL 0.65, therapeutic x 1.  Will continue Heparin drip at 900 units/hr, 0627 0515 HL 0.72, decreased to 800 units/hr 0627 1625  HL 0.35, therapeutic x 1  Goal of Therapy:  Heparin level 0.3-0.7 units/ml Monitor platelets by anticoagulation protocol: Yes   Plan:  0627 1625 HL 0.35, therapeutic x 1 after rate change.  Will continue Heparin 800 units/hr, recheck HL in 8 hrs to confirm.  CBC in am.  Pharmacy will continue to follow.   Paulina Fusi, PharmD, BCPS 11/27/2019 5:56 PM

## 2019-11-28 ENCOUNTER — Inpatient Hospital Stay
Admit: 2019-11-28 | Discharge: 2019-11-28 | Disposition: A | Discharge status: Home / Self Care | Payer: Medicare Other | Attending: Internal Medicine | Admitting: Internal Medicine

## 2019-11-28 ENCOUNTER — Encounter: Payer: Self-pay | Admitting: Internal Medicine

## 2019-11-28 ENCOUNTER — Encounter
Admission: EM | Disposition: A | Discharge status: Home / Self Care | Payer: Self-pay | Source: Home / Self Care | Attending: Internal Medicine

## 2019-11-28 HISTORY — PX: LEFT HEART CATH AND CORS/GRAFTS ANGIOGRAPHY: CATH118250

## 2019-11-28 LAB — GLUCOSE, CAPILLARY
Glucose-Capillary: 139 mg/dL — ABNORMAL HIGH (ref 70–99)
Glucose-Capillary: 155 mg/dL — ABNORMAL HIGH (ref 70–99)
Glucose-Capillary: 152 mg/dL — ABNORMAL HIGH (ref 70–99)
Glucose-Capillary: 169 mg/dL — ABNORMAL HIGH (ref 70–99)
Glucose-Capillary: 208 mg/dL — ABNORMAL HIGH (ref 70–99)

## 2019-11-28 LAB — CBC
HCT: 30.1 % — ABNORMAL LOW (ref 39.0–52.0)
Hemoglobin: 10.2 g/dL — ABNORMAL LOW (ref 13.0–17.0)
MCH: 29.3 pg (ref 26.0–34.0)
MCHC: 33.9 g/dL (ref 30.0–36.0)
MCV: 86.5 fL (ref 80.0–100.0)
Platelets: 276 10*3/uL (ref 150–400)
RBC: 3.48 MIL/uL — ABNORMAL LOW (ref 4.22–5.81)
RDW: 14.1 % (ref 11.5–15.5)
WBC: 9 10*3/uL (ref 4.0–10.5)
nRBC: 0 % (ref 0.0–0.2)

## 2019-11-28 LAB — BASIC METABOLIC PANEL
Anion gap: 10 (ref 5–15)
Anion gap: 8 (ref 5–15)
BUN: 26 mg/dL — ABNORMAL HIGH (ref 8–23)
BUN: 29 mg/dL — ABNORMAL HIGH (ref 8–23)
CO2: 20 mmol/L — ABNORMAL LOW (ref 22–32)
CO2: 23 mmol/L (ref 22–32)
Calcium: 9.1 mg/dL (ref 8.9–10.3)
Calcium: 9.3 mg/dL (ref 8.9–10.3)
Chloride: 105 mmol/L (ref 98–111)
Chloride: 106 mmol/L (ref 98–111)
Creatinine, Ser: 1.77 mg/dL — ABNORMAL HIGH (ref 0.61–1.24)
Creatinine, Ser: 1.94 mg/dL — ABNORMAL HIGH (ref 0.61–1.24)
GFR calc Af Amer: 39 mL/min — ABNORMAL LOW (ref >60)
GFR calc Af Amer: 43 mL/min — ABNORMAL LOW (ref >60)
GFR calc non Af Amer: 33 mL/min — ABNORMAL LOW (ref >60)
GFR calc non Af Amer: 37 mL/min — ABNORMAL LOW (ref >60)
Glucose, Bld: 152 mg/dL — ABNORMAL HIGH (ref 70–99)
Glucose, Bld: 162 mg/dL — ABNORMAL HIGH (ref 70–99)
Potassium: 4.6 mmol/L (ref 3.5–5.1)
Potassium: 5 mmol/L (ref 3.5–5.1)
Sodium: 136 mmol/L (ref 135–145)
Sodium: 136 mmol/L (ref 135–145)

## 2019-11-28 LAB — ECHOCARDIOGRAM COMPLETE
Height: 68 in
Weight: 3883.62 oz

## 2019-11-28 LAB — HEPARIN LEVEL (UNFRACTIONATED): Heparin Unfractionated: 0.36 IU/mL (ref 0.30–0.70)

## 2019-11-28 SURGERY — LEFT HEART CATH AND CORS/GRAFTS ANGIOGRAPHY
Anesthesia: Moderate Sedation

## 2019-11-28 MED ORDER — FENTANYL CITRATE (PF) 100 MCG/2ML IJ SOLN
INTRAMUSCULAR | Status: AC
  Filled 2019-11-28: qty 2

## 2019-11-28 MED ORDER — MIDAZOLAM HCL 2 MG/2ML IJ SOLN
INTRAMUSCULAR | Status: DC | PRN
  Administered 2019-11-28: 1 mg via INTRAVENOUS

## 2019-11-28 MED ORDER — IOHEXOL 300 MG/ML  SOLN
INTRAMUSCULAR | Status: DC | PRN
  Administered 2019-11-28: 75 mL

## 2019-11-28 MED ORDER — SODIUM CHLORIDE 0.9% FLUSH: 3.0000 mL | INTRAVENOUS | Status: DC | PRN

## 2019-11-28 MED ORDER — LIDOCAINE HCL (PF) 1 % IJ SOLN
INTRAMUSCULAR | Status: AC
  Filled 2019-11-28: qty 30

## 2019-11-28 MED ORDER — HYDRALAZINE HCL 20 MG/ML IJ SOLN: 10.0000 mg | INTRAMUSCULAR | Status: AC | PRN

## 2019-11-28 MED ORDER — SODIUM CHLORIDE 0.9 % WEIGHT BASED INFUSION
1.0000 mL/kg/h | INTRAVENOUS | Status: DC
  Administered 2019-11-28: 1 mL/kg/h via INTRAVENOUS

## 2019-11-28 MED ORDER — LABETALOL HCL 5 MG/ML IV SOLN: 10.0000 mg | INTRAVENOUS | Status: AC | PRN

## 2019-11-28 MED ORDER — SODIUM CHLORIDE 0.9% FLUSH
3.0000 mL | Freq: Two times a day (BID) | INTRAVENOUS | Status: DC
  Administered 2019-11-28 – 2019-11-29 (×2): 3 mL via INTRAVENOUS

## 2019-11-28 MED ORDER — SODIUM CHLORIDE 0.9 % IV SOLN: INTRAVENOUS | Status: DC

## 2019-11-28 MED ORDER — ACETAMINOPHEN 325 MG PO TABS: 650.0000 mg | ORAL_TABLET | ORAL | Status: DC | PRN

## 2019-11-28 MED ORDER — FENTANYL CITRATE (PF) 100 MCG/2ML IJ SOLN
INTRAMUSCULAR | Status: DC | PRN
  Administered 2019-11-28: 25 ug via INTRAVENOUS

## 2019-11-28 MED ORDER — LIDOCAINE HCL (PF) 1 % IJ SOLN
INTRAMUSCULAR | Status: DC | PRN
  Administered 2019-11-28: 20 mL

## 2019-11-28 MED ORDER — HEPARIN (PORCINE) IN NACL 1000-0.9 UT/500ML-% IV SOLN
INTRAVENOUS | Status: AC
  Filled 2019-11-28: qty 1000

## 2019-11-28 MED ORDER — SODIUM CHLORIDE FLUSH 0.9 % IV SOLN
INTRAVENOUS | Status: AC
  Filled 2019-11-28: qty 10

## 2019-11-28 MED ORDER — ONDANSETRON HCL 4 MG/2ML IJ SOLN: 4.0000 mg | Freq: Four times a day (QID) | INTRAMUSCULAR | Status: DC | PRN

## 2019-11-28 MED ORDER — HEPARIN (PORCINE) IN NACL 1000-0.9 UT/500ML-% IV SOLN
INTRAVENOUS | Status: DC | PRN
  Administered 2019-11-28: 500 mL

## 2019-11-28 MED ORDER — MIDAZOLAM HCL 2 MG/2ML IJ SOLN
INTRAMUSCULAR | Status: AC
  Filled 2019-11-28: qty 2

## 2019-11-28 MED ORDER — SODIUM CHLORIDE 0.9 % IV SOLN: 250.0000 mL | INTRAVENOUS | Status: DC | PRN

## 2019-11-28 MED ORDER — CLOPIDOGREL BISULFATE 75 MG PO TABS
75.0000 mg | ORAL_TABLET | Freq: Every day | ORAL | Status: DC
  Administered 2019-11-28 – 2019-11-29 (×2): 75 mg via ORAL
  Filled 2019-11-28 (×2): qty 1

## 2019-11-28 SURGICAL SUPPLY — 18 items
CATH INFINITI 5 FR 3DRC (CATHETERS) ×3 IMPLANT
CATH INFINITI 5 FR IM (CATHETERS) ×3 IMPLANT
CATH INFINITI 5F MPA2 125 (CATHETERS) ×3 IMPLANT
CATH INFINITI 5FR ANG PIGTAIL (CATHETERS) ×3 IMPLANT
CATH INFINITI 5FR JL4 (CATHETERS) ×3 IMPLANT
CATH INFINITI JR4 5F (CATHETERS) ×3 IMPLANT
DEVICE CLOSURE MYNXGRIP 5F (Vascular Products) ×3 IMPLANT
DEVICE INFLAT 30 PLUS (MISCELLANEOUS) IMPLANT
GLIDESHEATH SLEND SS 6F .021 (SHEATH) IMPLANT
GUIDEWIRE INQWIRE 1.5J.035X260 (WIRE) IMPLANT
INQWIRE 1.5J .035X260CM (WIRE)
KIT MANI 3VAL PERCEP (MISCELLANEOUS) ×3 IMPLANT
NEEDLE PERC 18GX7CM (NEEDLE) ×3 IMPLANT
PACK CARDIAC CATH (CUSTOM PROCEDURE TRAY) ×3 IMPLANT
SHEATH AVANTI 5FR X 11CM (SHEATH) ×3 IMPLANT
WIRE EMERALD 3MM-J .035X260CM (WIRE) ×3 IMPLANT
WIRE GUIDERIGHT .035X150 (WIRE) ×3 IMPLANT
WIRE RUNTHROUGH .014X180CM (WIRE) IMPLANT

## 2019-11-28 NOTE — Progress Notes (Signed)
ANTICOAGULATION CONSULT NOTE -  Pharmacy Consult for heparin  Indication: chest pain/ACS  No Known Allergies  Patient Measurements: Height: 5\' 8"  (172.7 cm) Weight: 109 kg (240 lb 6.4 oz) IBW/kg (Calculated) : 68.4 Heparin Dosing Weight: 92.3 kg  Vital Signs: Temp: 99.1 F (37.3 C) (06/27 2011) Temp Source: Oral (06/27 2011) BP: 129/66 (06/27 2011) Pulse Rate: 60 (06/27 2011)  Labs: Recent Labs    11/25/19 1118 11/25/19 1350 11/25/19 1716 11/25/19 1846 11/26/19 0346 11/26/19 1334 11/27/19 0515 11/27/19 1625 11/28/19 0026  HGB   < >  --   --   --  11.0*  --  10.9*  --  10.2*  HCT   < >  --   --   --  30.8*  --  32.5*  --  30.1*  PLT   < >  --   --   --  277  --  283  --  276  APTT  --   --   --  29  --   --   --   --   --   LABPROT  --   --   --  12.6  --   --   --   --   --   INR  --   --   --  1.0  --   --   --   --   --   HEPARINUNFRC  --   --   --   --  1.06*   < > 0.72* 0.35 0.36  CREATININE   < >  --   --   --  2.22*  --  2.04*  --  1.94*  TROPONINIHS  --  63* 455* 793*  --   --   --   --   --    < > = values in this interval not displayed.    Estimated Creatinine Clearance: 40.6 mL/min (A) (by C-G formula based on SCr of 1.94 mg/dL (H)).   Medical History: Past Medical History:  Diagnosis Date  . CAD (coronary artery disease)   . CKD (chronic kidney disease)   . Diabetes mellitus without complication ([place])   . GERD (gastroesophageal reflux disease)   . Hyperlipemia   . Hypertension    Assessment: 74 yo male here with chest pain and troponins trending up to start on heparin drip. No oral anticoagulants noted on PTA med list.   0626 0346 HL 1.06, SUPRAtherapeutic.  Will hold heparin x 1 hour then resume at 1000 units/hr, recheck HL 8 hrs after infusion resumed.  0626 2246 HL 0.65, therapeutic x 1.  Will continue Heparin drip at 900 units/hr, 0627 0515 HL 0.72, decreased to 800 units/hr 0627 1625 HL 0.35, therapeutic x 1  Goal of Therapy:  Heparin  level 0.3-0.7 units/ml Monitor platelets by anticoagulation protocol: Yes   Plan:  0628 0026 HL 0.36, therapeutic x 2.  CBC stable.  Will continue Heparin 800 units/hr, recheck HL and CBC daily CBC in am.  Pharmacy will continue to follow.   Hart Robinsons, PharmD Clinical Pharmacist  11/28/2019 1:15 AM

## 2019-11-28 NOTE — Progress Notes (Signed)
Inpatient Diabetes Program Recommendations  AACE/ADA: New Consensus Statement on Inpatient Glycemic Control (2015)  Target Ranges:  Prepandial:   less than 140 mg/dL      Peak postprandial:   less than 180 mg/dL (1-2 hours)      Critically ill patients:  140 - 180 mg/dL   Results for GEROD, CALIGIURI (MRN [id number]) as of 11/28/2019 08:02  Ref. Range 11/27/2019 08:00 11/27/2019 11:30  Glucose-Capillary Latest Ref Range: 70 - 99 mg/dL 159 (H)  4 units NOVOLOG  171 (H)  4 units NOVOLOG    Results for SHOJI, PERTUIT (MRN [id number]) as of 11/28/2019 08:02  Ref. Range 11/28/2019 07:36  Glucose-Capillary Latest Ref Range: 70 - 99 mg/dL 152 (H)   Results for SEVYN, PAREDEZ (MRN [id number]) as of 11/28/2019 08:02  Ref. Range 03/15/2019 00:49 11/25/2019 17:16  Hemoglobin A1C Latest Ref Range: 4.8 - 5.6 % 7.7 (H) 12.8 (H)  (320 mg/dl)    Admit with: CP/ Unstable Angina  History: DM, CKD  Home DM Meds: Metformin 1000 mg Daily  Current Orders: Lantus 20 units QHS      Novolog Resistant Correction Scale/ SSI (0-20 units) TID AC + HS     NPO for Cardiac Cath this AM   MD- Please note Lantus was HELD last PM.  CBG was 152 this AM.  May consider reducing Lantus to 15 units QHS since CBG was only 152 this AM and NO Lantus given last PM  Will attempt to see pt today to discuss going home on insulin and see if he will be willing to take insulin at home     --Will follow patient during hospitalization--  Wyn Quaker RN, MSN, CDE Diabetes Coordinator Inpatient Glycemic Control Team Team Pager: [phone] (8a-5p)

## 2019-11-28 NOTE — Progress Notes (Addendum)
Progress Note    Justin Keith  AST:[id number] DOB: [birth year]  DOA: 11/25/2019 [facility]: [facility] [place]      Brief Narrative:    Medical records reviewed and are as summarized below:  Justin Keith is a 74 y.o. male with medical history significant for hypertension, type 2 diabetes mellitus, CAD with history of CABG, CAD, hyperlipidemia, CKD stage IIIb, cardiomyopathy, presented to the hospital because of left-sided chest.  That developed while he was doing some yard work.  His troponins were elevated on admission.  He was admitted to the hospital for acute NSTEMI.  He was treated with IV heparin infusion, aspirin, metoprolol and Lipitor.  He was seen in consultation by the cardiologist.    Assessment/Plan:   Principal Problem:   NSTEMI (non-ST elevated myocardial infarction) ([place]) Active Problems:   HTN (hypertension)   GERD (gastroesophageal reflux disease)   CAD (coronary artery disease)   Uncontrolled secondary diabetes mellitus with stage 3 CKD (GFR 30-59) (H[facility])   AKI (acute kidney injury) ([place])   Acute non-ST elevation myocardial infarction (NSTEMI) ([place])   Acute NSTEMI in a patient with CAD and h/o CABG: Continue IV heparin infusion monitor heparin level per protocol.  Continue metoprolol, aspirin and Lipitor. s/p left heart catheterization on 11/28/2019.  It showed patent LIMA to LAD, patent SVG to OM and RCA, LCx 50% mid to distal.  Medical management was recommended.  Cardiologist, Dr. Ubaldo Glassing, recommended dual antiplatelet therapy with Plavix, statin and beta-blockers.  Type 2 diabetes mellitus with hyperglycemia: Hemoglobin A1c 12.8.  Continue Lantus and NovoLog as needed for hyperglycemia.  Metformin is on hold.  Patient has been educated on insulin injections and he thinks he will be able to do this at home.  Hypertension: Amlodipine on hold.  Continue metoprolol  Hyperlipidemia: Continue Lipitor. LDL is 89, HDL 40 total cholesterol 166  and triglycerides 183.  AKI on CKD stage IIIb: Creatinine was 1.33 in February 2021.  Creatinine was 1.6 on 11/18/2019.  Creatinine is trending down.  Continue IV fluids and monitor BMP because of exposure to contrast dye today.   Body mass index is 36.91 kg/m.  (Obesity)  Diet Order            Diet Heart Room service appropriate? Yes; Fluid consistency: Thin  Diet effective now                       Medications:   . aspirin  81 mg Oral Daily  . atorvastatin  80 mg Oral Daily  . clopidogrel  75 mg Oral Daily  . insulin aspart  0-20 Units Subcutaneous TID WC  . insulin aspart  0-5 Units Subcutaneous QHS  . insulin glargine  20 Units Subcutaneous QHS  . metoprolol tartrate  25 mg Oral BID  . nitroGLYCERIN  0.5 inch Topical Q6H  . pantoprazole  40 mg Oral Daily  . sodium chloride flush  3 mL Intravenous Q12H  . sodium chloride flush  3 mL Intravenous Q12H  . sodium chloride flush       Continuous Infusions: . sodium chloride    . sodium chloride       Anti-infectives (From admission, onward)   None             Family Communication/Anticipated D/C date and plan/Code Status   DVT prophylaxis:      Code Status: Full Code  Family Communication: Plan discussed with his brother and sister  at the bedside. Disposition Plan:    Status is: Inpatient  Remains inpatient appropriate because:IV treatments appropriate due to intensity of illness or inability to take PO and Inpatient level of care appropriate due to severity of illness   Dispo: The patient is from: Home              Anticipated d/c is to: Home              Anticipated d/c date is: 1 day              Patient currently is not medically stable to d/c.                Subjective:   No chest pain or shortness of breath.  Patient had left heart cath this morning.  Objective:    Vitals:   11/28/19 0900 11/28/19 0915 11/28/19 0930 11/28/19 1141  BP: 116/62 122/72 140/82 104/64   Pulse: (!) 54 (!) 52 (!) 53 (!) 49  Resp: 17 16 13 18   Temp:    98 F (36.7 C)  TempSrc:      SpO2: 94% 96% 100% 98%  Weight:      Height:       No data found.   Intake/Output Summary (Last 24 hours) at 11/28/2019 1429 Last data filed at 11/28/2019 1345 Gross per 24 hour  Intake 2083.26 ml  Output 1950 ml  Net 133.26 ml   Filed Weights   11/27/19 0433 11/28/19 0450 11/28/19 0724  Weight: 109 kg 110.1 kg 110.1 kg    Exam:  GEN: No acute distress SKIN: No rash EYES: No pallor or icterus ENT: MMM CV: RRR PULM: Clear to auscultation bilaterally ABD: soft, obese, NT, +BS CNS: AAO x 3, non focal EXT: No edema or tenderness     Data Reviewed:   I have personally reviewed following labs and imaging studies:  Labs: Labs show the following:   Basic Metabolic Panel: Recent Labs  Lab 11/25/19 1118 11/25/19 1118 11/26/19 0346 11/26/19 0346 11/27/19 0515 11/27/19 0515 11/28/19 0026 11/28/19 1033  NA 130*  --  134*  --  137  --  136 136  K 5.3*   < > 4.8   < > 4.9   < > 4.6 5.0  CL 99  --  104  --  106  --  106 105  CO2 22  --  22  --  23  --  20* 23  GLUCOSE 365*  --  190*  --  157*  --  152* 162*  BUN 27*  --  30*  --  31*  --  29* 26*  CREATININE 2.03*  --  2.22*  --  2.04*  --  1.94* 1.77*  CALCIUM 9.8  --  9.3  --  9.5  --  9.1 9.3   < > = values in this interval not displayed.   GFR Estimated Creatinine Clearance: 44.7 mL/min (A) (by C-G formula based on SCr of 1.77 mg/dL (H)). Liver Function Tests: No results for input(s): AST, ALT, ALKPHOS, BILITOT, PROT, ALBUMIN in the last 168 hours. No results for input(s): LIPASE, AMYLASE in the last 168 hours. No results for input(s): AMMONIA in the last 168 hours. Coagulation profile Recent Labs  Lab 11/25/19 1846  INR 1.0    CBC: Recent Labs  Lab 11/25/19 1118 11/26/19 0346 11/27/19 0515 11/28/19 0026  WBC 10.1 10.1 8.7 9.0  HGB 11.4* 11.0* 10.9* 10.2*  HCT 32.8* 30.8*  32.5* 30.1*  MCV 84.5  83.5 86.7 86.5  PLT 290 277 283 276   Cardiac Enzymes: No results for input(s): CKTOTAL, CKMB, CKMBINDEX, TROPONINI in the last 168 hours. BNP (last 3 results) No results for input(s): PROBNP in the last 8760 hours. CBG: Recent Labs  Lab 11/27/19 0800 11/27/19 1130 11/27/19 1631 11/27/19 2115 11/28/19 0736  GLUCAP 159* 171* 139* 155* 152*   D-Dimer: No results for input(s): DDIMER in the last 72 hours. Hgb A1c: Recent Labs    11/25/19 1716  HGBA1C 12.8*   Lipid Profile: Recent Labs    11/26/19 0346  CHOL 166  HDL 40*  LDLCALC 89  TRIG 183*  CHOLHDL 4.2   Thyroid function studies: No results for input(s): TSH, T4TOTAL, T3FREE, THYROIDAB in the last 72 hours.  Invalid input(s): FREET3 Anemia work up: No results for input(s): VITAMINB12, FOLATE, FERRITIN, TIBC, IRON, RETICCTPCT in the last 72 hours. Sepsis Labs: Recent Labs  Lab 11/25/19 1118 11/26/19 0346 11/27/19 0515 11/28/19 0026  WBC 10.1 10.1 8.7 9.0    Microbiology Recent Results (from the past 240 hour(s))  SARS Coronavirus 2 by RT PCR (hospital order, performed in [facility] hospital lab) Nasopharyngeal Nasopharyngeal Swab     Status: None   Collection Time: 11/25/19  3:00 PM   Specimen: Nasopharyngeal Swab  Result Value Ref Range Status   SARS Coronavirus 2 NEGATIVE NEGATIVE Final    Comment: (NOTE) SARS-CoV-2 target nucleic acids are NOT DETECTED.  The SARS-CoV-2 RNA is generally detectable in upper and lower respiratory specimens during the acute phase of infection. The lowest concentration of SARS-CoV-2 viral copies this assay can detect is 250 copies / mL. A negative result does not preclude SARS-CoV-2 infection and should not be used as the sole basis for treatment or other patient management decisions.  A negative result may occur with improper specimen collection / handling, submission of specimen other than nasopharyngeal swab, presence of viral mutation(s) within the areas targeted  by this assay, and inadequate number of viral copies (<250 copies / mL). A negative result must be combined with clinical observations, patient history, and epidemiological information.  Fact Sheet for Patients:   StrictlyIdeas.no  Fact Sheet for Healthcare Providers: BankingDealers.co.za  This test is not yet approved or  cleared by the [place] FDA and has been authorized for detection and/or diagnosis of SARS-CoV-2 by FDA under an Emergency Use Authorization (EUA).  This EUA will remain in effect (meaning this test can be used) for the duration of the COVID-19 declaration under Section 564(b)(1) of the Act, 21 U.S.C. section 360bbb-3(b)(1), unless the authorization is terminated or revoked sooner.  Performed at [facility], [address] [place], [place] [address]     Procedures and diagnostic studies:  CARDIAC CATHETERIZATION  Result Date: 11/28/2019  Mid LM lesion is 25% stenosed.  Ost LAD lesion is 100% stenosed.  Ost Cx lesion is 50% stenosed.  Prox Cx lesion is 50% stenosed.  Dist Cx lesion is 50% stenosed.  Ost 1st Mrg lesion is 100% stenosed.  Ost 2nd Mrg lesion is 100% stenosed.  Ost RCA to Prox RCA lesion is 100% stenosed.  1st RPL lesion is 50% stenosed.  Patent lima to lad, patent svg to om and rca. Lcx-50% mid to distal. Medical managemnt to include dual antiplatelet therapy, atorvastatin at high intensity, beta blockers.   ECHOCARDIOGRAM COMPLETE  Result Date: 11/28/2019    ECHOCARDIOGRAM REPORT   Patient Name:   THOMES BURAK Date  of Exam: 11/28/2019 Medical Rec #:  [id number]        Height:       68.0 in Accession #:    [id number]       Weight:       242.7 lb Date of Birth:  [birth year]        BSA:          2.219 m Patient Age:    72 years         BP:           140/82 mmHg Patient Gender: M                HR:           53 bpm. Exam Location:  [facility] Procedure: 2D Echo, Cardiac Doppler and Color  Doppler Indications:     Acute coronary syndrome 124.9  History:         Patient has no prior history of Echocardiogram examinations.                  CAD; Risk Factors:Diabetes and Hypertension.  Sonographer:     Sherrie Sport RDCS (AE) Referring Phys:  [place] Diagnosing Phys: Isaias Cowman MD  Sonographer Comments: No apical window and no subcostal window. IMPRESSIONS  1. Left ventricular ejection fraction, by estimation, is 50 to 55%. The left ventricle has low normal function. The left ventricle has no regional wall motion abnormalities. Left ventricular diastolic parameters are indeterminate.  2. Right ventricular systolic function is normal. The right ventricular size is normal.  3. The mitral valve is normal in structure. Mild mitral valve regurgitation. No evidence of mitral stenosis.  4. The aortic valve is normal in structure. Aortic valve regurgitation is not visualized. No aortic stenosis is present.  5. The inferior vena cava is normal in size with greater than 50% respiratory variability, suggesting right atrial pressure of 3 mmHg. FINDINGS  Left Ventricle: Left ventricular ejection fraction, by estimation, is 50 to 55%. The left ventricle has low normal function. The left ventricle has no regional wall motion abnormalities. The left ventricular internal cavity size was normal in size. There is no left ventricular hypertrophy. Left ventricular diastolic parameters are indeterminate. Right Ventricle: The right ventricular size is normal. No increase in right ventricular wall thickness. Right ventricular systolic function is normal. Left Atrium: Left atrial size was normal in size. Right Atrium: Right atrial size was normal in size. Pericardium: There is no evidence of pericardial effusion. Mitral Valve: The mitral valve is normal in structure. Normal mobility of the mitral valve leaflets. Mild mitral valve regurgitation. No evidence of mitral valve stenosis. Tricuspid Valve: The  tricuspid valve is normal in structure. Tricuspid valve regurgitation is mild . No evidence of tricuspid stenosis. Aortic Valve: The aortic valve is normal in structure. Aortic valve regurgitation is not visualized. No aortic stenosis is present. Pulmonic Valve: The pulmonic valve was normal in structure. Pulmonic valve regurgitation is not visualized. No evidence of pulmonic stenosis. Aorta: The aortic root is normal in size and structure. Venous: The inferior vena cava is normal in size with greater than 50% respiratory variability, suggesting right atrial pressure of 3 mmHg. IAS/Shunts: No atrial level shunt detected by color flow Doppler.  LEFT VENTRICLE PLAX 2D LVIDd:         4.62 cm LVIDs:         3.39 cm LV PW:         1.03 cm LV IVS:  1.18 cm LVOT diam:     2.10 cm LVOT Area:     3.46 cm  LEFT ATRIUM         Index LA diam:    4.50 cm 2.03 cm/m                        PULMONIC VALVE AORTA                 PV Vmax:        0.73 m/s Ao Root diam: 3.00 cm PV Peak grad:   2.1 mmHg                       RVOT Peak grad: 4 mmHg   SHUNTS Systemic Diam: 2.10 cm Isaias Cowman MD Electronically signed by Isaias Cowman MD Signature Date/Time: 11/28/2019/1:01:19 PM    Final                LOS: 2 days   Baine Decesare  [facility]     11/28/2019, 2:29 PM

## 2019-11-28 NOTE — Plan of Care (Signed)
  RD consulted for nutrition education regarding diabetes.   Lab Results  Component Value Date   HGBA1C 12.8 (H) 11/25/2019   Met with patient at bedside. He reports his appetite is good now and at baseline. He ate 100% of his lunch today. He reports he was not able to eat two meals yesterday and breakfast this morning as he was being kept NPO for possible procedures. He did have the procedure this morning so he was able to eat lunch. Patient reports at home he usually eats 2 meals per day and then occasionally a snack. He reports he eats some type of meat/protein with sides. He does report he occasionally eats "junk food" but is unable to provide further details and reports it "depends." He reports he used to drink soda but he has started drinking more water lately.  RD provided "Carbohydrate Counting for People with Diabetes" handout from the Academy of Nutrition and Dietetics. Discussed different food groups and their effects on blood sugar, emphasizing carbohydrate-containing foods. Provided list of carbohydrates and recommended serving sizes of common foods.  Discussed importance of controlled and consistent carbohydrate intake throughout the day. Provided examples of ways to balance meals/snacks and encouraged intake of high-fiber, whole grain complex carbohydrates. Teach back method used.  Expect fair to good compliance.  Body mass index is 36.91 kg/m. Pt meets criteria for obesity class II based on current BMI.  Current diet order is heart healthy, patient is consuming approximately 100% of meals at this time. Labs and medications reviewed. No further nutrition interventions warranted at this time. RD contact information provided. If additional nutrition issues arise, please re-consult RD.  Jacklynn Barnacle, MS, RD, LDN Pager number available on Amion

## 2019-11-28 NOTE — Progress Notes (Signed)
Results for Justin Keith, Justin Keith (MRN [id number]) as of 11/28/2019 11:30  Ref. Range 11/28/2019 07:36  Glucose-Capillary Latest Ref Range: 70 - 99 mg/dL 152 (H)    MD- Please note Lantus was HELD last PM.  CBG was 152 this AM.  May consider reducing Lantus to 15 units QHS since CBG was only 152 this AM and NO Lantus given last PM   Order Numbers for Lantus and Insulin pen needles if decision made to send pt home on Lantus:  Lantus Insulin pen- Order # [id number]  Insulin Pen Needles- Order # [id number]  May also consider increasing home dose of Metformin to 1000 mg BID.   Met w/ pt at bedside this AM.  Pt A&O and able to answer all questions.  Sister and Brother at bedside as well.  Pt told me he has not been doing well with his diet at home.  Drinks a lot of sugary beverages and eats out a lot (including fast food).  Discussed DM diet information with patient.  Encouraged patient to avoid beverages with sugar (regular soda, sweet tea, lemonade, fruit juice) and to consume mostly water.  Discussed what foods contain carbohydrates and how carbohydrates affect the body's blood sugar levels.  Encouraged patient to be careful with his portion sizes (especially grains, starchy vegetables, and fruits).    Spoke with patient about his current A1c of 12.8% and how much higher this A1c is since October (A1c on 03/15/2019 was 7.7%).  Explained what an A1c is and what it measures.  Reminded patient that his goal A1c is 7% or less per ADA standards to prevent both acute and long-term complications.  Explained to patient the extreme importance of good glucose control at home.  Encouraged patient to check his CBGs at least bid at home (fasting and another check within the day) and to record all CBGs in a logbook for his PCP to review.  Discussed with pt the possibility of going home on insulin.  Pt stated he thinks he could manage one shot of insulin per day.  Was concerned about having to figure out how much insulin to  take, however, I explained to pt that the MD may be willing to prescribe one injection of insulin per day at a set dose and this may make taking insulin easier for him at home.  Pt agreeable to try.    Explained what Lantus insulin is and how to take.  Stressed the importance of taking the Lantus insulin at the same time every day.  Educated patient on insulin pen use at home (brother and sister available for education as well).   Reviewed all steps of insulin pen including attachment of needle, 2-unit air shot, dialing up dose, giving injection, rotation of injection sites, removing needle, disposal of sharps, storage of unused insulin, disposal of insulin etc.  Patient able to provide successful return demonstration.  Reviewed troubleshooting with insulin pen.  Also reviewed Signs/Symptoms of Hypoglycemia with patient and how to treat Hypoglycemia at home.    Have asked RNs caring for patient to please allow patient to give all injections here in hospital as much as possible for practice.  MD to give patient Rxs for insulin pens and insulin pen needles.       --Will follow patient during hospitalization--  Wyn Quaker RN, MSN, CDE Diabetes Coordinator Inpatient Glycemic Control Team Team Pager: [phone] (8a-5p)

## 2019-11-28 NOTE — Progress Notes (Signed)
*  PRELIMINARY RESULTS* Echocardiogram 2D Echocardiogram has been performed.  Justin Keith 11/28/2019, 10:20 AM

## 2019-11-28 NOTE — Progress Notes (Signed)
Patient was educated on how to self administer insulin. Patient was able to demonstrate and administer his insulin correctly.

## 2019-11-28 NOTE — Plan of Care (Signed)
  Problem: Education: Goal: Knowledge of General Education information will improve Description: Including pain rating scale, medication(s)/side effects and non-pharmacologic comfort measures Outcome: Progressing   Problem: Health Behavior/Discharge Planning: Goal: Ability to manage health-related needs will improve Outcome: Progressing   Problem: Clinical Measurements: Goal: Will remain free from infection Outcome: Progressing   

## 2019-11-29 LAB — BASIC METABOLIC PANEL
Anion gap: 8 (ref 5–15)
BUN: 22 mg/dL (ref 8–23)
CO2: 24 mmol/L (ref 22–32)
Calcium: 9.3 mg/dL (ref 8.9–10.3)
Chloride: 106 mmol/L (ref 98–111)
Creatinine, Ser: 1.75 mg/dL — ABNORMAL HIGH (ref 0.61–1.24)
GFR calc Af Amer: 44 mL/min — ABNORMAL LOW (ref >60)
GFR calc non Af Amer: 38 mL/min — ABNORMAL LOW (ref >60)
Glucose, Bld: 152 mg/dL — ABNORMAL HIGH (ref 70–99)
Potassium: 4.6 mmol/L (ref 3.5–5.1)
Sodium: 138 mmol/L (ref 135–145)

## 2019-11-29 LAB — CBC
HCT: 31.3 % — ABNORMAL LOW (ref 39.0–52.0)
Hemoglobin: 10.4 g/dL — ABNORMAL LOW (ref 13.0–17.0)
MCH: 29.3 pg (ref 26.0–34.0)
MCHC: 33.2 g/dL (ref 30.0–36.0)
MCV: 88.2 fL (ref 80.0–100.0)
Platelets: 259 10*3/uL (ref 150–400)
RBC: 3.55 MIL/uL — ABNORMAL LOW (ref 4.22–5.81)
RDW: 14.1 % (ref 11.5–15.5)
WBC: 8.4 10*3/uL (ref 4.0–10.5)
nRBC: 0 % (ref 0.0–0.2)

## 2019-11-29 LAB — GLUCOSE, CAPILLARY: Glucose-Capillary: 141 mg/dL — ABNORMAL HIGH (ref 70–99)

## 2019-11-29 MED ORDER — METOPROLOL TARTRATE 25 MG PO TABS: 25.0000 mg | ORAL_TABLET | Freq: Two times a day (BID) | ORAL | 0 refills | Status: DC

## 2019-11-29 MED ORDER — METOPROLOL TARTRATE 25 MG PO TABS: 25.0000 mg | ORAL_TABLET | Freq: Two times a day (BID) | ORAL | 0 refills | Status: AC

## 2019-11-29 MED ORDER — ENOXAPARIN SODIUM 40 MG/0.4ML ~~LOC~~ SOLN
40.0000 mg | SUBCUTANEOUS | Status: DC
  Administered 2019-11-29: 40 mg via SUBCUTANEOUS
  Filled 2019-11-29: qty 0.4

## 2019-11-29 MED ORDER — INSULIN PEN NEEDLE 32G X 4 MM MISC: 1.0000 "application " | Freq: Every day | 0 refills | Status: AC

## 2019-11-29 MED ORDER — CLOPIDOGREL BISULFATE 75 MG PO TABS: 75.0000 mg | ORAL_TABLET | Freq: Every day | ORAL | 0 refills | Status: DC

## 2019-11-29 MED ORDER — BLOOD GLUCOSE METER KIT: PACK | 0 refills | Status: AC

## 2019-11-29 MED ORDER — INSULIN GLARGINE 100 UNIT/ML SOLOSTAR PEN
15.0000 [IU] | PEN_INJECTOR | Freq: Every day | SUBCUTANEOUS | 0 refills | Status: DC
Start: 2019-11-29 — End: 2021-04-15

## 2019-11-29 NOTE — Progress Notes (Signed)
[name] to be D/C'd Home per MD order. Patient given discharge teaching and paperwork regarding medications, diet, follow-up appointments and activity. Patient understanding verbalized. No questions or complaints at this time. Skin condition as charted. IV and telemetry removed prior to leaving.  No further needs by Care Management/Social Work. Prescriptions given.  An After Visit Summary was printed and given to the patient.   Patient escorted via wheelchair by volunteer to ride home with sister.  Justin Keith

## 2019-11-29 NOTE — Progress Notes (Signed)
Patient declines bed alarm. Educated on safety. Discharging soon.

## 2019-11-29 NOTE — Discharge Summary (Signed)
Physician Discharge Summary  Justin Keith TML:[id number] DOB: August 28, 1945 DOA: 11/25/2019  PCP: [facility] date: 11/25/2019 Discharge date: 11/29/2019  Discharge disposition: Home   Recommendations for Outpatient Follow-Up:   Outpatient follow-up with PCP in 1 week Outpatient follow-up with cardiologist as scheduled   Discharge Diagnosis:   Principal Problem:   NSTEMI (non-ST elevated myocardial infarction) ([place]) Active Problems:   HTN (hypertension)   GERD (gastroesophageal reflux disease)   CAD (coronary artery disease)   Uncontrolled secondary diabetes mellitus with stage 3 CKD (GFR 30-59) ([facility])   AKI (acute kidney injury) ([place])   Acute non-ST elevation myocardial infarction (NSTEMI) ([place])    Discharge Condition: Stable.  Diet recommendation:  Diet Order            Diet - low sodium heart healthy           Diet Carb Modified                     Code Status: Full Code     Hospital Course:   Mr. Justin Keith is a 74 y.o. male with medical history significant for hypertension, type 2 diabetes mellitus, CAD with history of CABG, CAD, hyperlipidemia, CKD stage IIIb, cardiomyopathy, presented to the hospital because of left-sided chest.  That developed while he was doing some yard work.  His troponins were elevated on admission.  He was admitted to the hospital for acute NSTEMI.  He was treated with IV heparin infusion, aspirin, metoprolol and Lipitor.  He was seen in consultation by the cardiologist.  He underwent left heart cath which revealed patent LIMA to the LAD, patent SVG to OM1, and patent SVG to RCA.  Left circumflex had a 50% stenosed lesion in the mid to distal segment.  Medical management was recommended.   He also has type 2 diabetes mellitus with severe hyperglycemia. Hemoglobin A1c was 12.8. Because he has multiple cardiovascular risk factors, adequate glucose control with insulin therapy was recommended.  Patient was educated on management of diabetes mellitus including insulin injections. He was seen by diabetes coordinator and dietitian for further education/counseling. He feels comfortable injecting himself with insulin at home and monitoring his blood glucose levels. He will therefore be discharged L[place] with close outpatient follow-up.  He also had acute kidney injury on CKD stage IIIb. He was treated with IV fluids and creatinine improved although it did not return to baseline. His condition has improved and he is deemed stable for discharge to home.      Discharge Exam:   Vitals:   11/29/19 0522 11/29/19 0813  BP: (!) 111/58 131/68  Pulse: (!) 53 (!) 51  Resp: 20 19  Temp: 97.7 F (36.5 C) 98 F (36.7 C)  SpO2: 96% 97%   Vitals:   11/28/19 1635 11/28/19 1947 11/29/19 0522 11/29/19 0813  BP: (!) 134/55 (!) 111/57 (!) 111/58 131/68  Pulse: (!) 52 (!) 56 (!) 53 (!) 51  Resp: '18  20 19  '$ Temp: 98.4 F (36.9 C) 98.6 F (37 C) 97.7 F (36.5 C) 98 F (36.7 C)  TempSrc: Oral Oral  Oral  SpO2: 98% 98% 96% 97%  Weight:   109.3 kg   Height:         GEN: NAD SKIN: No rash EYES: EOMI ENT: MMM CV: RRR PULM: CTA B ABD: soft, obese, NT, +BS CNS: AAO x 3, non focal EXT: No edema or tenderness   The results of  significant diagnostics from this hospitalization (including imaging, microbiology, ancillary and laboratory) are listed below for reference.     Procedures and Diagnostic Studies:   CARDIAC CATHETERIZATION  Result Date: 11/28/2019  Mid LM lesion is 25% stenosed.  Ost LAD lesion is 100% stenosed.  Ost Cx lesion is 50% stenosed.  Prox Cx lesion is 50% stenosed.  Dist Cx lesion is 50% stenosed.  Ost 1st Mrg lesion is 100% stenosed.  Ost 2nd Mrg lesion is 100% stenosed.  Ost RCA to Prox RCA lesion is 100% stenosed.  1st RPL lesion is 50% stenosed.  Patent lima to lad, patent svg to om and rca. Lcx-50% mid to distal. Medical managemnt to include dual antiplatelet  therapy, atorvastatin at high intensity, beta blockers.   ECHOCARDIOGRAM COMPLETE  Result Date: 11/28/2019    ECHOCARDIOGRAM REPORT   Patient Name:   Justin Keith Date of Exam: 11/28/2019 Medical Rec #:  [id number]        Height:       68.0 in Accession #:    [id number]       Weight:       242.7 lb Date of Birth:  Mar 25, 1946        BSA:          2.219 m Patient Age:    22 years         BP:           140/82 mmHg Patient Gender: M                HR:           53 bpm. Exam Location:  [facility] Procedure: 2D Echo, Cardiac Doppler and Color Doppler Indications:     Acute coronary syndrome 124.9  History:         Patient has no prior history of Echocardiogram examinations.                  CAD; Risk Factors:Diabetes and Hypertension.  Sonographer:     Sherrie Sport RDCS (AE) Referring Phys:  [place] Diagnosing Phys: Isaias Cowman MD  Sonographer Comments: No apical window and no subcostal window. IMPRESSIONS  1. Left ventricular ejection fraction, by estimation, is 50 to 55%. The left ventricle has low normal function. The left ventricle has no regional wall motion abnormalities. Left ventricular diastolic parameters are indeterminate.  2. Right ventricular systolic function is normal. The right ventricular size is normal.  3. The mitral valve is normal in structure. Mild mitral valve regurgitation. No evidence of mitral stenosis.  4. The aortic valve is normal in structure. Aortic valve regurgitation is not visualized. No aortic stenosis is present.  5. The inferior vena cava is normal in size with greater than 50% respiratory variability, suggesting right atrial pressure of 3 mmHg. FINDINGS  Left Ventricle: Left ventricular ejection fraction, by estimation, is 50 to 55%. The left ventricle has low normal function. The left ventricle has no regional wall motion abnormalities. The left ventricular internal cavity size was normal in size. There is no left ventricular hypertrophy. Left ventricular diastolic  parameters are indeterminate. Right Ventricle: The right ventricular size is normal. No increase in right ventricular wall thickness. Right ventricular systolic function is normal. Left Atrium: Left atrial size was normal in size. Right Atrium: Right atrial size was normal in size. Pericardium: There is no evidence of pericardial effusion. Mitral Valve: The mitral valve is normal in structure. Normal mobility of the mitral valve leaflets. Mild mitral  valve regurgitation. No evidence of mitral valve stenosis. Tricuspid Valve: The tricuspid valve is normal in structure. Tricuspid valve regurgitation is mild . No evidence of tricuspid stenosis. Aortic Valve: The aortic valve is normal in structure. Aortic valve regurgitation is not visualized. No aortic stenosis is present. Pulmonic Valve: The pulmonic valve was normal in structure. Pulmonic valve regurgitation is not visualized. No evidence of pulmonic stenosis. Aorta: The aortic root is normal in size and structure. Venous: The inferior vena cava is normal in size with greater than 50% respiratory variability, suggesting right atrial pressure of 3 mmHg. IAS/Shunts: No atrial level shunt detected by color flow Doppler.  LEFT VENTRICLE PLAX 2D LVIDd:         4.62 cm LVIDs:         3.39 cm LV PW:         1.03 cm LV IVS:        1.18 cm LVOT diam:     2.10 cm LVOT Area:     3.46 cm  LEFT ATRIUM         Index LA diam:    4.50 cm 2.03 cm/m                        PULMONIC VALVE AORTA                 PV Vmax:        0.73 m/s Ao Root diam: 3.00 cm PV Peak grad:   2.1 mmHg                       RVOT Peak grad: 4 mmHg   SHUNTS Systemic Diam: 2.10 cm Marcina Millard MD Electronically signed by Marcina Millard MD Signature Date/Time: 11/28/2019/1:01:19 PM    Final      Labs:   Basic Metabolic Panel: Recent Labs  Lab 11/26/19 0346 11/26/19 0346 11/27/19 0515 11/27/19 0515 11/28/19 0026 11/28/19 0026 11/28/19 1033 11/29/19 0544  NA 134*  --  137  --  136   --  136 138  K 4.8   < > 4.9   < > 4.6   < > 5.0 4.6  CL 104  --  106  --  106  --  105 106  CO2 22  --  23  --  20*  --  23 24  GLUCOSE 190*  --  157*  --  152*  --  162* 152*  BUN 30*  --  31*  --  29*  --  26* 22  CREATININE 2.22*  --  2.04*  --  1.94*  --  1.77* 1.75*  CALCIUM 9.3  --  9.5  --  9.1  --  9.3 9.3   < > = values in this interval not displayed.   GFR Estimated Creatinine Clearance: 45.1 mL/min (A) (by C-G formula based on SCr of 1.75 mg/dL (H)). Liver Function Tests: No results for input(s): AST, ALT, ALKPHOS, BILITOT, PROT, ALBUMIN in the last 168 hours. No results for input(s): LIPASE, AMYLASE in the last 168 hours. No results for input(s): AMMONIA in the last 168 hours. Coagulation profile Recent Labs  Lab 11/25/19 1846  INR 1.0    CBC: Recent Labs  Lab 11/25/19 1118 11/26/19 0346 11/27/19 0515 11/28/19 0026 11/29/19 0544  WBC 10.1 10.1 8.7 9.0 8.4  HGB 11.4* 11.0* 10.9* 10.2* 10.4*  HCT 32.8* 30.8* 32.5* 30.1* 31.3*  MCV 84.5 83.5 86.7  86.5 88.2  PLT 290 277 283 276 259   Cardiac Enzymes: No results for input(s): CKTOTAL, CKMB, CKMBINDEX, TROPONINI in the last 168 hours. BNP: Invalid input(s): POCBNP CBG: Recent Labs  Lab 11/27/19 2115 11/28/19 0736 11/28/19 1635 11/28/19 2200 11/29/19 0813  GLUCAP 155* 152* 169* 208* 141*   D-Dimer No results for input(s): DDIMER in the last 72 hours. Hgb A1c No results for input(s): HGBA1C in the last 72 hours. Lipid Profile No results for input(s): CHOL, HDL, LDLCALC, TRIG, CHOLHDL, LDLDIRECT in the last 72 hours. Thyroid function studies No results for input(s): TSH, T4TOTAL, T3FREE, THYROIDAB in the last 72 hours.  Invalid input(s): FREET3 Anemia work up No results for input(s): VITAMINB12, FOLATE, FERRITIN, TIBC, IRON, RETICCTPCT in the last 72 hours. Microbiology Recent Results (from the past 240 hour(s))  SARS Coronavirus 2 by RT PCR (hospital order, performed in [facility] hospital lab)  Nasopharyngeal Nasopharyngeal Swab     Status: None   Collection Time: 11/25/19  3:00 PM   Specimen: Nasopharyngeal Swab  Result Value Ref Range Status   SARS Coronavirus 2 NEGATIVE NEGATIVE Final    Comment: (NOTE) SARS-CoV-2 target nucleic acids are NOT DETECTED.  The SARS-CoV-2 RNA is generally detectable in upper and lower respiratory specimens during the acute phase of infection. The lowest concentration of SARS-CoV-2 viral copies this assay can detect is 250 copies / mL. A negative result does not preclude SARS-CoV-2 infection and should not be used as the sole basis for treatment or other patient management decisions.  A negative result may occur with improper specimen collection / handling, submission of specimen other than nasopharyngeal swab, presence of viral mutation(s) within the areas targeted by this assay, and inadequate number of viral copies (<250 copies / mL). A negative result must be combined with clinical observations, patient history, and epidemiological information.  Fact Sheet for Patients:   StrictlyIdeas.no  Fact Sheet for Healthcare Providers: BankingDealers.co.za  This test is not yet approved or  cleared by the [place] FDA and has been authorized for detection and/or diagnosis of SARS-CoV-2 by FDA under an Emergency Use Authorization (EUA).  This EUA will remain in effect (meaning this test can be used) for the duration of the COVID-19 declaration under Section 564(b)(1) of the Act, 21 U.S.C. section 360bbb-3(b)(1), unless the authorization is terminated or revoked sooner.  Performed at [facility], [address], [place], [place] [address]      Discharge Instructions:   Discharge Instructions    AMB Referral to Cardiac Rehabilitation - Phase II   Complete by: As directed    Diagnosis: NSTEMI   After initial evaluation and assessments completed: Virtual Based Care may be  provided alone or in conjunction with Phase 2 Cardiac Rehab based on patient barriers.: Yes   Diet - low sodium heart healthy   Complete by: As directed    Diet Carb Modified   Complete by: As directed    Increase activity slowly   Complete by: As directed      Allergies as of 11/29/2019   No Known Allergies     Medication List    STOP taking these medications   amLODipine 10 MG tablet Commonly known as: NORVASC     TAKE these medications   aspirin 81 MG chewable tablet Chew 1 tablet (81 mg total) by mouth daily.   atorvastatin 80 MG tablet Commonly known as: LIPITOR Take 80 mg by mouth daily.   blood glucose meter kit and supplies  Dispense based on patient and insurance preference. Use up to four times daily as directed. (FOR ICD-10 E10.9, E11.9).   clopidogrel 75 MG tablet Commonly known as: PLAVIX Take 1 tablet (75 mg total) by mouth daily.   insulin glargine 100 UNIT/ML Solostar Pen Commonly known as: LANTUS Inject 15 Units into the skin at bedtime.   Insulin Pen Needle 32G X 4 MM Misc 1 application by Does not apply route at bedtime.   lisinopril 10 MG tablet Commonly known as: ZESTRIL Take 1 tablet (10 mg total) by mouth daily.   metFORMIN 500 MG 24 hr tablet Commonly known as: GLUCOPHAGE-XR Take 1,000 mg by mouth daily with breakfast.   metoprolol tartrate 25 MG tablet Commonly known as: LOPRESSOR Take 1 tablet (25 mg total) by mouth 2 (two) times daily.   nitroGLYCERIN 0.4 MG SL tablet Commonly known as: NITROSTAT Place 0.4 mg under the tongue every 5 (five) minutes as needed for chest pain.   pantoprazole 40 MG tablet Commonly known as: Protonix Take 1 tablet (40 mg total) by mouth daily.       Follow-up Information    Callwood, Dwayne D, MD Follow up in 1 week(s).   Specialties: Cardiology, Internal Medicine Why: Follow up with Dr. Clayborn Bigness or Myrtie Soman, NP within 7-10 days of discharge.  Contact information: [place] [address] [phone]                Time coordinating discharge: 32 minutes  Signed:  Jennye Boroughs  [facility] 11/29/2019, 9:06 AM

## 2019-11-29 NOTE — Progress Notes (Signed)
[facility] Cardiology    SUBJECTIVE: Mr. Heslop is a 74 year old male with a past medical history significant for coronary artery disease s/p CABG with a LIMA to the LAD, SVG to OM1, and SVG to RCA, hyperlipidemia, and GERD who presented to the ED on 11/25/19 for an acute onset of chest pain.  High sensitivity troponin was 12, 63, 455, and 793 respectively. He underwent left heart catheterization with Dr. Ubaldo Glassing on 11/28/19 which revealed a patent LIMA to the LAD, patent SVG to OM1, and patent SVG to RCA.  Left circumflex had a 50% stenosed lesion in the mid to distal segment.  Medical management was recommended.   Since left heart cath, Mr. Grealish reports resolution in chest pain and denies shortness of breath, orthopnea, PND, or lower extremity swelling.  Despite bradycardia, he is asymptomatic and denies dizziness, lightheadedness, fatigue, or syncopal/presyncopal episodes.  He denies pain from right femoral access site and also denies bleeding or drainage.    Vitals:   11/28/19 1141 11/28/19 1635 11/28/19 1947 11/29/19 0522  BP: 104/64 (!) 134/55 (!) 111/57 (!) 111/58  Pulse: (!) 49 (!) 52 (!) 56 (!) 53  Resp: 18 18  20   Temp: 98 F (36.7 C) 98.4 F (36.9 C) 98.6 F (37 C) 97.7 F (36.5 C)  TempSrc: Oral Oral Oral   SpO2: 98% 98% 98% 96%  Weight:    109.3 kg  Height:         Intake/Output Summary (Last 24 hours) at 11/29/2019 0750 Last data filed at 11/29/2019 3893 Gross per 24 hour  Intake 1573.5 ml  Output 2375 ml  Net -801.5 ml      PHYSICAL EXAM  General: Well developed, well nourished, in no acute distress HEENT:  Normocephalic and atramatic Neck:  No JVD.  Lungs: Clear bilaterally to auscultation and percussion. Heart: Bradycardic, regular rhythm . Normal S1 and S2 without gallops or murmurs.  Abdomen: Bowel sounds are positive, abdomen soft and non-tender  Msk:  Back normal.  Normal strength and tone for age. Extremities: No clubbing, cyanosis or edema.  Right femoral access  site clean, dry, intact; no evidence of bleeding, edema, or ecchymosis.  Neuro: Alert and oriented X 3. Psych:  Good affect, responds appropriately   LABS: Basic Metabolic Panel: Recent Labs    11/28/19 1033 11/29/19 0544  NA 136 138  K 5.0 4.6  CL 105 106  CO2 23 24  GLUCOSE 162* 152*  BUN 26* 22  CREATININE 1.77* 1.75*  CALCIUM 9.3 9.3   Liver Function Tests: No results for input(s): AST, ALT, ALKPHOS, BILITOT, PROT, ALBUMIN in the last 72 hours. No results for input(s): LIPASE, AMYLASE in the last 72 hours. CBC: Recent Labs    11/28/19 0026 11/29/19 0544  WBC 9.0 8.4  HGB 10.2* 10.4*  HCT 30.1* 31.3*  MCV 86.5 88.2  PLT 276 259   Cardiac Enzymes: No results for input(s): CKTOTAL, CKMB, CKMBINDEX, TROPONINI in the last 72 hours. BNP: Invalid input(s): POCBNP D-Dimer: No results for input(s): DDIMER in the last 72 hours. Hemoglobin A1C: No results for input(s): HGBA1C in the last 72 hours. Fasting Lipid Panel: No results for input(s): CHOL, HDL, LDLCALC, TRIG, CHOLHDL, LDLDIRECT in the last 72 hours. Thyroid Function Tests: No results for input(s): TSH, T4TOTAL, T3FREE, THYROIDAB in the last 72 hours.  Invalid input(s): FREET3 Anemia Panel: No results for input(s): VITAMINB12, FOLATE, FERRITIN, TIBC, IRON, RETICCTPCT in the last 72 hours.  CARDIAC CATHETERIZATION  Result Date: 11/28/2019  Mid LM lesion is 25% stenosed.  Ost LAD lesion is 100% stenosed.  Ost Cx lesion is 50% stenosed.  Prox Cx lesion is 50% stenosed.  Dist Cx lesion is 50% stenosed.  Ost 1st Mrg lesion is 100% stenosed.  Ost 2nd Mrg lesion is 100% stenosed.  Ost RCA to Prox RCA lesion is 100% stenosed.  1st RPL lesion is 50% stenosed.  Patent lima to lad, patent svg to om and rca. Lcx-50% mid to distal. Medical managemnt to include dual antiplatelet therapy, atorvastatin at high intensity, beta blockers.   ECHOCARDIOGRAM COMPLETE  Result Date: 11/28/2019    ECHOCARDIOGRAM REPORT    Patient Name:   SENDER RUEB Date of Exam: 11/28/2019 Medical Rec #:  [id number]        Height:       68.0 in Accession #:    [id number]       Weight:       242.7 lb Date of Birth:  01-03-1946        BSA:          2.219 m Patient Age:    5 years         BP:           140/82 mmHg Patient Gender: M                HR:           53 bpm. Exam Location:  [facility] Procedure: 2D Echo, Cardiac Doppler and Color Doppler Indications:     Acute coronary syndrome 124.9  History:         Patient has no prior history of Echocardiogram examinations.                  CAD; Risk Factors:Diabetes and Hypertension.  Sonographer:     Sherrie Sport RDCS (AE) Referring Phys:  [place] Diagnosing Phys: Isaias Cowman MD  Sonographer Comments: No apical window and no subcostal window. IMPRESSIONS  1. Left ventricular ejection fraction, by estimation, is 50 to 55%. The left ventricle has low normal function. The left ventricle has no regional wall motion abnormalities. Left ventricular diastolic parameters are indeterminate.  2. Right ventricular systolic function is normal. The right ventricular size is normal.  3. The mitral valve is normal in structure. Mild mitral valve regurgitation. No evidence of mitral stenosis.  4. The aortic valve is normal in structure. Aortic valve regurgitation is not visualized. No aortic stenosis is present.  5. The inferior vena cava is normal in size with greater than 50% respiratory variability, suggesting right atrial pressure of 3 mmHg. FINDINGS  Left Ventricle: Left ventricular ejection fraction, by estimation, is 50 to 55%. The left ventricle has low normal function. The left ventricle has no regional wall motion abnormalities. The left ventricular internal cavity size was normal in size. There is no left ventricular hypertrophy. Left ventricular diastolic parameters are indeterminate. Right Ventricle: The right ventricular size is normal. No increase in right ventricular wall thickness.  Right ventricular systolic function is normal. Left Atrium: Left atrial size was normal in size. Right Atrium: Right atrial size was normal in size. Pericardium: There is no evidence of pericardial effusion. Mitral Valve: The mitral valve is normal in structure. Normal mobility of the mitral valve leaflets. Mild mitral valve regurgitation. No evidence of mitral valve stenosis. Tricuspid Valve: The tricuspid valve is normal in structure. Tricuspid valve regurgitation is mild . No evidence of tricuspid stenosis. Aortic Valve: The aortic valve  is normal in structure. Aortic valve regurgitation is not visualized. No aortic stenosis is present. Pulmonic Valve: The pulmonic valve was normal in structure. Pulmonic valve regurgitation is not visualized. No evidence of pulmonic stenosis. Aorta: The aortic root is normal in size and structure. Venous: The inferior vena cava is normal in size with greater than 50% respiratory variability, suggesting right atrial pressure of 3 mmHg. IAS/Shunts: No atrial level shunt detected by color flow Doppler.  LEFT VENTRICLE PLAX 2D LVIDd:         4.62 cm LVIDs:         3.39 cm LV PW:         1.03 cm LV IVS:        1.18 cm LVOT diam:     2.10 cm LVOT Area:     3.46 cm  LEFT ATRIUM         Index LA diam:    4.50 cm 2.03 cm/m                        PULMONIC VALVE AORTA                 PV Vmax:        0.73 m/s Ao Root diam: 3.00 cm PV Peak grad:   2.1 mmHg                       RVOT Peak grad: 4 mmHg   SHUNTS Systemic Diam: 2.10 cm Isaias Cowman MD Electronically signed by Isaias Cowman MD Signature Date/Time: 11/28/2019/1:01:19 PM    Final      Echo:  Normal RV and low normal LV systolic function with an EF estimated between 50-55% with mild MR.   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  Coronary artery disease s/p CABG  -Left heart cath here on admission revealing patent grafts with mild, non-obstructive disease in the left circumflex   -Will treat medically and continue with aspirin 81mg  daily, atorvastatin 80mg  daily, and lisinopril 10mg  daily   -Will add metoprolol 25mg  BID and Plavix 75mg  daily   -Will discontinue amlodipine and follow BP in an outpatient setting; will resume then if warranted   -Okay to discharge from a cardiac standpoint; recommend follow up with Dr. Clayborn Bigness or Gladstone Pih NP within 7-10 days of discharge   2.  Hypertension   -Metoprolol added, amlodipine discontinued   -Continue lisinopril 10mg  daily   -Will follow in an outpatient setting and resume amlodipine if warranted   3.  Hyperlipidemia   -Continue atorvastatin 80mg  daily   The history, physical exam findings, and plan of care were all discussed with Dr. Bartholome Bill, and all decision making was made in collaboration.   Avie Arenas  PA-C 11/29/2019 7:50 AM

## 2019-11-29 NOTE — Care Management Important Message (Signed)
Important Message  Patient Details  Name: Justin Keith MRN: [id number] Date of Birth: 1945-08-27   Medicare Important Message Given:  Yes     Dannette Barbara 11/29/2019, 11:05 AM

## 2020-03-26 ENCOUNTER — Other Ambulatory Visit: Payer: Self-pay

## 2020-03-26 ENCOUNTER — Emergency Department: Payer: Medicare Other

## 2020-03-26 ENCOUNTER — Emergency Department
Admission: EM | Admit: 2020-03-26 | Discharge: 2020-03-26 | Disposition: A | Discharge status: Home / Self Care | Payer: Medicare Other | Attending: Emergency Medicine | Admitting: Emergency Medicine

## 2020-03-26 ENCOUNTER — Encounter: Payer: Self-pay | Admitting: Emergency Medicine

## 2020-03-26 DIAGNOSIS — N183 Chronic kidney disease, stage 3 unspecified: Secondary | ICD-10-CM | POA: Diagnosis not present

## 2020-03-26 DIAGNOSIS — Z79899 Other long term (current) drug therapy: Secondary | ICD-10-CM | POA: Diagnosis not present

## 2020-03-26 DIAGNOSIS — R079 Chest pain, unspecified: Secondary | ICD-10-CM

## 2020-03-26 DIAGNOSIS — I129 Hypertensive chronic kidney disease with stage 1 through stage 4 chronic kidney disease, or unspecified chronic kidney disease: Secondary | ICD-10-CM | POA: Diagnosis not present

## 2020-03-26 DIAGNOSIS — I2510 Atherosclerotic heart disease of native coronary artery without angina pectoris: Secondary | ICD-10-CM | POA: Insufficient documentation

## 2020-03-26 DIAGNOSIS — Z7984 Long term (current) use of oral hypoglycemic drugs: Secondary | ICD-10-CM | POA: Insufficient documentation

## 2020-03-26 DIAGNOSIS — E1122 Type 2 diabetes mellitus with diabetic chronic kidney disease: Secondary | ICD-10-CM | POA: Diagnosis not present

## 2020-03-26 DIAGNOSIS — Z7982 Long term (current) use of aspirin: Secondary | ICD-10-CM | POA: Insufficient documentation

## 2020-03-26 DIAGNOSIS — F17220 Nicotine dependence, chewing tobacco, uncomplicated: Secondary | ICD-10-CM | POA: Diagnosis not present

## 2020-03-26 DIAGNOSIS — Z794 Long term (current) use of insulin: Secondary | ICD-10-CM | POA: Insufficient documentation

## 2020-03-26 LAB — BASIC METABOLIC PANEL
Anion gap: 9 (ref 5–15)
BUN: 16 mg/dL (ref 8–23)
CO2: 23 mmol/L (ref 22–32)
Calcium: 9.5 mg/dL (ref 8.9–10.3)
Chloride: 106 mmol/L (ref 98–111)
Creatinine, Ser: 1.66 mg/dL — ABNORMAL HIGH (ref 0.61–1.24)
GFR, Estimated: 43 mL/min — ABNORMAL LOW (ref >60)
Glucose, Bld: 145 mg/dL — ABNORMAL HIGH (ref 70–99)
Potassium: 4.2 mmol/L (ref 3.5–5.1)
Sodium: 138 mmol/L (ref 135–145)

## 2020-03-26 LAB — TROPONIN I (HIGH SENSITIVITY)
Troponin I (High Sensitivity): 8 ng/L (ref <18)
Troponin I (High Sensitivity): 9 ng/L (ref <18)

## 2020-03-26 LAB — CBC
HCT: 33.8 % — ABNORMAL LOW (ref 39.0–52.0)
Hemoglobin: 11.3 g/dL — ABNORMAL LOW (ref 13.0–17.0)
MCH: 30.2 pg (ref 26.0–34.0)
MCHC: 33.4 g/dL (ref 30.0–36.0)
MCV: 90.4 fL (ref 80.0–100.0)
Platelets: 320 10*3/uL (ref 150–400)
RBC: 3.74 MIL/uL — ABNORMAL LOW (ref 4.22–5.81)
RDW: 13.9 % (ref 11.5–15.5)
WBC: 9.8 10*3/uL (ref 4.0–10.5)
nRBC: 0 % (ref 0.0–0.2)

## 2020-03-26 NOTE — ED Notes (Addendum)
RN attempted to get IV access on pt. RN attempted with a 20G to the Left AC. RN was unable to obtain IV access.  IV catheter intact. Bleeding controlled.   Pt on cardiac, bp and pulse ox monitor.

## 2020-03-26 NOTE — ED Triage Notes (Signed)
Pt to ED via EMS from [facility] c/o chest pain tonight, left sided, non radiating, dull pain.  States was lying on left side and went to roll over to right and noted the pain then.  Denies n/v/d, denies SOB.  Pt took own 324 baby ASA and 1 SL nitro with some relief PTA of EMS.  Presents A&Ox4, skin WNL, chest rise even and unlabored, in NAD at this time.

## 2020-03-26 NOTE — Discharge Instructions (Addendum)
Your CT scan of the chest shows a 2 cm left renal lesion.  Please follow-up with your doctor for further imaging such as an ultrasound or MRI to further assess this lesion. Return to the emergency department for any chest pain shortness of breath, or any other symptom personally concerning to yourself.

## 2020-03-26 NOTE — ED Notes (Signed)
E-signature not working at this time. Pt verbalized understanding of D/C instructions, prescriptions and follow up care with no further questions at this time. Pt in NAD and ambulatory at time of D/C.  

## 2020-03-26 NOTE — ED Provider Notes (Signed)
[facility] Emergency Department Provider Note  Time seen: 7:43 AM  I have reviewed the triage vital signs and the nursing notes.   HISTORY  Chief Complaint Chest Pain   HPI Justin Keith is a 74 y.o. male with a past medical history of CAD, CKD, diabetes, gastric reflux, hypertension, hyperlipidemia, presents to the emergency department for chest pain.  According to the patient in bed last night he rolled over while rolling over developed a sharp pain in his left chest.  Patient states the pain has been somewhat constant since although much improved at this time.  He did take aspirin and nitroglycerin prior to arrival.  Patient denies any shortness of breath nausea or diaphoresis at any point.  No leg pain or swelling.   Past Medical History:  Diagnosis Date  . CAD (coronary artery disease)   . CKD (chronic kidney disease)   . Diabetes mellitus without complication ([place])   . GERD (gastroesophageal reflux disease)   . Hyperlipemia   . Hypertension     Patient Active Problem List   Diagnosis Date Noted  . Acute non-ST elevation myocardial infarction (NSTEMI) ([place]) 11/26/2019  . NSTEMI (non-ST elevated myocardial infarction) ([place]) 11/25/2019  . AKI (acute kidney injury) ([place]) 11/25/2019  . Chest pain with high risk of acute coronary syndrome 03/15/2019  . Problems with swallowing and mastication   . Chest pain in adult 10/22/2018  . HTN (hypertension) 09/15/2018  . HLD (hyperlipidemia) 09/15/2018  . GERD (gastroesophageal reflux disease) 09/15/2018  . CAD (coronary artery disease) 09/15/2018  . Uncontrolled secondary diabetes mellitus with stage 3 CKD (GFR 30-59) ([place]) 09/15/2018  . Chest pain 12/07/2015    Past Surgical History:  Procedure Laterality Date  . CARDIAC CATHETERIZATION    . CARDIAC SURGERY    . COLONOSCOPY WITH PROPOFOL N/A 02/25/2018   Procedure: COLONOSCOPY WITH PROPOFOL;  Surgeon: Jonathon Bellows, MD;  Location: [facility] ENDOSCOPY;   Service: Gastroenterology;  Laterality: N/A;  . COLONOSCOPY WITH PROPOFOL N/A 02/26/2018   Procedure: COLONOSCOPY WITH PROPOFOL;  Surgeon: Jonathon Bellows, MD;  Location: [facility] ENDOSCOPY;  Service: Gastroenterology;  Laterality: N/A;  . ESOPHAGOGASTRODUODENOSCOPY (EGD) WITH PROPOFOL N/A 01/18/2019   Procedure: ESOPHAGOGASTRODUODENOSCOPY (EGD) WITH PROPOFOL;  Surgeon: Lucilla Lame, MD;  Location: [facility] ENDOSCOPY;  Service: Endoscopy;  Laterality: N/A;  . FOREIGN BODY REMOVAL N/A 03/07/2018   Procedure: FOREIGN BODY REMOVAL;  Surgeon: Jonathon Bellows, MD;  Location: [facility] ENDOSCOPY;  Service: Gastroenterology;  Laterality: N/A;  . LEFT HEART CATH AND CORS/GRAFTS ANGIOGRAPHY N/A 08/07/2017   Procedure: LEFT HEART CATH AND CORS/GRAFTS ANGIOGRAPHY;  Surgeon: Yolonda Kida, MD;  Location: [place] CV LAB;  Service: Cardiovascular;  Laterality: N/A;  . LEFT HEART CATH AND CORS/GRAFTS ANGIOGRAPHY N/A 11/28/2019   Procedure: LEFT HEART CATH AND CORS/GRAFTS ANGIOGRAPHY;  Surgeon: Teodoro Spray, MD;  Location: [place] CV LAB;  Service: Cardiovascular;  Laterality: N/A;  . PROSTATE SURGERY    . SHOULDER ARTHROSCOPY      Prior to Admission medications   Medication Sig Start Date End Date Taking? Authorizing Provider  aspirin 81 MG chewable tablet Chew 1 tablet (81 mg total) by mouth daily. 12/08/15   Vaughan Basta, MD  atorvastatin (LIPITOR) 80 MG tablet Take 80 mg by mouth daily.     [provider]  blood glucose meter kit and supplies Dispense based on patient and insurance preference. Use up to four times daily as directed. (FOR ICD-10 E10.9, E11.9). 11/29/19   Jennye Boroughs, MD  clopidogrel (PLAVIX) 75 MG tablet Take 1 tablet (75 mg total) by mouth daily. 11/29/19   Avie Arenas, PA-C  insulin glargine (LANTUS) 100 UNIT/ML Solostar Pen Inject 15 Units into the skin at bedtime. 11/29/19   Jennye Boroughs, MD  Insulin Pen Needle 32G X 4 MM MISC 1 application by Does not apply route at  bedtime. 11/29/19   Jennye Boroughs, MD  lisinopril (PRINIVIL,ZESTRIL) 10 MG tablet Take 1 tablet (10 mg total) by mouth daily. 12/08/15   Vaughan Basta, MD  metFORMIN (GLUCOPHAGE-XR) 500 MG 24 hr tablet Take 1,000 mg by mouth daily with breakfast.     [provider]  metoprolol tartrate (LOPRESSOR) 25 MG tablet Take 1 tablet (25 mg total) by mouth 2 (two) times daily. 11/29/19   Jennye Boroughs, MD  nitroGLYCERIN (NITROSTAT) 0.4 MG SL tablet Place 0.4 mg under the tongue every 5 (five) minutes as needed for chest pain.    [provider]  pantoprazole (PROTONIX) 40 MG tablet Take 1 tablet (40 mg total) by mouth daily. 01/18/19 01/18/20  Lucilla Lame, MD    No Known Allergies  History reviewed. No pertinent family history.  Social History Social History   Tobacco Use  . Smoking status: Never Smoker  . Smokeless tobacco: Current User    Types: Chew  Vaping Use  . Vaping Use: Never used  Substance Use Topics  . Alcohol use: No  . Drug use: Not Currently    Review of Systems Constitutional: Negative for fever. Cardiovascular: Positive for left chest pain much improved. Respiratory: Negative for shortness of breath. Gastrointestinal: Negative for abdominal pain, vomiting and diarrhea. Genitourinary: Negative for urinary compaints Musculoskeletal: Negative for musculoskeletal complaints Skin: Negative for skin complaints  Neurological: Negative for headache All other ROS negative  ____________________________________________   PHYSICAL EXAM:  VITAL SIGNS: ED Triage Vitals  Enc Vitals Group     BP 03/26/20 0555 117/72     Pulse Rate 03/26/20 0555 65     Resp 03/26/20 0555 16     Temp 03/26/20 0555 98.8 F (37.1 C)     Temp Source 03/26/20 0555 Oral     SpO2 03/26/20 0555 96 %     Weight 03/26/20 0552 250 lb (113.4 kg)     Height 03/26/20 0552 $RemoveBefor'5\' 8"'uYgnbXmDaGuA$  (1.727 m)     Head Circumference --      Peak Flow --      Pain Score 03/26/20 0551 6     Pain Loc  --      Pain Edu? --      Excl. in [place]? --     Constitutional: Alert and oriented. Well appearing and in no distress. Eyes: Normal exam ENT      Head: Normocephalic and atraumatic.      Mouth/Throat: Mucous membranes are moist. Cardiovascular: Normal rate, regular rhythm. Respiratory: Normal respiratory effort without tachypnea nor retractions. Breath sounds are clear Gastrointestinal: Soft and nontender. No distention. Musculoskeletal: Nontender with normal range of motion in all extremities. No lower extremity tenderness or edema. Neurologic:  Normal speech and language. No gross focal neurologic deficits Skin:  Skin is warm, dry and intact.  Psychiatric: Mood and affect are normal.   ____________________________________________    EKG  EKG viewed and interpreted by myself shows sinus rhythm at 66 bpm with a narrow QRS, normal axis, largely normal intervals, nonspecific ST changes  ____________________________________________    RADIOLOGY  Chest x-ray is negative besides a nodular opacity on the left heart  border. CT scan shows no acute findings.  Incidental 2 cm left renal lesion.  ____________________________________________   INITIAL IMPRESSION / ASSESSMENT AND PLAN / ED COURSE  Pertinent labs & imaging results that were available during my care of the patient were reviewed by me and considered in my medical decision making (see chart for details).   Patient presents emergency department for left chest pain that occurred after rolling over in bed.  Patient states very mild chest discomfort currently although much improved from earlier.  Patient thinks he pulled a muscle but states he wanted to be safe so he came to the emergency department.  No nausea shortness of breath or diaphoresis.  Chest wall is nontender to palpation on my exam.  X-ray shows a possible nodular opacity, follow-up CT however shows no acute findings, incidental 2 cm left renal lesion discussed with  patient for PCP follow-up.  We will repeat a troponin.  If the patient's repeat troponin remains negative anticipate likely discharge home.  Lab work is largely at baseline otherwise for the patient including CKD per record review.  Patient's repeat troponin is negative.  Discussed CT findings with the patient.  We will discharge home with PCP follow-up.  [name] was evaluated in Emergency Department on 03/26/2020 for the symptoms described in the history of present illness. He was evaluated in the context of the global COVID-19 pandemic, which necessitated consideration that the patient might be at risk for infection with the SARS-CoV-2 virus that causes COVID-19. Institutional protocols and algorithms that pertain to the evaluation of patients at risk for COVID-19 are in a state of rapid change based on information released by regulatory bodies including the CDC and federal and state organizations. These policies and algorithms were followed during the patient's care in the ED.  ____________________________________________   FINAL CLINICAL IMPRESSION(S) / ED DIAGNOSES  Chest pain   Harvest Dark, MD 03/26/20 1048

## 2020-04-23 ENCOUNTER — Other Ambulatory Visit: Payer: Self-pay | Admitting: Nephrology

## 2020-04-23 DIAGNOSIS — N1832 Chronic kidney disease, stage 3b: Secondary | ICD-10-CM

## 2020-04-23 DIAGNOSIS — E1122 Type 2 diabetes mellitus with diabetic chronic kidney disease: Secondary | ICD-10-CM

## 2020-05-01 ENCOUNTER — Other Ambulatory Visit: Payer: Self-pay

## 2020-05-01 ENCOUNTER — Ambulatory Visit
Admission: RE | Admit: 2020-05-01 | Discharge: 2020-05-01 | Disposition: A | Discharge status: Home / Self Care | Payer: Medicare Other | Source: Ambulatory Visit | Attending: Nephrology | Admitting: Nephrology

## 2020-05-01 DIAGNOSIS — N1832 Chronic kidney disease, stage 3b: Secondary | ICD-10-CM | POA: Diagnosis present

## 2020-05-01 DIAGNOSIS — E1122 Type 2 diabetes mellitus with diabetic chronic kidney disease: Secondary | ICD-10-CM | POA: Diagnosis present

## 2020-07-11 ENCOUNTER — Other Ambulatory Visit: Payer: Medicare Other

## 2020-07-11 DIAGNOSIS — Z20822 Contact with and (suspected) exposure to covid-19: Secondary | ICD-10-CM

## 2020-07-12 LAB — SARS-COV-2, NAA 2 DAY TAT

## 2020-07-12 LAB — NOVEL CORONAVIRUS, NAA: SARS-CoV-2, NAA: NOT DETECTED

## 2020-12-17 IMAGING — DX CHEST  1 VIEW
1 series · 1 of 1 positions shown · non-contrast
Comparison: 09/15/2018

CLINICAL DATA: Chest pain

EXAM:
CHEST  1 VIEW

[chest ap]
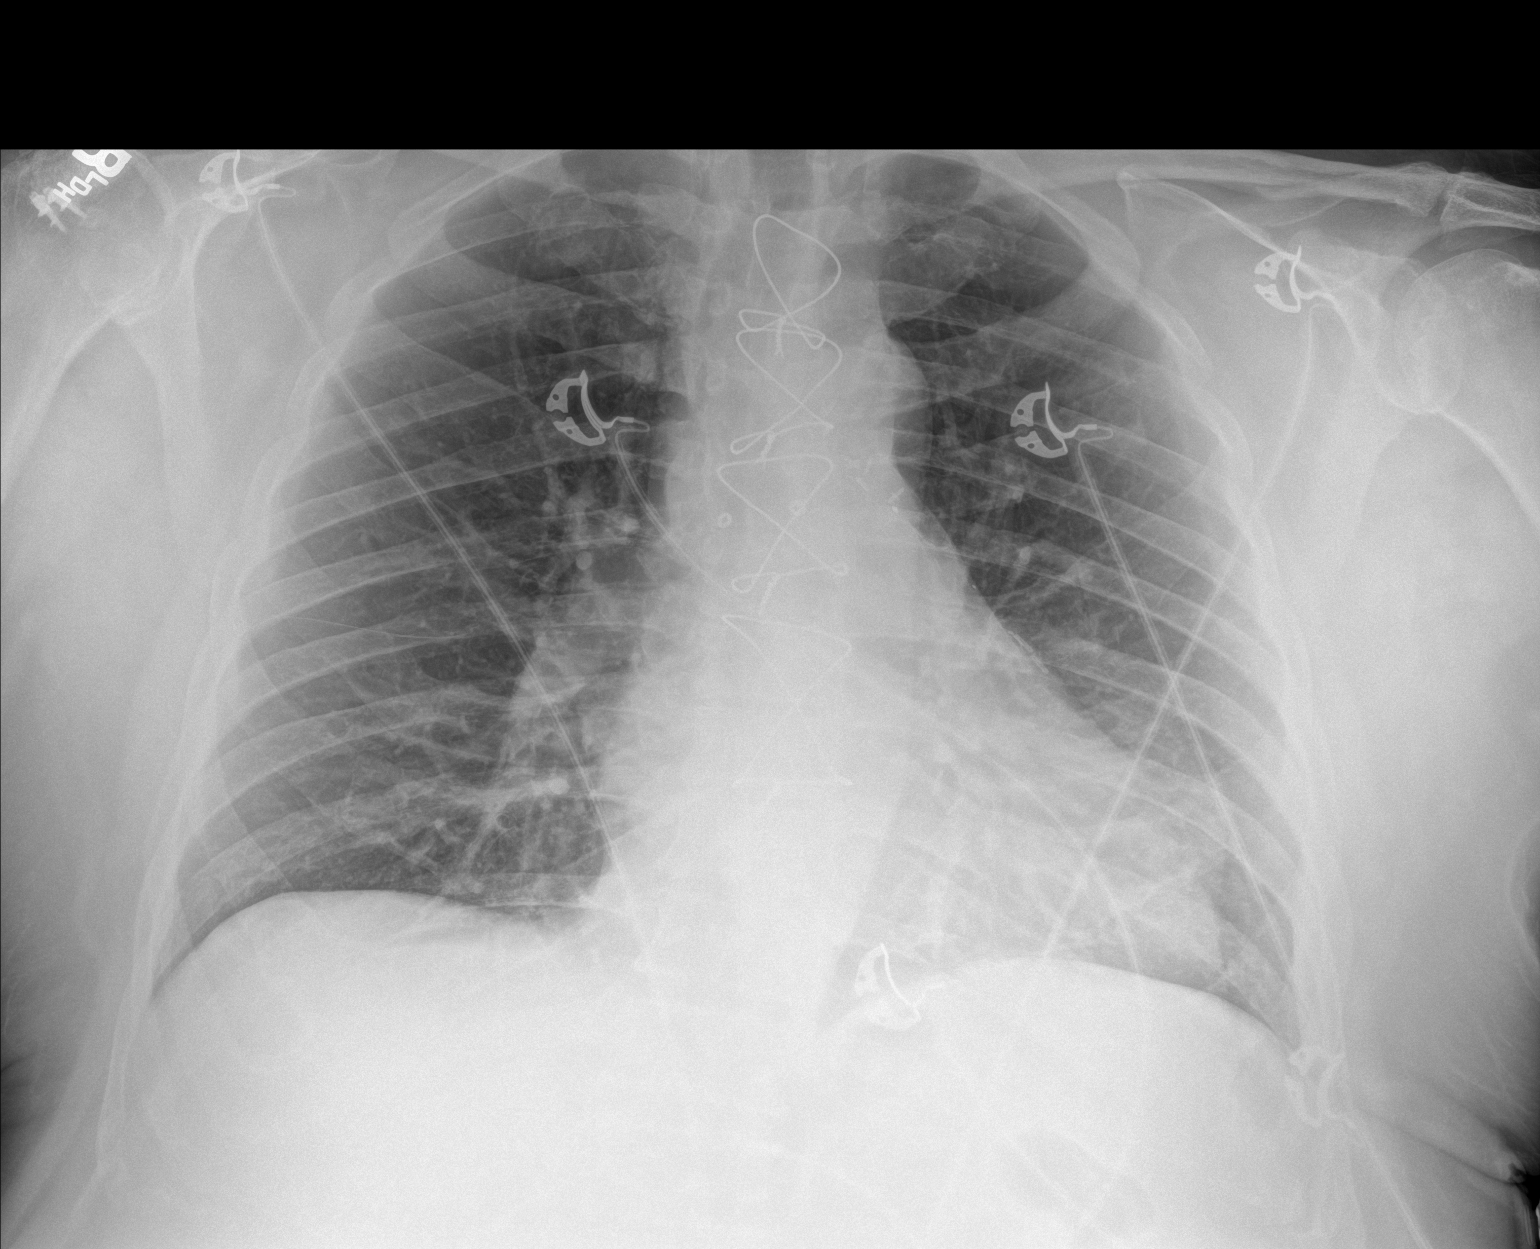

[1 of 1 positions shown; findings below may reference images not displayed]

FINDINGS: The cardiac silhouette is mildly enlarged. The patient is status
post prior median sternotomy. The lungs are essentially clear. There
is no pneumothorax. No large pleural effusion.
IMPRESSION: No active disease.

## 2021-01-10 ENCOUNTER — Emergency Department: Payer: Medicare Other

## 2021-01-10 ENCOUNTER — Emergency Department
Admission: EM | Admit: 2021-01-10 | Discharge: 2021-01-10 | Disposition: A | Discharge status: Home / Self Care | Payer: Medicare Other | Attending: Emergency Medicine | Admitting: Emergency Medicine

## 2021-01-10 DIAGNOSIS — Z20822 Contact with and (suspected) exposure to covid-19: Secondary | ICD-10-CM | POA: Diagnosis not present

## 2021-01-10 DIAGNOSIS — Z7984 Long term (current) use of oral hypoglycemic drugs: Secondary | ICD-10-CM | POA: Insufficient documentation

## 2021-01-10 DIAGNOSIS — Z79899 Other long term (current) drug therapy: Secondary | ICD-10-CM | POA: Insufficient documentation

## 2021-01-10 DIAGNOSIS — E1122 Type 2 diabetes mellitus with diabetic chronic kidney disease: Secondary | ICD-10-CM | POA: Insufficient documentation

## 2021-01-10 DIAGNOSIS — Z951 Presence of aortocoronary bypass graft: Secondary | ICD-10-CM | POA: Insufficient documentation

## 2021-01-10 DIAGNOSIS — Z7982 Long term (current) use of aspirin: Secondary | ICD-10-CM | POA: Insufficient documentation

## 2021-01-10 DIAGNOSIS — N183 Chronic kidney disease, stage 3 unspecified: Secondary | ICD-10-CM | POA: Diagnosis not present

## 2021-01-10 DIAGNOSIS — I129 Hypertensive chronic kidney disease with stage 1 through stage 4 chronic kidney disease, or unspecified chronic kidney disease: Secondary | ICD-10-CM | POA: Insufficient documentation

## 2021-01-10 DIAGNOSIS — Z794 Long term (current) use of insulin: Secondary | ICD-10-CM | POA: Insufficient documentation

## 2021-01-10 DIAGNOSIS — F17220 Nicotine dependence, chewing tobacco, uncomplicated: Secondary | ICD-10-CM | POA: Diagnosis not present

## 2021-01-10 DIAGNOSIS — Z7902 Long term (current) use of antithrombotics/antiplatelets: Secondary | ICD-10-CM | POA: Diagnosis not present

## 2021-01-10 DIAGNOSIS — I208 Other forms of angina pectoris: Secondary | ICD-10-CM

## 2021-01-10 DIAGNOSIS — I25118 Atherosclerotic heart disease of native coronary artery with other forms of angina pectoris: Secondary | ICD-10-CM | POA: Diagnosis not present

## 2021-01-10 DIAGNOSIS — Y900 Blood alcohol level of less than 20 mg/100 ml: Secondary | ICD-10-CM | POA: Insufficient documentation

## 2021-01-10 DIAGNOSIS — R079 Chest pain, unspecified: Secondary | ICD-10-CM

## 2021-01-10 DIAGNOSIS — R0789 Other chest pain: Secondary | ICD-10-CM | POA: Diagnosis present

## 2021-01-10 LAB — CBC WITH DIFFERENTIAL/PLATELET
Abs Immature Granulocytes: 0.04 10*3/uL (ref 0.00–0.07)
Basophils Absolute: 0.1 10*3/uL (ref 0.0–0.1)
Basophils Relative: 1 %
Eosinophils Absolute: 0.4 10*3/uL (ref 0.0–0.5)
Eosinophils Relative: 4 %
HCT: 36.1 % — ABNORMAL LOW (ref 39.0–52.0)
Hemoglobin: 12.4 g/dL — ABNORMAL LOW (ref 13.0–17.0)
Immature Granulocytes: 0 %
Lymphocytes Relative: 23 %
Lymphs Abs: 2.3 10*3/uL (ref 0.7–4.0)
MCH: 30.2 pg (ref 26.0–34.0)
MCHC: 34.3 g/dL (ref 30.0–36.0)
MCV: 88 fL (ref 80.0–100.0)
Monocytes Absolute: 0.8 10*3/uL (ref 0.1–1.0)
Monocytes Relative: 8 %
Neutro Abs: 6.3 10*3/uL (ref 1.7–7.7)
Neutrophils Relative %: 64 %
Platelets: 347 10*3/uL (ref 150–400)
RBC: 4.1 MIL/uL — ABNORMAL LOW (ref 4.22–5.81)
RDW: 13.4 % (ref 11.5–15.5)
WBC: 9.9 10*3/uL (ref 4.0–10.5)
nRBC: 0 % (ref 0.0–0.2)

## 2021-01-10 LAB — COMPREHENSIVE METABOLIC PANEL
ALT: 21 U/L (ref 0–44)
AST: 21 U/L (ref 15–41)
Albumin: 3.7 g/dL (ref 3.5–5.0)
Alkaline Phosphatase: 49 U/L (ref 38–126)
Anion gap: 10 (ref 5–15)
BUN: 24 mg/dL — ABNORMAL HIGH (ref 8–23)
CO2: 23 mmol/L (ref 22–32)
Calcium: 9.5 mg/dL (ref 8.9–10.3)
Chloride: 99 mmol/L (ref 98–111)
Creatinine, Ser: 1.61 mg/dL — ABNORMAL HIGH (ref 0.61–1.24)
GFR, Estimated: 45 mL/min — ABNORMAL LOW (ref >60)
Glucose, Bld: 294 mg/dL — ABNORMAL HIGH (ref 70–99)
Potassium: 4.7 mmol/L (ref 3.5–5.1)
Sodium: 132 mmol/L — ABNORMAL LOW (ref 135–145)
Total Bilirubin: 0.6 mg/dL (ref 0.3–1.2)
Total Protein: 7.7 g/dL (ref 6.5–8.1)

## 2021-01-10 LAB — RESP PANEL BY RT-PCR (FLU A&B, COVID) ARPGX2
Influenza A by PCR: NEGATIVE
Influenza B by PCR: NEGATIVE
SARS Coronavirus 2 by RT PCR: NEGATIVE

## 2021-01-10 LAB — ETHANOL: Alcohol, Ethyl (B): <10 mg/dL (ref <10)

## 2021-01-10 LAB — LIPASE, BLOOD: Lipase: 48 U/L (ref 11–51)

## 2021-01-10 LAB — TROPONIN I (HIGH SENSITIVITY)
Troponin I (High Sensitivity): 14 ng/L (ref <18)
Troponin I (High Sensitivity): 14 ng/L (ref <18)

## 2021-01-10 MED ORDER — NITROGLYCERIN 2 % TD OINT
1.0000 [in_us] | TOPICAL_OINTMENT | Freq: Once | TRANSDERMAL | Status: AC
  Administered 2021-01-10: 1 [in_us] via TOPICAL
  Filled 2021-01-10: qty 1

## 2021-01-10 MED ORDER — MORPHINE SULFATE (PF) 4 MG/ML IV SOLN: 4.0000 mg | Freq: Once | INTRAVENOUS | Status: DC

## 2021-01-10 MED ORDER — NITROGLYCERIN 0.4 MG SL SUBL: 0.4000 mg | SUBLINGUAL_TABLET | SUBLINGUAL | 0 refills | Status: AC | PRN

## 2021-01-10 NOTE — Discharge Instructions (Addendum)
You may take Nitroglycerin as directed by your cardiologist.   ER for worsening symptoms, persistent vomiting, difficulty breathing or other concerns.

## 2021-01-10 NOTE — ED Provider Notes (Signed)
[facility] Emergency Department Provider Note   ____________________________________________   Event Date/Time   First MD Initiated Contact with Patient 01/10/21 [phone]     (approximate)  I have reviewed the triage vital signs and the nursing notes.   HISTORY  Chief Complaint Chest Pain (Pt took 324mg  ASA at home.)    HPI Justin Keith is a 74 y.o. male brought to the ED via EMS from home with a chief complaint of chest pain.  Patient has a history of CAD status post CABG, CKD, diabetes, GERD, hypertension, hyperlipidemia.  Patient was getting ready for bed when he felt left-sided chest pressure, nonradiating.  Feels like anginal equivalent but his nitroglycerin are out of date so he did not take any denies associated diaphoresis, shortness of breath, nausea/vomiting or palpitations.  Denies recent fever, cough, abdominal pain, dysuria or diarrhea.     Past Medical History:  Diagnosis Date   CAD (coronary artery disease)    CKD (chronic kidney disease)    Diabetes mellitus without complication (HCC)    GERD (gastroesophageal reflux disease)    Hyperlipemia    Hypertension     Patient Active Problem List   Diagnosis Date Noted   Acute non-ST elevation myocardial infarction (NSTEMI) (HCC) 11/26/2019   NSTEMI (non-ST elevated myocardial infarction) (HCC) 11/25/2019   AKI (acute kidney injury) (HCC) 11/25/2019   Chest pain with high risk of acute coronary syndrome 03/15/2019   Problems with swallowing and mastication    Chest pain in adult 10/22/2018   HTN (hypertension) 09/15/2018   HLD (hyperlipidemia) 09/15/2018   GERD (gastroesophageal reflux disease) 09/15/2018   CAD (coronary artery disease) 09/15/2018   Uncontrolled secondary diabetes mellitus with stage 3 CKD (GFR 30-59) (HCC) 09/15/2018   Chest pain 12/07/2015    Past Surgical History:  Procedure Laterality Date   CARDIAC CATHETERIZATION     CARDIAC SURGERY     COLONOSCOPY WITH  PROPOFOL N/A 02/25/2018   Procedure: COLONOSCOPY WITH PROPOFOL;  Surgeon: 02/27/2018, MD;  Location: [facility] ENDOSCOPY;  Service: Gastroenterology;  Laterality: N/A;   COLONOSCOPY WITH PROPOFOL N/A 02/26/2018   Procedure: COLONOSCOPY WITH PROPOFOL;  Surgeon: 02/28/2018, MD;  Location: [facility] ENDOSCOPY;  Service: Gastroenterology;  Laterality: N/A;   ESOPHAGOGASTRODUODENOSCOPY (EGD) WITH PROPOFOL N/A 01/18/2019   Procedure: ESOPHAGOGASTRODUODENOSCOPY (EGD) WITH PROPOFOL;  Surgeon: 01/20/2019, MD;  Location: [facility] ENDOSCOPY;  Service: Endoscopy;  Laterality: N/A;   FOREIGN BODY REMOVAL N/A 03/07/2018   Procedure: FOREIGN BODY REMOVAL;  Surgeon: 05/07/2018, MD;  Location: [facility] ENDOSCOPY;  Service: Gastroenterology;  Laterality: N/A;   LEFT HEART CATH AND CORS/GRAFTS ANGIOGRAPHY N/A 08/07/2017   Procedure: LEFT HEART CATH AND CORS/GRAFTS ANGIOGRAPHY;  Surgeon: 10/07/2017, MD;  Location: [facility] INVASIVE CV LAB;  Service: Cardiovascular;  Laterality: N/A;   LEFT HEART CATH AND CORS/GRAFTS ANGIOGRAPHY N/A 11/28/2019   Procedure: LEFT HEART CATH AND CORS/GRAFTS ANGIOGRAPHY;  Surgeon: 11/30/2019, MD;  Location: [facility] INVASIVE CV LAB;  Service: Cardiovascular;  Laterality: N/A;   PROSTATE SURGERY     SHOULDER ARTHROSCOPY      Prior to Admission medications   Medication Sig Start Date End Date Taking? Authorizing Provider  amLODipine (NORVASC) 10 MG tablet Take 10 mg by mouth daily. 12/26/19   [provider]  aspirin 81 MG chewable tablet Chew 1 tablet (81 mg total) by mouth daily. 12/08/15   02/08/16, MD  atorvastatin (LIPITOR) 80 MG tablet Take 80 mg by mouth daily.  [provider]  blood glucose meter kit and supplies Dispense based on patient and insurance preference. Use up to four times daily as directed. (FOR ICD-10 E10.9, E11.9). 11/29/19   Jennye Boroughs, MD  clopidogrel (PLAVIX) 75 MG tablet Take 1 tablet (75 mg total) by mouth daily. 11/29/19   Avie Arenas, PA-C  insulin glargine (LANTUS) 100 UNIT/ML Solostar Pen Inject 15 Units into the skin at bedtime. 11/29/19   Jennye Boroughs, MD  Insulin Pen Needle 32G X 4 MM MISC 1 application by Does not apply route at bedtime. 11/29/19   Jennye Boroughs, MD  lisinopril (PRINIVIL,ZESTRIL) 10 MG tablet Take 1 tablet (10 mg total) by mouth daily. 12/08/15   Vaughan Basta, MD  metFORMIN (GLUCOPHAGE-XR) 500 MG 24 hr tablet Take 1,000 mg by mouth daily with breakfast.     [provider]  metoprolol tartrate (LOPRESSOR) 25 MG tablet Take 1 tablet (25 mg total) by mouth 2 (two) times daily. 11/29/19   Jennye Boroughs, MD  nitroGLYCERIN (NITROSTAT) 0.4 MG SL tablet Place 1 tablet (0.4 mg total) under the tongue every 5 (five) minutes as needed for chest pain. 01/10/21   Paulette Blanch, MD  pantoprazole (PROTONIX) 40 MG tablet Take 1 tablet (40 mg total) by mouth daily. 01/18/19 01/18/20  Lucilla Lame, MD  VICTOZA 18 MG/3ML SOPN Inject into the skin. 12/26/19   [provider]    Allergies Patient has no known allergies.  No family history on file.  Social History Social History   Tobacco Use   Smoking status: Never   Smokeless tobacco: Current    Types: Chew  Vaping Use   Vaping Use: Never used  Substance Use Topics   Alcohol use: No   Drug use: Not Currently    Review of Systems  Constitutional: No fever/chills Eyes: No visual changes. ENT: No sore throat. Cardiovascular: Positive for chest pain. Respiratory: Denies shortness of breath. Gastrointestinal: No abdominal pain.  No nausea, no vomiting.  No diarrhea.  No constipation. Genitourinary: Negative for dysuria. Musculoskeletal: Negative for back pain. Skin: Negative for rash. Neurological: Negative for headaches, focal weakness or numbness.   ____________________________________________   PHYSICAL EXAM:  VITAL SIGNS: ED Triage Vitals  Enc Vitals Group     BP 01/10/21 0329 (!) 156/78     Pulse Rate 01/10/21 0329  66     Resp 01/10/21 0329 (!) 23     Temp 01/10/21 0329 98.7 F (37.1 C)     Temp Source 01/10/21 0329 Oral     SpO2 01/10/21 0326 95 %     Weight 01/10/21 0326 250 lb (113.4 kg)     Height 01/10/21 0326 $RemoveBefor'5\' 8"'ZyOCNznhfdzI$  (1.727 m)     Head Circumference --      Peak Flow --      Pain Score --      Pain Loc --      Pain Edu? --      Excl. in [place]? --     Constitutional: Alert and oriented.  Elderly appearing and in mild acute distress. Eyes: Conjunctivae are normal. PERRL. EOMI. Head: Atraumatic. Nose: No congestion/rhinnorhea. Mouth/Throat: Mucous membranes are moist.   Neck: No stridor.   Cardiovascular: Normal rate, regular rhythm. Grossly normal heart sounds.  Good peripheral circulation. Respiratory: Normal respiratory effort.  No retractions. Lungs CTAB. Gastrointestinal: Soft and nontender to light or deep palpation. No distention. No abdominal bruits. No CVA tenderness. Musculoskeletal: No lower extremity tenderness nor edema.  No joint  effusions. Neurologic:  Normal speech and language. No gross focal neurologic deficits are appreciated.  Skin:  Skin is warm, dry and intact. No rash noted. Psychiatric: Mood and affect are normal. Speech and behavior are normal.  ____________________________________________   LABS (all labs ordered are listed, but only abnormal results are displayed)  Labs Reviewed  CBC WITH DIFFERENTIAL/PLATELET - Abnormal; Notable for the following components:      Result Value   RBC 4.10 (*)    Hemoglobin 12.4 (*)    HCT 36.1 (*)    All other components within normal limits  COMPREHENSIVE METABOLIC PANEL - Abnormal; Notable for the following components:   Sodium 132 (*)    Glucose, Bld 294 (*)    BUN 24 (*)    Creatinine, Ser 1.61 (*)    GFR, Estimated 45 (*)    All other components within normal limits  RESP PANEL BY RT-PCR (FLU A&B, COVID) ARPGX2  LIPASE, BLOOD  ETHANOL  TROPONIN I (HIGH SENSITIVITY)  TROPONIN I (HIGH SENSITIVITY)    ____________________________________________  EKG  ED ECG REPORT I, Lindell Renfrew J, the attending physician, personally viewed and interpreted this ECG.   Date: 01/10/2021  EKG Time: 0328  Rate: 63  Rhythm: normal sinus rhythm  Axis: Normal  Intervals: Prolonged PR interval  ST&T Change: Nonspecific  ____________________________________________  RADIOLOGY I, Tsuruko Murtha J, personally viewed and evaluated these images (plain radiographs) as part of my medical decision making, as well as reviewing the written report by the radiologist.  ED MD interpretation: No acute cardiopulmonary process  Official radiology report(s): DG Chest Port 1 View  Result Date: 01/10/2021 CLINICAL DATA:  Chest pain EXAM: PORTABLE CHEST 1 VIEW COMPARISON:  03/26/2020 FINDINGS: Lungs are clear.  No pleural effusion or pneumothorax. The heart is normal in size. Postsurgical changes related to prior CABG. Median sternotomy. IMPRESSION: No evidence of acute cardiopulmonary disease. Electronically Signed   By: Julian Hy M.D.   On: 01/10/2021 03:53    ____________________________________________   PROCEDURES  Procedure(s) performed (including Critical Care):  .1-3 Lead EKG Interpretation  Date/Time: 01/10/2021 3:35 AM Performed by: Paulette Blanch, MD Authorized by: Paulette Blanch, MD     Interpretation: normal     ECG rate:  63   ECG rate assessment: normal     Rhythm: sinus rhythm     Ectopy: none     Conduction: normal   Comments:     Patient placed on cardiac monitor to evaluate for arrhythmias   ____________________________________________   INITIAL IMPRESSION / ASSESSMENT AND PLAN / ED COURSE  As part of my medical decision making, I reviewed the following data within the [place] notes reviewed and incorporated, Labs reviewed, EKG interpreted, Old chart reviewed, Radiograph reviewed, and Notes from prior ED visits     75 year old male presenting with chest  pain; history of CAD status post CABG. Differential diagnosis includes, but is not limited to, ACS, aortic dissection, pulmonary embolism, cardiac tamponade, pneumothorax, pneumonia, pericarditis, myocarditis, GI-related causes including esophagitis/gastritis, and musculoskeletal chest wall pain.     Will obtain chest pain work-up, chest x-ray; apply nitroglycerin paste.  Patient reports chest pain much better after taking 4 baby aspirin prior to arrival.  Clinical Course as of 01/10/21 0650  Thu Jan 10, 2021  0456 Patient resting in no acute distress.  Updated him of lab results thus far.  Voices no complaints of chest pain at this time.  Will repeat timed troponin. [JS]  [phone]  Updated patient on repeat negative troponin.  Voices no complaints of chest pain.  Will refill nitroglycerin tablets as they are expired and patient will follow up with his cardiologist Dr. Clayborn Bigness.  Strict return precautions given.  Patient verbalizes understanding and agrees with plan of care. [JS]    Clinical Course User Index [JS] Paulette Blanch, MD     ____________________________________________   FINAL CLINICAL IMPRESSION(S) / ED DIAGNOSES  Final diagnoses:  Chest pain, unspecified type  Stable angina [facility])     ED Discharge Orders          Ordered    nitroGLYCERIN (NITROSTAT) 0.4 MG SL tablet  Every 5 min PRN        01/10/21 0610             Note:  This document was prepared using Dragon voice recognition software and may include unintentional dictation errors.    Paulette Blanch, MD 01/10/21 [phone]

## 2021-04-12 ENCOUNTER — Inpatient Hospital Stay
Admission: EM | Admit: 2021-04-12 | Discharge: 2021-04-15 | DRG: 287 | Disposition: A | Discharge status: Home / Self Care | Payer: Medicare Other | Attending: Internal Medicine | Admitting: Internal Medicine

## 2021-04-12 ENCOUNTER — Other Ambulatory Visit: Payer: Self-pay

## 2021-04-12 ENCOUNTER — Emergency Department: Payer: Medicare Other

## 2021-04-12 DIAGNOSIS — Z7985 Long-term (current) use of injectable non-insulin antidiabetic drugs: Secondary | ICD-10-CM

## 2021-04-12 DIAGNOSIS — N1832 Chronic kidney disease, stage 3b: Secondary | ICD-10-CM | POA: Diagnosis present

## 2021-04-12 DIAGNOSIS — I429 Cardiomyopathy, unspecified: Secondary | ICD-10-CM | POA: Diagnosis present

## 2021-04-12 DIAGNOSIS — I200 Unstable angina: Secondary | ICD-10-CM | POA: Diagnosis not present

## 2021-04-12 DIAGNOSIS — Z7982 Long term (current) use of aspirin: Secondary | ICD-10-CM

## 2021-04-12 DIAGNOSIS — Z951 Presence of aortocoronary bypass graft: Secondary | ICD-10-CM

## 2021-04-12 DIAGNOSIS — I252 Old myocardial infarction: Secondary | ICD-10-CM

## 2021-04-12 DIAGNOSIS — K219 Gastro-esophageal reflux disease without esophagitis: Secondary | ICD-10-CM | POA: Diagnosis present

## 2021-04-12 DIAGNOSIS — E871 Hypo-osmolality and hyponatremia: Secondary | ICD-10-CM | POA: Diagnosis present

## 2021-04-12 DIAGNOSIS — I2510 Atherosclerotic heart disease of native coronary artery without angina pectoris: Secondary | ICD-10-CM | POA: Diagnosis present

## 2021-04-12 DIAGNOSIS — E1169 Type 2 diabetes mellitus with other specified complication: Secondary | ICD-10-CM | POA: Diagnosis present

## 2021-04-12 DIAGNOSIS — R778 Other specified abnormalities of plasma proteins: Secondary | ICD-10-CM

## 2021-04-12 DIAGNOSIS — I10 Essential (primary) hypertension: Secondary | ICD-10-CM

## 2021-04-12 DIAGNOSIS — E1165 Type 2 diabetes mellitus with hyperglycemia: Secondary | ICD-10-CM | POA: Diagnosis present

## 2021-04-12 DIAGNOSIS — I129 Hypertensive chronic kidney disease with stage 1 through stage 4 chronic kidney disease, or unspecified chronic kidney disease: Secondary | ICD-10-CM | POA: Diagnosis present

## 2021-04-12 DIAGNOSIS — I209 Angina pectoris, unspecified: Secondary | ICD-10-CM

## 2021-04-12 DIAGNOSIS — N189 Chronic kidney disease, unspecified: Secondary | ICD-10-CM

## 2021-04-12 DIAGNOSIS — Z7984 Long term (current) use of oral hypoglycemic drugs: Secondary | ICD-10-CM

## 2021-04-12 DIAGNOSIS — R079 Chest pain, unspecified: Secondary | ICD-10-CM

## 2021-04-12 DIAGNOSIS — Z20822 Contact with and (suspected) exposure to covid-19: Secondary | ICD-10-CM | POA: Diagnosis present

## 2021-04-12 DIAGNOSIS — E785 Hyperlipidemia, unspecified: Secondary | ICD-10-CM | POA: Diagnosis present

## 2021-04-12 DIAGNOSIS — I25110 Atherosclerotic heart disease of native coronary artery with unstable angina pectoris: Secondary | ICD-10-CM | POA: Diagnosis not present

## 2021-04-12 DIAGNOSIS — Z79899 Other long term (current) drug therapy: Secondary | ICD-10-CM

## 2021-04-12 DIAGNOSIS — I2582 Chronic total occlusion of coronary artery: Secondary | ICD-10-CM | POA: Diagnosis present

## 2021-04-12 DIAGNOSIS — I214 Non-ST elevation (NSTEMI) myocardial infarction: Secondary | ICD-10-CM | POA: Diagnosis present

## 2021-04-12 DIAGNOSIS — Z794 Long term (current) use of insulin: Secondary | ICD-10-CM

## 2021-04-12 DIAGNOSIS — Z7902 Long term (current) use of antithrombotics/antiplatelets: Secondary | ICD-10-CM

## 2021-04-12 DIAGNOSIS — E1122 Type 2 diabetes mellitus with diabetic chronic kidney disease: Secondary | ICD-10-CM | POA: Diagnosis present

## 2021-04-12 LAB — BASIC METABOLIC PANEL
Anion gap: 7 (ref 5–15)
BUN: 27 mg/dL — ABNORMAL HIGH (ref 8–23)
CO2: 22 mmol/L (ref 22–32)
Calcium: 9.6 mg/dL (ref 8.9–10.3)
Chloride: 100 mmol/L (ref 98–111)
Creatinine, Ser: 1.78 mg/dL — ABNORMAL HIGH (ref 0.61–1.24)
GFR, Estimated: 40 mL/min — ABNORMAL LOW (ref >60)
Glucose, Bld: 367 mg/dL — ABNORMAL HIGH (ref 70–99)
Potassium: 4.6 mmol/L (ref 3.5–5.1)
Sodium: 129 mmol/L — ABNORMAL LOW (ref 135–145)

## 2021-04-12 LAB — TROPONIN I (HIGH SENSITIVITY)
Troponin I (High Sensitivity): 45 ng/L — ABNORMAL HIGH (ref <18)
Troponin I (High Sensitivity): 59 ng/L — ABNORMAL HIGH (ref <18)

## 2021-04-12 LAB — CBG MONITORING, ED
Glucose-Capillary: 294 mg/dL — ABNORMAL HIGH (ref 70–99)
Glucose-Capillary: 353 mg/dL — ABNORMAL HIGH (ref 70–99)

## 2021-04-12 LAB — CBC
HCT: 34.8 % — ABNORMAL LOW (ref 39.0–52.0)
Hemoglobin: 11.6 g/dL — ABNORMAL LOW (ref 13.0–17.0)
MCH: 29.7 pg (ref 26.0–34.0)
MCHC: 33.3 g/dL (ref 30.0–36.0)
MCV: 89 fL (ref 80.0–100.0)
Platelets: 317 10*3/uL (ref 150–400)
RBC: 3.91 MIL/uL — ABNORMAL LOW (ref 4.22–5.81)
RDW: 13.9 % (ref 11.5–15.5)
WBC: 8.6 10*3/uL (ref 4.0–10.5)
nRBC: 0 % (ref 0.0–0.2)

## 2021-04-12 NOTE — ED Triage Notes (Signed)
Cough for two days, bg this week per pt has been 300-500 however, usually 120's, 309 per EMS; bp 188/70; 96%RA; pt sounds congested pr EMS, cp intermittently for 2 days. Alert and oriented, ambulatory in lobby, NAD.

## 2021-04-12 NOTE — ED Triage Notes (Signed)
Pt to ED for intermittent chest pain the past few days and hyperglycemia. NAD noted Pt initially stated he was not here for chest pain, was here for high blood sugar, when asked if has pain anywhere, pt points to chest.  Pt oriented to person, place .

## 2021-04-13 ENCOUNTER — Encounter: Payer: Self-pay | Admitting: Internal Medicine

## 2021-04-13 ENCOUNTER — Inpatient Hospital Stay
Admit: 2021-04-13 | Discharge: 2021-04-13 | Disposition: A | Discharge status: Home / Self Care | Payer: Medicare Other | Attending: Internal Medicine | Admitting: Internal Medicine

## 2021-04-13 DIAGNOSIS — I252 Old myocardial infarction: Secondary | ICD-10-CM | POA: Diagnosis not present

## 2021-04-13 DIAGNOSIS — Z951 Presence of aortocoronary bypass graft: Secondary | ICD-10-CM | POA: Diagnosis not present

## 2021-04-13 DIAGNOSIS — R079 Chest pain, unspecified: Secondary | ICD-10-CM

## 2021-04-13 DIAGNOSIS — E1165 Type 2 diabetes mellitus with hyperglycemia: Secondary | ICD-10-CM | POA: Diagnosis present

## 2021-04-13 DIAGNOSIS — N1831 Chronic kidney disease, stage 3a: Secondary | ICD-10-CM

## 2021-04-13 DIAGNOSIS — I429 Cardiomyopathy, unspecified: Secondary | ICD-10-CM

## 2021-04-13 DIAGNOSIS — Z7982 Long term (current) use of aspirin: Secondary | ICD-10-CM | POA: Diagnosis not present

## 2021-04-13 DIAGNOSIS — R778 Other specified abnormalities of plasma proteins: Secondary | ICD-10-CM

## 2021-04-13 DIAGNOSIS — Z794 Long term (current) use of insulin: Secondary | ICD-10-CM | POA: Diagnosis not present

## 2021-04-13 DIAGNOSIS — Z7985 Long-term (current) use of injectable non-insulin antidiabetic drugs: Secondary | ICD-10-CM | POA: Diagnosis not present

## 2021-04-13 DIAGNOSIS — Z20822 Contact with and (suspected) exposure to covid-19: Secondary | ICD-10-CM | POA: Diagnosis present

## 2021-04-13 DIAGNOSIS — N1832 Chronic kidney disease, stage 3b: Secondary | ICD-10-CM | POA: Diagnosis present

## 2021-04-13 DIAGNOSIS — E871 Hypo-osmolality and hyponatremia: Secondary | ICD-10-CM

## 2021-04-13 DIAGNOSIS — I214 Non-ST elevation (NSTEMI) myocardial infarction: Secondary | ICD-10-CM | POA: Diagnosis not present

## 2021-04-13 DIAGNOSIS — I2582 Chronic total occlusion of coronary artery: Secondary | ICD-10-CM | POA: Diagnosis present

## 2021-04-13 DIAGNOSIS — Z7984 Long term (current) use of oral hypoglycemic drugs: Secondary | ICD-10-CM | POA: Diagnosis not present

## 2021-04-13 DIAGNOSIS — I25110 Atherosclerotic heart disease of native coronary artery with unstable angina pectoris: Secondary | ICD-10-CM | POA: Diagnosis present

## 2021-04-13 DIAGNOSIS — Z79899 Other long term (current) drug therapy: Secondary | ICD-10-CM | POA: Diagnosis not present

## 2021-04-13 DIAGNOSIS — Z7902 Long term (current) use of antithrombotics/antiplatelets: Secondary | ICD-10-CM | POA: Diagnosis not present

## 2021-04-13 DIAGNOSIS — I10 Essential (primary) hypertension: Secondary | ICD-10-CM

## 2021-04-13 DIAGNOSIS — I129 Hypertensive chronic kidney disease with stage 1 through stage 4 chronic kidney disease, or unspecified chronic kidney disease: Secondary | ICD-10-CM | POA: Diagnosis present

## 2021-04-13 DIAGNOSIS — K219 Gastro-esophageal reflux disease without esophagitis: Secondary | ICD-10-CM | POA: Diagnosis present

## 2021-04-13 DIAGNOSIS — I209 Angina pectoris, unspecified: Secondary | ICD-10-CM | POA: Diagnosis not present

## 2021-04-13 DIAGNOSIS — E1122 Type 2 diabetes mellitus with diabetic chronic kidney disease: Secondary | ICD-10-CM | POA: Diagnosis present

## 2021-04-13 DIAGNOSIS — E1169 Type 2 diabetes mellitus with other specified complication: Secondary | ICD-10-CM | POA: Diagnosis present

## 2021-04-13 DIAGNOSIS — I200 Unstable angina: Secondary | ICD-10-CM | POA: Diagnosis present

## 2021-04-13 DIAGNOSIS — E785 Hyperlipidemia, unspecified: Secondary | ICD-10-CM | POA: Diagnosis present

## 2021-04-13 LAB — TROPONIN I (HIGH SENSITIVITY): Troponin I (High Sensitivity): 48 ng/L — ABNORMAL HIGH (ref <18)

## 2021-04-13 LAB — HEMOGLOBIN A1C
Hgb A1c MFr Bld: 11.3 % — ABNORMAL HIGH (ref 4.8–5.6)
Mean Plasma Glucose: 277.61 mg/dL

## 2021-04-13 LAB — ECHOCARDIOGRAM COMPLETE
AR max vel: 2.07 cm2
AV Peak grad: 7.4 mmHg
Ao pk vel: 1.36 m/s
Area-P 1/2: 3.12 cm2
Height: 68 in
S' Lateral: 3.5 cm
Weight: 4160 oz

## 2021-04-13 LAB — CBC
HCT: 32.8 % — ABNORMAL LOW (ref 39.0–52.0)
Hemoglobin: 11.2 g/dL — ABNORMAL LOW (ref 13.0–17.0)
MCH: 30.4 pg (ref 26.0–34.0)
MCHC: 34.1 g/dL (ref 30.0–36.0)
MCV: 88.9 fL (ref 80.0–100.0)
Platelets: 304 10*3/uL (ref 150–400)
RBC: 3.69 MIL/uL — ABNORMAL LOW (ref 4.22–5.81)
RDW: 14.1 % (ref 11.5–15.5)
WBC: 10.6 10*3/uL — ABNORMAL HIGH (ref 4.0–10.5)
nRBC: 0 % (ref 0.0–0.2)

## 2021-04-13 LAB — BASIC METABOLIC PANEL
Anion gap: 8 (ref 5–15)
BUN: 25 mg/dL — ABNORMAL HIGH (ref 8–23)
CO2: 22 mmol/L (ref 22–32)
Calcium: 9.3 mg/dL (ref 8.9–10.3)
Chloride: 102 mmol/L (ref 98–111)
Creatinine, Ser: 1.52 mg/dL — ABNORMAL HIGH (ref 0.61–1.24)
GFR, Estimated: 48 mL/min — ABNORMAL LOW (ref >60)
Glucose, Bld: 289 mg/dL — ABNORMAL HIGH (ref 70–99)
Potassium: 4.4 mmol/L (ref 3.5–5.1)
Sodium: 132 mmol/L — ABNORMAL LOW (ref 135–145)

## 2021-04-13 LAB — PROTIME-INR
INR: 1 (ref 0.8–1.2)
Prothrombin Time: 13.4 seconds (ref 11.4–15.2)

## 2021-04-13 LAB — URINALYSIS, COMPLETE (UACMP) WITH MICROSCOPIC
Bacteria, UA: NONE SEEN
Bilirubin Urine: NEGATIVE
Glucose, UA: >=500 mg/dL — AB
Hgb urine dipstick: NEGATIVE
Ketones, ur: NEGATIVE mg/dL
Leukocytes,Ua: NEGATIVE
Nitrite: NEGATIVE
Protein, ur: NEGATIVE mg/dL
Specific Gravity, Urine: 1.007 (ref 1.005–1.030)
pH: 5 (ref 5.0–8.0)

## 2021-04-13 LAB — LIPID PANEL
Cholesterol: 319 mg/dL — ABNORMAL HIGH (ref 0–200)
HDL: 37 mg/dL — ABNORMAL LOW (ref >40)
LDL Cholesterol: 218 mg/dL — ABNORMAL HIGH (ref 0–99)
Total CHOL/HDL Ratio: 8.6 RATIO
Triglycerides: 320 mg/dL — ABNORMAL HIGH (ref <150)
VLDL: 64 mg/dL — ABNORMAL HIGH (ref 0–40)

## 2021-04-13 LAB — CBG MONITORING, ED
Glucose-Capillary: 222 mg/dL — ABNORMAL HIGH (ref 70–99)
Glucose-Capillary: 284 mg/dL — ABNORMAL HIGH (ref 70–99)
Glucose-Capillary: 271 mg/dL — ABNORMAL HIGH (ref 70–99)
Glucose-Capillary: 192 mg/dL — ABNORMAL HIGH (ref 70–99)
Glucose-Capillary: 191 mg/dL — ABNORMAL HIGH (ref 70–99)

## 2021-04-13 LAB — HEPARIN LEVEL (UNFRACTIONATED)
Heparin Unfractionated: 0.35 IU/mL (ref 0.30–0.70)
Heparin Unfractionated: 0.33 IU/mL (ref 0.30–0.70)

## 2021-04-13 LAB — RESP PANEL BY RT-PCR (FLU A&B, COVID) ARPGX2
Influenza A by PCR: NEGATIVE
Influenza B by PCR: NEGATIVE
SARS Coronavirus 2 by RT PCR: NEGATIVE

## 2021-04-13 LAB — APTT: aPTT: 30 seconds (ref 24–36)

## 2021-04-13 MED ORDER — PERFLUTREN LIPID MICROSPHERE
1.0000 mL | INTRAVENOUS | Status: AC | PRN
  Administered 2021-04-13: 2 mL via INTRAVENOUS
  Filled 2021-04-13: qty 10

## 2021-04-13 MED ORDER — ONDANSETRON HCL 4 MG/2ML IJ SOLN: 4.0000 mg | Freq: Four times a day (QID) | INTRAMUSCULAR | Status: DC | PRN

## 2021-04-13 MED ORDER — INSULIN GLARGINE-YFGN 100 UNIT/ML ~~LOC~~ SOLN
20.0000 [IU] | Freq: Every day | SUBCUTANEOUS | Status: DC
Start: 2021-04-13 — End: 2021-04-15
  Administered 2021-04-13 – 2021-04-14 (×2): 20 [IU] via SUBCUTANEOUS
  Filled 2021-04-13 (×3): qty 0.2

## 2021-04-13 MED ORDER — CLOPIDOGREL BISULFATE 75 MG PO TABS
75.0000 mg | ORAL_TABLET | Freq: Every day | ORAL | Status: DC
  Administered 2021-04-13 – 2021-04-15 (×3): 75 mg via ORAL
  Filled 2021-04-13 (×3): qty 1

## 2021-04-13 MED ORDER — INSULIN GLARGINE-YFGN 100 UNIT/ML ~~LOC~~ SOLN
15.0000 [IU] | Freq: Every day | SUBCUTANEOUS | Status: DC
  Filled 2021-04-13: qty 0.15

## 2021-04-13 MED ORDER — METOPROLOL TARTRATE 25 MG PO TABS
25.0000 mg | ORAL_TABLET | Freq: Two times a day (BID) | ORAL | Status: DC
  Administered 2021-04-14 – 2021-04-15 (×3): 25 mg via ORAL
  Filled 2021-04-13 (×3): qty 1

## 2021-04-13 MED ORDER — ACETAMINOPHEN 325 MG PO TABS: 650.0000 mg | ORAL_TABLET | ORAL | Status: DC | PRN

## 2021-04-13 MED ORDER — HEPARIN BOLUS VIA INFUSION
4000.0000 [IU] | Freq: Once | INTRAVENOUS | Status: AC
  Administered 2021-04-13: 4000 [IU] via INTRAVENOUS
  Filled 2021-04-13: qty 4000

## 2021-04-13 MED ORDER — HEPARIN (PORCINE) 25000 UT/250ML-% IV SOLN
1300.0000 [IU]/h | INTRAVENOUS | Status: DC
  Administered 2021-04-13 – 2021-04-14 (×2): 1300 [IU]/h via INTRAVENOUS
  Filled 2021-04-13 (×3): qty 250

## 2021-04-13 MED ORDER — INSULIN ASPART 100 UNIT/ML IJ SOLN
3.0000 [IU] | Freq: Three times a day (TID) | INTRAMUSCULAR | Status: DC
  Administered 2021-04-14 – 2021-04-15 (×4): 3 [IU] via SUBCUTANEOUS
  Filled 2021-04-13 (×4): qty 1

## 2021-04-13 MED ORDER — ATORVASTATIN CALCIUM 80 MG PO TABS
80.0000 mg | ORAL_TABLET | Freq: Every day | ORAL | Status: DC
  Administered 2021-04-13 – 2021-04-15 (×3): 80 mg via ORAL
  Filled 2021-04-13 (×2): qty 4
  Filled 2021-04-13: qty 1

## 2021-04-13 MED ORDER — ASPIRIN 81 MG PO CHEW
324.0000 mg | CHEWABLE_TABLET | Freq: Once | ORAL | Status: AC
  Administered 2021-04-13: 324 mg via ORAL
  Filled 2021-04-13: qty 4

## 2021-04-13 MED ORDER — ASPIRIN 81 MG PO CHEW
81.0000 mg | CHEWABLE_TABLET | Freq: Every day | ORAL | Status: DC
  Administered 2021-04-13 – 2021-04-14 (×2): 81 mg via ORAL
  Filled 2021-04-13 (×2): qty 1

## 2021-04-13 MED ORDER — SODIUM CHLORIDE 0.9 % IV BOLUS
500.0000 mL | Freq: Once | INTRAVENOUS | Status: AC
  Administered 2021-04-13: 500 mL via INTRAVENOUS

## 2021-04-13 MED ORDER — INSULIN ASPART 100 UNIT/ML IJ SOLN
0.0000 [IU] | Freq: Three times a day (TID) | INTRAMUSCULAR | Status: DC
  Administered 2021-04-13 (×2): 11 [IU] via SUBCUTANEOUS
  Administered 2021-04-13: 4 [IU] via SUBCUTANEOUS
  Administered 2021-04-14: 11 [IU] via SUBCUTANEOUS
  Administered 2021-04-14: 7 [IU] via SUBCUTANEOUS
  Administered 2021-04-15: 4 [IU] via SUBCUTANEOUS
  Filled 2021-04-13 (×5): qty 1

## 2021-04-13 MED ORDER — INSULIN ASPART 100 UNIT/ML IJ SOLN: 0.0000 [IU] | Freq: Every day | INTRAMUSCULAR | Status: DC

## 2021-04-13 MED ORDER — ISOSORBIDE MONONITRATE ER 30 MG PO TB24
30.0000 mg | ORAL_TABLET | Freq: Every day | ORAL | Status: DC
  Administered 2021-04-13 – 2021-04-15 (×3): 30 mg via ORAL
  Filled 2021-04-13 (×3): qty 1

## 2021-04-13 MED ORDER — NITROGLYCERIN 0.4 MG SL SUBL: 0.4000 mg | SUBLINGUAL_TABLET | SUBLINGUAL | Status: DC | PRN

## 2021-04-13 NOTE — H&P (Signed)
History and Physical    Justin Keith:[id number] DOB: [birth year] DOA: 04/12/2021  PCP: [facility]   Patient coming from: Home  I have personally briefly reviewed patient's relevant medical records in [place]  Chief Complaint: Chest pain, high blood sugar  HPI: Justin Keith is a 75 y.o. male with medical history significant for CKD 3B, GERD, DM, CAD s/p CABG, who presents to the ED for evaluation of left anterior chest pain, nonradiating of moderate intensity associated with exertion.  He denies nausea, vomiting or lightheadedness.  He noticed that for the past few days his blood sugars have also been very elevated in the 300s whereas typically gets in the 100s.  He denies cough, shortness of breath or fever or chills.  Patient was hospitalized in June 2021 with NSTEMI treated medically.  Cardiac cath at that time showed patent LIMA to LAD, SVG to OM1 and patent SVG to RCA but 50% stenosed lesion mid to distal left circumflex.  Recommendation was for medical management.  ED course: On arrival, mild bradycardia of 58 but otherwise vitals within normal limits Blood work with troponin 45> 59 Sodium 129, creatinine 1.78, which is his baseline.  Blood glucose 367  EKG, personally viewed and interpreted: NSR at 62 with no acute ST-T wave changes  Chest x-ray with no active cardiopulmonary disease  Patient started on heparin infusion for NSTEMI .  Hospitalist consulted for admission.    Review of Systems: As per HPI otherwise all other systems on review of systems negative.    Past Medical History:  Diagnosis Date   CAD (coronary artery disease)    CKD (chronic kidney disease)    Diabetes mellitus without complication (HCC)    GERD (gastroesophageal reflux disease)    Hyperlipemia    Hypertension     Past Surgical History:  Procedure Laterality Date   CARDIAC CATHETERIZATION     CARDIAC SURGERY     COLONOSCOPY WITH PROPOFOL N/A  02/25/2018   Procedure: COLONOSCOPY WITH PROPOFOL;  Surgeon: Jonathon Bellows, MD;  Location: [facility] ENDOSCOPY;  Service: Gastroenterology;  Laterality: N/A;   COLONOSCOPY WITH PROPOFOL N/A 02/26/2018   Procedure: COLONOSCOPY WITH PROPOFOL;  Surgeon: Jonathon Bellows, MD;  Location: [facility] ENDOSCOPY;  Service: Gastroenterology;  Laterality: N/A;   ESOPHAGOGASTRODUODENOSCOPY (EGD) WITH PROPOFOL N/A 01/18/2019   Procedure: ESOPHAGOGASTRODUODENOSCOPY (EGD) WITH PROPOFOL;  Surgeon: Lucilla Lame, MD;  Location: [facility] ENDOSCOPY;  Service: Endoscopy;  Laterality: N/A;   FOREIGN BODY REMOVAL N/A 03/07/2018   Procedure: FOREIGN BODY REMOVAL;  Surgeon: Jonathon Bellows, MD;  Location: [facility] ENDOSCOPY;  Service: Gastroenterology;  Laterality: N/A;   LEFT HEART CATH AND CORS/GRAFTS ANGIOGRAPHY N/A 08/07/2017   Procedure: LEFT HEART CATH AND CORS/GRAFTS ANGIOGRAPHY;  Surgeon: Yolonda Kida, MD;  Location: [facility] LAB;  Service: Cardiovascular;  Laterality: N/A;   LEFT HEART CATH AND CORS/GRAFTS ANGIOGRAPHY N/A 11/28/2019   Procedure: LEFT HEART CATH AND CORS/GRAFTS ANGIOGRAPHY;  Surgeon: Teodoro Spray, MD;  Location: [facility];  Service: Cardiovascular;  Laterality: N/A;   PROSTATE SURGERY     SHOULDER ARTHROSCOPY       reports that he has never smoked. His smokeless tobacco use includes chew. He reports that he does not currently use drugs. He reports that he does not drink alcohol.  No Known Allergies  History reviewed. No pertinent family history.    Prior to Admission medications   Medication Sig Start Date End Date Taking? Authorizing Provider  amLODipine (  NORVASC) 10 MG tablet Take 10 mg by mouth daily. 12/26/19   [provider]  aspirin 81 MG chewable tablet Chew 1 tablet (81 mg total) by mouth daily. 12/08/15   Vaughan Basta, MD  atorvastatin (LIPITOR) 80 MG tablet Take 80 mg by mouth daily.     [provider]  blood glucose meter kit and supplies Dispense based on  patient and insurance preference. Use up to four times daily as directed. (FOR ICD-10 E10.9, E11.9). 11/29/19   Jennye Boroughs, MD  clopidogrel (PLAVIX) 75 MG tablet Take 1 tablet (75 mg total) by mouth daily. 11/29/19   Avie Arenas, PA-C  insulin glargine (LANTUS) 100 UNIT/ML Solostar Pen Inject 15 Units into the skin at bedtime. 11/29/19   Jennye Boroughs, MD  Insulin Pen Needle 32G X 4 MM MISC 1 application by Does not apply route at bedtime. 11/29/19   Jennye Boroughs, MD  lisinopril (PRINIVIL,ZESTRIL) 10 MG tablet Take 1 tablet (10 mg total) by mouth daily. 12/08/15   Vaughan Basta, MD  metFORMIN (GLUCOPHAGE-XR) 500 MG 24 hr tablet Take 1,000 mg by mouth daily with breakfast.     [provider]  metoprolol tartrate (LOPRESSOR) 25 MG tablet Take 1 tablet (25 mg total) by mouth 2 (two) times daily. 11/29/19   Jennye Boroughs, MD  nitroGLYCERIN (NITROSTAT) 0.4 MG SL tablet Place 1 tablet (0.4 mg total) under the tongue every 5 (five) minutes as needed for chest pain. 01/10/21   Paulette Blanch, MD  pantoprazole (PROTONIX) 40 MG tablet Take 1 tablet (40 mg total) by mouth daily. 01/18/19 01/18/20  Lucilla Lame, MD  VICTOZA 18 MG/3ML SOPN Inject into the skin. 12/26/19   [provider]    Physical Exam: Vitals:   04/12/21 1712 04/12/21 1717 04/12/21 2236 04/12/21 2355  BP: 134/69  117/67 (!) 132/58  Pulse: (!) 58  (!) 59 (!) 54  Resp: $Remo'18  16 19  'bsBOF$ Temp: 98.4 F (36.9 C)     TempSrc: Oral     SpO2: 96%  95% 100%  Weight:  117.9 kg    Height:  $Remove'5\' 8"'jNOurcC$  (1.727 m)     Constitutional: Alert and oriented x 3 . Not in any apparent distress HEENT:      Head: Normocephalic and atraumatic.         Eyes: PERLA, EOMI, Conjunctivae are normal. Sclera is non-icteric.       Mouth/Throat: Mucous membranes are moist.       Neck: Supple with no signs of meningismus. Cardiovascular: Regular rate and rhythm. No murmurs, gallops, or rubs. 2+ symmetrical distal pulses are present . No JVD. No   LE edema Respiratory: Respiratory effort normal .Lungs sounds clear bilaterally. No wheezes, crackles, or rhonchi.  Gastrointestinal: Soft, non tender, non distended. Positive bowel sounds.  Genitourinary: No CVA tenderness. Musculoskeletal: Nontender with normal range of motion in all extremities. No cyanosis, or erythema of extremities. Neurologic:  Face is symmetric. Moving all extremities. No gross focal neurologic deficits . Skin: Skin is warm, dry.  No rash or ulcers Psychiatric: Mood and affect are appropriate    Labs on Admission: I have personally reviewed following labs and imaging studies  CBC: Recent Labs  Lab 04/12/21 1716  WBC 8.6  HGB 11.6*  HCT 34.8*  MCV 89.0  PLT 150   Basic Metabolic Panel: Recent Labs  Lab 04/12/21 1716  NA 129*  K 4.6  CL 100  CO2 22  GLUCOSE 367*  BUN 27*  CREATININE 1.78*  CALCIUM 9.6   GFR: Estimated Creatinine Clearance: 45.4 mL/min (A) (by C-G formula based on SCr of 1.78 mg/dL (H)). Liver Function Tests: No results for input(s): AST, ALT, ALKPHOS, BILITOT, PROT, ALBUMIN in the last 168 hours. No results for input(s): LIPASE, AMYLASE in the last 168 hours. No results for input(s): AMMONIA in the last 168 hours. Coagulation Profile: No results for input(s): INR, PROTIME in the last 168 hours. Cardiac Enzymes: No results for input(s): CKTOTAL, CKMB, CKMBINDEX, TROPONINI in the last 168 hours. BNP (last 3 results) No results for input(s): PROBNP in the last 8760 hours. HbA1C: No results for input(s): HGBA1C in the last 72 hours. CBG: Recent Labs  Lab 04/12/21 1711 04/12/21 2354  GLUCAP 353* 294*   Lipid Profile: No results for input(s): CHOL, HDL, LDLCALC, TRIG, CHOLHDL, LDLDIRECT in the last 72 hours. Thyroid Function Tests: No results for input(s): TSH, T4TOTAL, FREET4, T3FREE, THYROIDAB in the last 72 hours. Anemia Panel: No results for input(s): VITAMINB12, FOLATE, FERRITIN, TIBC, IRON, RETICCTPCT in the last 72  hours. Urine analysis:    Component Value Date/Time   COLORURINE STRAW (A) 11/18/2019 0910   APPEARANCEUR HAZY (A) 11/18/2019 0910   LABSPEC 1.016 11/18/2019 0910   PHURINE 5.0 11/18/2019 0910   GLUCOSEU >=500 (A) 11/18/2019 0910   HGBUR NEGATIVE 11/18/2019 0910   BILIRUBINUR NEGATIVE 11/18/2019 0910   KETONESUR NEGATIVE 11/18/2019 0910   PROTEINUR NEGATIVE 11/18/2019 0910   NITRITE NEGATIVE 11/18/2019 0910   LEUKOCYTESUR NEGATIVE 11/18/2019 0910    Radiological Exams on Admission: DG Chest 2 View  Result Date: 04/12/2021 CLINICAL DATA:  Chest pain EXAM: CHEST - 2 VIEW COMPARISON:  Chest x-ray 01/10/2021, CT chest 03/26/2020 FINDINGS: The heart and mediastinal contours are unchanged. No focal consolidation. No pulmonary edema. No pleural effusion. No pneumothorax. No acute osseous abnormality. Right shoulder rotator cuff anchor suture. IMPRESSION: No active cardiopulmonary disease. Electronically Signed   By: Iven Finn M.D.   On: 04/12/2021 17:46    Assessment/Plan    NSTEMI (non-ST elevated myocardial    CAD s/p CABG -Heparin infusion - Received full dose aspirin in the ED - Continue daily aspirin, metoprolol, atorvastatin - Nitroglycerin sublingual as needed chest pain with morphine for breakthrough - Cardiology consult - Echo in the a.m.    Hyperglycemia due to type 2 diabetes mellitus (HCC) -Basal insulin - Sliding scale insulin coverage    HTN (hypertension) - Controlled - Continue home amlodipine, lisinopril and metoprolol    Stage 3b chronic kidney disease ([place]) - Renal function at baseline    DVT prophylaxis: Heparin infusion Code Status: full code  Family Communication:  none  Disposition Plan: Back to previous home environment Consults called: Cardiology Status:At the time of admission, it appears that the appropriate admission status for this patient is INPATIENT. This is judged to be reasonable and necessary in order to provide the required  intensity of service to ensure the patient's safety given the presenting symptoms, physical exam findings, and initial radiographic and laboratory data in the context of their  Comorbid conditions.   Patient requires inpatient status due to high intensity of service, high risk for further deterioration and high frequency of surveillance required.   I certify that at the point of admission it is my clinical judgment that the patient will require inpatient hospital care spanning beyond [place] MD [facility]   04/13/2021, 1:36 AM

## 2021-04-13 NOTE — Consult Note (Signed)
[facility] Cardiology Consultation Note  Patient ID: Justin Keith, MRN: [id number], DOB/AGE: May 23, 1946 75 y.o. Admit date: 04/12/2021   Date of Consult: 04/13/2021 Primary Physician: [facility] [place] Primary Cardiologist: [facility]  Chief Complaint:  Chief Complaint  Patient presents with   Chest Pain   Hyperglycemia   Reason for Consult:  Chest pain with coronary artery disease  HPI: 75 y.o. male with known coronary artery disease status post coronary bypass graft and minimal non-ST elevation myocardial infarction in 2021 for which she had a cardiac catheterization showing patent graft arteries and moderate atherosclerosis of an obtuse marginal artery not previously intervened upon.  At that time the patient most maximized on medication management and has done fairly well.  He now has concerns of chest discomfort weakness fatigue and shortness of breath with no evidence of over concerns of acute coronary syndrome.  The patient has had full resolution of chest discomfort with apparent hydration but also may be affected by heparin.  Currently troponin is 45/48+59 without evidence of consistent concerns for acute coronary syndrome.  Glomerular filtration rate has been 40/48 and improving with hydration.  Chest x-ray was normal with no evidence of heart failure.  EKG showed normal sinus rhythm otherwise normal EKG.  At this time there is no evidence of hemodynamic compromise  Past Medical History:  Diagnosis Date   CAD (coronary artery disease)    CKD (chronic kidney disease)    Diabetes mellitus without complication (HCC)    GERD (gastroesophageal reflux disease)    Hyperlipemia    Hypertension       Surgical History:  Past Surgical History:  Procedure Laterality Date   CARDIAC CATHETERIZATION     CARDIAC SURGERY     COLONOSCOPY WITH PROPOFOL N/A 02/25/2018   Procedure: COLONOSCOPY WITH PROPOFOL;  Surgeon: Jonathon Bellows, MD;  Location: [facility] ENDOSCOPY;   Service: Gastroenterology;  Laterality: N/A;   COLONOSCOPY WITH PROPOFOL N/A 02/26/2018   Procedure: COLONOSCOPY WITH PROPOFOL;  Surgeon: Jonathon Bellows, MD;  Location: [facility] ENDOSCOPY;  Service: Gastroenterology;  Laterality: N/A;   ESOPHAGOGASTRODUODENOSCOPY (EGD) WITH PROPOFOL N/A 01/18/2019   Procedure: ESOPHAGOGASTRODUODENOSCOPY (EGD) WITH PROPOFOL;  Surgeon: Lucilla Lame, MD;  Location: [facility] ENDOSCOPY;  Service: Endoscopy;  Laterality: N/A;   FOREIGN BODY REMOVAL N/A 03/07/2018   Procedure: FOREIGN BODY REMOVAL;  Surgeon: Jonathon Bellows, MD;  Location: [facility] ENDOSCOPY;  Service: Gastroenterology;  Laterality: N/A;   LEFT HEART CATH AND CORS/GRAFTS ANGIOGRAPHY N/A 08/07/2017   Procedure: LEFT HEART CATH AND CORS/GRAFTS ANGIOGRAPHY;  Surgeon: Yolonda Kida, MD;  Location: [place] CV LAB;  Service: Cardiovascular;  Laterality: N/A;   LEFT HEART CATH AND CORS/GRAFTS ANGIOGRAPHY N/A 11/28/2019   Procedure: LEFT HEART CATH AND CORS/GRAFTS ANGIOGRAPHY;  Surgeon: Teodoro Spray, MD;  Location: [place] CV LAB;  Service: Cardiovascular;  Laterality: N/A;   PROSTATE SURGERY     SHOULDER ARTHROSCOPY       Home Meds: Prior to Admission medications   Medication Sig Start Date End Date Taking? Authorizing Provider  amLODipine (NORVASC) 10 MG tablet Take 10 mg by mouth daily. 12/26/19  Yes [provider]  aspirin 81 MG chewable tablet Chew 1 tablet (81 mg total) by mouth daily. 12/08/15  Yes Vaughan Basta, MD  atorvastatin (LIPITOR) 80 MG tablet Take 80 mg by mouth daily.    Yes [provider]  clopidogrel (PLAVIX) 75 MG tablet Take 1 tablet (75 mg total) by mouth daily. 11/29/19  Yes Avie Arenas,  PA-C  FARXIGA 10 MG TABS tablet Take 10 mg by mouth daily. 02/05/21  Yes [provider]  lisinopril (PRINIVIL,ZESTRIL) 10 MG tablet Take 1 tablet (10 mg total) by mouth daily. 12/08/15  Yes Vaughan Basta, MD  metFORMIN (GLUCOPHAGE-XR) 500 MG 24 hr tablet Take  1,000 mg by mouth daily with breakfast.   Yes [provider]  metoprolol tartrate (LOPRESSOR) 25 MG tablet Take 1 tablet (25 mg total) by mouth 2 (two) times daily. 11/29/19  Yes Jennye Boroughs, MD  nitroGLYCERIN (NITROSTAT) 0.4 MG SL tablet Place 1 tablet (0.4 mg total) under the tongue every 5 (five) minutes as needed for chest pain. 01/10/21  Yes Paulette Blanch, MD  pantoprazole (PROTONIX) 40 MG tablet Take 1 tablet (40 mg total) by mouth daily. 01/18/19 04/13/21 Yes Lucilla Lame, MD  blood glucose meter kit and supplies Dispense based on patient and insurance preference. Use up to four times daily as directed. (FOR ICD-10 E10.9, E11.9). 11/29/19   Jennye Boroughs, MD  insulin glargine (LANTUS) 100 UNIT/ML Solostar Pen Inject 15 Units into the skin at bedtime. Patient not taking: Reported on 04/13/2021 11/29/19   Jennye Boroughs, MD  Insulin Pen Needle 32G X 4 MM MISC 1 application by Does not apply route at bedtime. 11/29/19   Jennye Boroughs, MD  VICTOZA 18 MG/3ML SOPN Inject into the skin. Patient not taking: Reported on 04/13/2021 12/26/19   [provider]    Inpatient Medications:   aspirin  81 mg Oral Daily   atorvastatin  80 mg Oral Daily   clopidogrel  75 mg Oral Daily   insulin aspart  0-20 Units Subcutaneous TID WC   insulin aspart  0-5 Units Subcutaneous QHS   insulin aspart  3 Units Subcutaneous TID WC   insulin glargine-yfgn  20 Units Subcutaneous QHS   metoprolol tartrate  25 mg Oral BID    heparin 1,300 Units/hr (04/13/21 1017)    Allergies: No Known Allergies  Social History   Socioeconomic History   Marital status: Single    Spouse name: Not on file   Number of children: Not on file   Years of education: Not on file   Highest education level: Not on file  Occupational History   Not on file  Tobacco Use   Smoking status: Never   Smokeless tobacco: Current    Types: Chew  Vaping Use   Vaping Use: Never used  Substance and Sexual Activity   Alcohol  use: No   Drug use: Not Currently   Sexual activity: Not on file  Other Topics Concern   Not on file  Social History Narrative   Not on file   Social Determinants of Health   Financial Resource Strain: Not on file  Food Insecurity: Not on file  Transportation Needs: Not on file  Physical Activity: Not on file  Stress: Not on file  Social Connections: Not on file  Intimate Partner Violence: Not on file     History reviewed. No pertinent family history.   Review of Systems Positive for chest pain Negative for: General:  chills, fever, night sweats or weight changes.  Cardiovascular: PND orthopnea syncope dizziness  Dermatological skin lesions rashes Respiratory: Cough congestion Urologic: Frequent urination urination at night and hematuria Abdominal: negative for nausea, vomiting, diarrhea, bright red blood per rectum, melena, or hematemesis Neurologic: negative for visual changes, and/or hearing changes  All other systems reviewed and are otherwise negative except as noted above.  Labs: No results for  input(s): CKTOTAL, CKMB, TROPONINI in the last 72 hours. Lab Results  Component Value Date   WBC 8.6 04/12/2021   HGB 11.6 (L) 04/12/2021   HCT 34.8 (L) 04/12/2021   MCV 89.0 04/12/2021   PLT 317 04/12/2021    Recent Labs  Lab 04/13/21 0730  NA 132*  K 4.4  CL 102  CO2 22  BUN 25*  CREATININE 1.52*  CALCIUM 9.3  GLUCOSE 289*   Lab Results  Component Value Date   CHOL 319 (H) 04/13/2021   HDL 37 (L) 04/13/2021   LDLCALC 218 (H) 04/13/2021   TRIG 320 (H) 04/13/2021   No results found for: DDIMER  Radiology/Studies:  DG Chest 2 View  Result Date: 04/12/2021 CLINICAL DATA:  Chest pain EXAM: CHEST - 2 VIEW COMPARISON:  Chest x-ray 01/10/2021, CT chest 03/26/2020 FINDINGS: The heart and mediastinal contours are unchanged. No focal consolidation. No pulmonary edema. No pleural effusion. No pneumothorax. No acute osseous abnormality. Right shoulder rotator cuff  anchor suture. IMPRESSION: No active cardiopulmonary disease. Electronically Signed   By: Iven Finn M.D.   On: 04/12/2021 17:46   ECHOCARDIOGRAM COMPLETE  Result Date: 04/13/2021    ECHOCARDIOGRAM REPORT   Patient Name:   THELMER LEGLER Date of Exam: 04/13/2021 Medical Rec #:  [id number]        Height:       68.0 in Accession #:    [id number]       Weight:       260.0 lb Date of Birth:  [birth year]        BSA:          2.285 m Patient Age:    24 years         BP:           132/88 mmHg Patient Gender: M                HR:           54 bpm. Exam Location:  [facility] Procedure: 2D Echo, Cardiac Doppler and Color Doppler Indications:     NSTEMI I21.4  History:         Patient has prior history of Echocardiogram examinations. Risk                  Factors:Hypertension and Diabetes.  Sonographer:     Alyse Low Roar Referring Phys:  [id number] Loletha Grayer Diagnosing Phys: Serafina Royals MD IMPRESSIONS  1. Left ventricular ejection fraction, by estimation, is 45 to 50%. The left ventricle has mildly decreased function. The left ventricle demonstrates regional wall motion abnormalities (see scoring diagram/findings for description). Left ventricular diastolic parameters were normal.  2. Right ventricular systolic function is normal. The right ventricular size is normal.  3. The mitral valve is normal in structure. Trivial mitral valve regurgitation.  4. The aortic valve is normal in structure. Aortic valve regurgitation is not visualized. FINDINGS  Left Ventricle: Left ventricular ejection fraction, by estimation, is 45 to 50%. The left ventricle has mildly decreased function. The left ventricle demonstrates regional wall motion abnormalities. Mild hypokinesis of the left ventricular, mid inferoseptal wall and inferior wall. Definity contrast agent was given IV to delineate the left ventricular endocardial borders. The left ventricular internal cavity size was normal in size. There is no left ventricular hypertrophy.  Left ventricular diastolic parameters were normal. Right Ventricle: The right ventricular size is normal. No increase in right ventricular wall thickness. Right ventricular systolic function is normal. Left Atrium:  Left atrial size was normal in size. Right Atrium: Right atrial size was normal in size. Pericardium: There is no evidence of pericardial effusion. Mitral Valve: The mitral valve is normal in structure. Trivial mitral valve regurgitation. Tricuspid Valve: The tricuspid valve is normal in structure. Tricuspid valve regurgitation is mild. Aortic Valve: The aortic valve is normal in structure. Aortic valve regurgitation is not visualized. Aortic valve peak gradient measures 7.4 mmHg. Pulmonic Valve: The pulmonic valve was normal in structure. Pulmonic valve regurgitation is not visualized. Aorta: The aortic root and ascending aorta are structurally normal, with no evidence of dilitation. IAS/Shunts: No atrial level shunt detected by color flow Doppler.  LEFT VENTRICLE PLAX 2D LVIDd:         4.60 cm   Diastology LVIDs:         3.50 cm   LV e' medial:    6.20 cm/s LV PW:         1.00 cm   LV E/e' medial:  8.5 LV IVS:        1.20 cm   LV e' lateral:   10.00 cm/s LVOT diam:     2.20 cm   LV E/e' lateral: 5.3 LVOT Area:     3.80 cm  RIGHT VENTRICLE RV Basal diam:  4.30 cm RV Mid diam:    3.60 cm RV S prime:     8.81 cm/s TAPSE (M-mode): 1.6 cm LEFT ATRIUM             Index        RIGHT ATRIUM           Index LA diam:        3.90 cm 1.71 cm/m   RA Area:     16.70 cm LA Vol (A2C):   43.4 ml 18.99 ml/m  RA Volume:   43.60 ml  19.08 ml/m LA Vol (A4C):   54.5 ml 23.85 ml/m LA Biplane Vol: 48.6 ml 21.27 ml/m  AORTIC VALVE                 PULMONIC VALVE AV Area (Vmax): 2.07 cm     PV Vmax:        0.94 m/s AV Vmax:        136.00 cm/s  PV Peak grad:   3.5 mmHg AV Peak Grad:   7.4 mmHg     RVOT Peak grad: 1 mmHg LVOT Vmax:      74.10 cm/s  AORTA Ao Root diam: 3.70 cm MITRAL VALVE MV Area (PHT): 3.12 cm    SHUNTS  MV Decel Time: 243 msec    Systemic Diam: 2.20 cm MV E velocity: 52.70 cm/s MV A velocity: 64.30 cm/s MV E/A ratio:  0.82 MV A Prime:    12.0 cm/s Serafina Royals MD Electronically signed by Serafina Royals MD Signature Date/Time: 04/13/2021/1:12:36 PM    Final     EKG: Normal sinus rhythm otherwise normal EKG  Weights: Filed Weights   04/12/21 1717  Weight: 117.9 kg     Physical Exam: Blood pressure (!) 103/55, pulse (!) 52, temperature 98.4 F (36.9 C), temperature source Oral, resp. rate 17, height _0  (1.727 m), weight 117.9 kg, SpO2 97 %. Body mass index is 39.53 kg/m. General: Well developed, well nourished, in no acute distress. Head eyes ears nose throat: Normocephalic, atraumatic, sclera non-icteric, no xanthomas, nares are without discharge. No apparent thyromegaly and/or mass  Lungs: Normal respiratory effort.  no wheezes, no rales, no rhonchi.  Heart:  RRR with normal S1 S2. no murmur gallop, no rub, PMI is normal size and placement, carotid upstroke normal without bruit, jugular venous pressure is normal Abdomen: Soft, non-tender, non-distended with normoactive bowel sounds. No hepatomegaly. No rebound/guarding. No obvious abdominal masses. Abdominal aorta is normal size without bruit Extremities: No edema. no cyanosis, no clubbing, no ulcers  Peripheral : 2+ bilateral upper extremity pulses, 2+ bilateral femoral pulses, 2+ bilateral dorsal pedal pulse Neuro: Alert and oriented. No facial asymmetry. No focal deficit. Moves all extremities spontaneously. Musculoskeletal: Normal muscle tone without kyphosis Psych:  Responds to questions appropriately with a normal affect.    Assessment: 75 year old male with known coronary disease as recorded bypass graft and non-ST elevation myocardial infarction in the past chronic kidney disease with acute exacerbation hypertension hyperlipidemia having atypical chest discomfort at this time with no current evidence of acute coronary syndrome  but some elevation of troponin nonspecific  Plan: 1.  Okay to continue heparin at this time until further evaluation and treatment options of possibility of acute coronary syndrome 2.  Continuation of medication management including nitrates beta-blocker aspirin statin as before. 3.  Follow-up for improvements of acute on chronic kidney dysfunction 4.  Begin ambulation and follow-up for above improvements and will further consideration of further intervention if necessary  Signed, Corey Skains M.D. [facility] 04/13/2021, 2:05 PM

## 2021-04-13 NOTE — Progress Notes (Signed)
ANTICOAGULATION CONSULT NOTE   Pharmacy Consult for Heparin  Indication: chest pain/ACS  No Known Allergies  Patient Measurements: Height: 5\' 8"  (172.7 cm) Weight: 117.9 kg (260 lb) IBW/kg (Calculated) : 68.4 Heparin Dosing Weight: 95.2 kg   Vital Signs: BP: 127/74 (11/12 0900) Pulse Rate: 50 (11/12 0930)  Labs: Recent Labs    04/12/21 1716 04/12/21 2237 04/13/21 0134 04/13/21 0730 04/13/21 0940  HGB 11.6*  --   --   --   --   HCT 34.8*  --   --   --   --   PLT 317  --   --   --   --   APTT  --   --  30  --   --   LABPROT  --   --  13.4  --   --   INR  --   --  1.0  --   --   HEPARINUNFRC  --   --   --   --  0.35  CREATININE 1.78*  --   --  1.52*  --   TROPONINIHS 45* 59*  --  48*  --      Estimated Creatinine Clearance: 53.2 mL/min (A) (by C-G formula based on SCr of 1.52 mg/dL (H)).   Medical History: Past Medical History:  Diagnosis Date   CAD (coronary artery disease)    CKD (chronic kidney disease)    Diabetes mellitus without complication (HCC)    GERD (gastroesophageal reflux disease)    Hyperlipemia    Hypertension     Medications:   Assessment: Pharmacy consulted to dose heparin in this 75 year old male admitted with ACS/NSTEMI.  No prior anticoag noted.  CrCl = 45.4 ml/min   11/12 0940 HL=0.35  Therapeutic x1   Goal of Therapy:  Heparin level 0.3-0.7 units/ml Monitor platelets by anticoagulation protocol: Yes   Plan:  Give 4000 units bolus x 1 Start heparin infusion at 1300 units/hr Check anti-Xa level in 8 hours and daily while on heparin Continue to monitor H&H and platelets  11/12 0940 HL= 0.35.  Therapeutic x1  Will continue current rate and check confirmatory HL in 8 hrs   Saahil Herbster A 04/13/2021,11:24 AM

## 2021-04-13 NOTE — Progress Notes (Signed)
*  PRELIMINARY RESULTS* Echocardiogram 2D Echocardiogram has been performed.  Justin Keith 04/13/2021, 12:27 PM

## 2021-04-13 NOTE — ED Notes (Signed)
Pt report only worries is his blood suger

## 2021-04-13 NOTE — ED Notes (Signed)
Pt sleeping NADN 

## 2021-04-13 NOTE — Progress Notes (Signed)
ANTICOAGULATION CONSULT NOTE   Pharmacy Consult for Heparin  Indication: chest pain/ACS  No Known Allergies  Patient Measurements: Height: 5\' 8"  (172.7 cm) Weight: 117.9 kg (260 lb) IBW/kg (Calculated) : 68.4 Heparin Dosing Weight: 95.2 kg   Vital Signs: BP: 118/55 (11/12 1545) Pulse Rate: 55 (11/12 1606)  Labs: Recent Labs    04/12/21 1716 04/12/21 2237 04/13/21 0134 04/13/21 0730 04/13/21 0940 04/13/21 1734  HGB 11.6*  --   --   --   --  11.2*  HCT 34.8*  --   --   --   --  32.8*  PLT 317  --   --   --   --  304  APTT  --   --  30  --   --   --   LABPROT  --   --  13.4  --   --   --   INR  --   --  1.0  --   --   --   HEPARINUNFRC  --   --   --   --  0.35 0.33  CREATININE 1.78*  --   --  1.52*  --   --   TROPONINIHS 45* 59*  --  48*  --   --      Estimated Creatinine Clearance: 53.2 mL/min (A) (by C-G formula based on SCr of 1.52 mg/dL (H)).   Medical History: Past Medical History:  Diagnosis Date   CAD (coronary artery disease)    CKD (chronic kidney disease)    Diabetes mellitus without complication (HCC)    GERD (gastroesophageal reflux disease)    Hyperlipemia    Hypertension     Medications:   Assessment: Pharmacy consulted to dose heparin in this 74 year old male admitted with ACS/NSTEMI.  No prior anticoag noted. CrCl = 45.4 ml/min   Date/time HL Comment 11/12 0940 0.35 Therapeutic x1 11/12 1734 0.33 Therapeutic x2 (1300 units/hr)   Goal of Therapy:  Heparin level 0.3-0.7 units/ml Monitor platelets by anticoagulation protocol: Yes   Plan:  HL therapeutic x 2, will continue heparin infusion at 1300 units/hr. Hgb and plt stable when compared to baseline Will order next HL with am labs and continue to monitor CBC daily  Kamarie Veno Rodriguez-Guzman PharmD, BCPS 04/13/2021 6:37 PM

## 2021-04-13 NOTE — ED Provider Notes (Signed)
[facility] Emergency Department Provider Note  ____________________________________________   Event Date/Time   First MD Initiated Contact with Patient 04/13/21 0002     (approximate)  I have reviewed the triage vital signs and the nursing notes.   HISTORY  Chief Complaint Chest Pain and Hyperglycemia    HPI Justin Keith is a 75 y.o. male with medical history as listed below which notably includes substantial coronary artery disease status post prior NSTEMI and CABG.  Dr. Clayborn Bigness is his cardiologist.  He last had a catheterization (by Dr. Ubaldo Glassing) about a year and a half ago.  He does not usually have chest pain but he presents tonight for a couple of days of intermittent chest heaviness that can be severe at times.  Nothing in particular makes it better or worse including exertion.  No shortness of breath, radiation down arms or up into neck, no nausea/vomiting.  He denies fever, abdominal pain, cough.  He takes a daily baby aspirin and he thinks another medicine (Plavix).  He has been compliant with his regular medications as well though he says his blood sugar has been higher than usual.  He is eating and drinking okay.       Past Medical History:  Diagnosis Date   CAD (coronary artery disease)    CKD (chronic kidney disease)    Diabetes mellitus without complication (HCC)    GERD (gastroesophageal reflux disease)    Hyperlipemia    Hypertension     Patient Active Problem List   Diagnosis Date Noted   Hyperglycemia due to type 2 diabetes mellitus ([place]) 04/13/2021   Stage 3b chronic kidney disease ([place]) 04/13/2021   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   Acute non-ST elevation myocardial infarction (NSTEMI) ([place]) 11/26/2019   NSTEMI (non-ST elevated myocardial infarction) ([place]) 11/25/2019   AKI (acute kidney injury) ([place]) 11/25/2019   Chest pain with high risk of acute coronary syndrome 03/15/2019   Problems with swallowing and mastication    Chest pain  in adult 10/22/2018   HTN (hypertension) 09/15/2018   HLD (hyperlipidemia) 09/15/2018   GERD (gastroesophageal reflux disease) 09/15/2018   CAD (coronary artery disease) 09/15/2018   Uncontrolled secondary diabetes mellitus with stage 3 CKD (GFR 30-59) 09/15/2018   Chest pain 12/07/2015    Past Surgical History:  Procedure Laterality Date   CARDIAC CATHETERIZATION     CARDIAC SURGERY     COLONOSCOPY WITH PROPOFOL N/A 02/25/2018   Procedure: COLONOSCOPY WITH PROPOFOL;  Surgeon: Jonathon Bellows, MD;  Location: [facility] ENDOSCOPY;  Service: Gastroenterology;  Laterality: N/A;   COLONOSCOPY WITH PROPOFOL N/A 02/26/2018   Procedure: COLONOSCOPY WITH PROPOFOL;  Surgeon: Jonathon Bellows, MD;  Location[facility] ENDOSCOPY;  Service: Gastroenterology;  Laterality: N/A;   ESOPHAGOGASTRODUODENOSCOPY (EGD) WITH PROPOFOL N/A 01/18/2019   Procedure: ESOPHAGOGASTRODUODENOSCOPY (EGD) WITH PROPOFOL;  Surgeon: Lucilla Lame, MD;  Location: [facility] ENDOSCOPY;  Service: Endoscopy;  Laterality: N/A;   FOREIGN BODY REMOVAL N/A 03/07/2018   Procedure: FOREIGN BODY REMOVAL;  Surgeon: Jonathon Bellows, MD;  Location: [facility] ENDOSCOPY;  Service: Gastroenterology;  Laterality: N/A;   LEFT HEART CATH AND CORS/GRAFTS ANGIOGRAPHY N/A 08/07/2017   Procedure: LEFT HEART CATH AND CORS/GRAFTS ANGIOGRAPHY;  Surgeon: Yolonda Kida, MD;  Location: [place] CV LAB;  Service: Cardiovascular;  Laterality: N/A;   LEFT HEART CATH AND CORS/GRAFTS ANGIOGRAPHY N/A 11/28/2019   Procedure: LEFT HEART CATH AND CORS/GRAFTS ANGIOGRAPHY;  Surgeon: Teodoro Spray, MD;  Location: [place] CV LAB;  Service: Cardiovascular;  Laterality: N/A;  PROSTATE SURGERY     SHOULDER ARTHROSCOPY      Prior to Admission medications   Medication Sig Start Date End Date Taking? Authorizing Provider  amLODipine (NORVASC) 10 MG tablet Take 10 mg by mouth daily. 12/26/19   [provider]  aspirin 81 MG chewable tablet Chew 1 tablet (81 mg total) by mouth daily.  12/08/15   Vaughan Basta, MD  atorvastatin (LIPITOR) 80 MG tablet Take 80 mg by mouth daily.     [provider]  blood glucose meter kit and supplies Dispense based on patient and insurance preference. Use up to four times daily as directed. (FOR ICD-10 E10.9, E11.9). 11/29/19   Jennye Boroughs, MD  clopidogrel (PLAVIX) 75 MG tablet Take 1 tablet (75 mg total) by mouth daily. 11/29/19   Avie Arenas, PA-C  insulin glargine (LANTUS) 100 UNIT/ML Solostar Pen Inject 15 Units into the skin at bedtime. 11/29/19   Jennye Boroughs, MD  Insulin Pen Needle 32G X 4 MM MISC 1 application by Does not apply route at bedtime. 11/29/19   Jennye Boroughs, MD  lisinopril (PRINIVIL,ZESTRIL) 10 MG tablet Take 1 tablet (10 mg total) by mouth daily. 12/08/15   Vaughan Basta, MD  metFORMIN (GLUCOPHAGE-XR) 500 MG 24 hr tablet Take 1,000 mg by mouth daily with breakfast.     [provider]  metoprolol tartrate (LOPRESSOR) 25 MG tablet Take 1 tablet (25 mg total) by mouth 2 (two) times daily. 11/29/19   Jennye Boroughs, MD  nitroGLYCERIN (NITROSTAT) 0.4 MG SL tablet Place 1 tablet (0.4 mg total) under the tongue every 5 (five) minutes as needed for chest pain. 01/10/21   Paulette Blanch, MD  pantoprazole (PROTONIX) 40 MG tablet Take 1 tablet (40 mg total) by mouth daily. 01/18/19 01/18/20  Lucilla Lame, MD  VICTOZA 18 MG/3ML SOPN Inject into the skin. 12/26/19   [provider]    Allergies Patient has no known allergies.  No family history on file.  Social History Social History   Tobacco Use   Smoking status: Never   Smokeless tobacco: Current    Types: Chew  Vaping Use   Vaping Use: Never used  Substance Use Topics   Alcohol use: No   Drug use: Not Currently    Review of Systems Constitutional: No fever/chills.  Positive for elevated blood sugar. Eyes: No visual changes. ENT: No sore throat. Cardiovascular: Positive for chest pressure. Respiratory: Denies shortness of  breath. Gastrointestinal: No abdominal pain.  No nausea, no vomiting.  No diarrhea.  No constipation. Genitourinary: Negative for dysuria. Musculoskeletal: Negative for neck pain.  Negative for back pain. Integumentary: Negative for rash. Neurological: Negative for headaches, focal weakness or numbness.   ____________________________________________   PHYSICAL EXAM:  VITAL SIGNS: ED Triage Vitals  Enc Vitals Group     BP 04/12/21 1712 134/69     Pulse Rate 04/12/21 1712 (!) 58     Resp 04/12/21 1712 18     Temp 04/12/21 1712 98.4 F (36.9 C)     Temp Source 04/12/21 1712 Oral     SpO2 04/12/21 1712 96 %     Weight 04/12/21 1717 117.9 kg (260 lb)     Height 04/12/21 1717 1.727 m (5' 8")     Head Circumference --      Peak Flow --      Pain Score 04/12/21 1720 8     Pain Loc --      Pain Edu? --  Excl. in [place]? --     Constitutional: Alert and oriented.  Eyes: Conjunctivae are normal.  Head: Atraumatic. Nose: No congestion/rhinnorhea. Mouth/Throat: Patient is wearing a mask. Neck: No stridor.  No meningeal signs.   Cardiovascular: Mild bradycardia, regular rhythm. Good peripheral circulation. Respiratory: Normal respiratory effort.  No retractions. Gastrointestinal: Soft and nontender. No distention.  Musculoskeletal: No gross deformities of the extremities.  No tenderness to palpation of the chest wall. Neurologic:  Normal speech and language. No gross focal neurologic deficits are appreciated.  Skin:  Skin is warm, dry and intact. Psychiatric: Mood and affect are normal. Speech and behavior are normal.  ____________________________________________   LABS (all labs ordered are listed, but only abnormal results are displayed)  Labs Reviewed  BASIC METABOLIC PANEL - Abnormal; Notable for the following components:      Result Value   Sodium 129 (*)    Glucose, Bld 367 (*)    BUN 27 (*)    Creatinine, Ser 1.78 (*)    GFR, Estimated 40 (*)    All other  components within normal limits  CBC - Abnormal; Notable for the following components:   RBC 3.91 (*)    Hemoglobin 11.6 (*)    HCT 34.8 (*)    All other components within normal limits  CBG MONITORING, ED - Abnormal; Notable for the following components:   Glucose-Capillary 353 (*)    All other components within normal limits  CBG MONITORING, ED - Abnormal; Notable for the following components:   Glucose-Capillary 294 (*)    All other components within normal limits  TROPONIN I (HIGH SENSITIVITY) - Abnormal; Notable for the following components:   Troponin I (High Sensitivity) 45 (*)    All other components within normal limits  TROPONIN I (HIGH SENSITIVITY) - Abnormal; Notable for the following components:   Troponin I (High Sensitivity) 59 (*)    All other components within normal limits  RESP PANEL BY RT-PCR (FLU A&B, COVID) ARPGX2  PROTIME-INR  APTT   ____________________________________________  EKG  ED ECG REPORT I, Hinda Kehr, the attending physician, personally viewed and interpreted this ECG.  Date: 04/12/2021 EKG Time: 17:07 Rate: 62 Rhythm: normal sinus rhythm QRS Axis: normal Intervals: normal ST/T Wave abnormalities: normal Narrative Interpretation: no evidence of acute ischemia  ____________________________________________  RADIOLOGY I, Hinda Kehr, personally viewed and evaluated these images (plain radiographs) as part of my medical decision making, as well as reviewing the written report by the radiologist.  ED MD interpretation: No acute abnormality on chest x-ray  Official radiology report(s): DG Chest 2 View  Result Date: 04/12/2021 CLINICAL DATA:  Chest pain EXAM: CHEST - 2 VIEW COMPARISON:  Chest x-ray 01/10/2021, CT chest 03/26/2020 FINDINGS: The heart and mediastinal contours are unchanged. No focal consolidation. No pulmonary edema. No pleural effusion. No pneumothorax. No acute osseous abnormality. Right shoulder rotator cuff anchor  suture. IMPRESSION: No active cardiopulmonary disease. Electronically Signed   By: Iven Finn M.D.   On: 04/12/2021 17:46    ____________________________________________   PROCEDURES   Procedure(s) performed (including Critical Care):  .1-3 Lead EKG Interpretation Performed by: Hinda Kehr, MD Authorized by: Hinda Kehr, MD     Interpretation: abnormal     ECG rate:  52   ECG rate assessment: bradycardic     Rhythm: sinus bradycardia     Ectopy: none     Conduction: normal   .Critical Care Performed by: Hinda Kehr, MD Authorized by: Hinda Kehr, MD   Critical care  provider statement:    Critical care time (minutes):  30   Critical care time was exclusive of:  Separately billable procedures and treating other patients   Critical care was necessary to treat or prevent imminent or life-threatening deterioration of the following conditions:  Cardiac failure (NSTEMI vs unstable angina requiring heparin IV)   Critical care was time spent personally by me on the following activities:  Development of treatment plan with patient or surrogate, evaluation of patient's response to treatment, examination of patient, obtaining history from patient or surrogate, ordering and performing treatments and interventions, ordering and review of laboratory studies, ordering and review of radiographic studies, pulse oximetry, re-evaluation of patient's condition and review of old charts   ____________________________________________   INITIAL IMPRESSION / MDM / [place] / ED COURSE  As part of my medical decision making, I reviewed the following data within the [place] notes reviewed and incorporated, Labs reviewed , EKG interpreted , Old chart reviewed, Radiograph reviewed , Discussed with admitting physician , and Notes from prior ED visits   Differential diagnosis includes, but is not limited to, ACS including NSTEMI and unstable angina,  pneumonia, pneumothorax, musculoskeletal pain.  The patient is on the cardiac monitor to evaluate for evidence of arrhythmia and/or significant heart rate changes.  Vital signs are stable other than some mild bradycardia which I think is noncontributory.  Patient is reporting no chest pain at this time.  Hyperglycemia but not substantially so, with a normal anion gap.  Basic metabolic panel shows an elevated but stable creatinine of 1.78, just slightly above his baseline seems to be between 1.6 and 1.7.  CBC is essentially normal.  Of note, high-sensitivity troponin is elevated and went from 45 to 59.  EKG shows no sign of ischemia.  I personally reviewed the patient's imaging and agree with the radiologist's interpretation that there are no acute abnormalities on the chest x-ray.  I reviewed the patient's medical record, and most notably he had a cardiac catheterization about a year and a half ago that showed extensive coronary artery disease and stenoses of multiple vessels.  As per the cardiologist, the decision was made to manage medically with dual antiplatelet therapy.  Of the chest pain he is having is new and worrisome in the setting of his known coronary artery disease as well as a troponin that is elevated and continuing to go up slowly.  He is pain-free at this time.  I am treating him as unstable angina/NSTEMI with heparin bolus plus infusion and a full dose aspirin.  I discussed the case by phone with Dr. Damita Dunnings with the hospitalist service who will admit.         ____________________________________________  FINAL CLINICAL IMPRESSION(S) / ED DIAGNOSES  Final diagnoses:  Chest pain, unspecified type  Elevated troponin level  Unstable angina ([place])  Chronic kidney disease, unspecified CKD stage  Hyperglycemia due to diabetes mellitus ([place])     MEDICATIONS GIVEN DURING THIS VISIT:  Medications  aspirin chewable tablet 324 mg (has no administration in time range)  sodium  chloride 0.9 % bolus 500 mL (has no administration in time range)  heparin bolus via infusion 4,000 Units (has no administration in time range)  heparin ADULT infusion 100 units/mL (25000 units/268m) (has no administration in time range)     ED Discharge Orders     None        Note:  This document was prepared using Dragon voice recognition software  and may include unintentional dictation errors.   Hinda Kehr, MD 04/13/21 [phone]

## 2021-04-13 NOTE — Progress Notes (Signed)
ANTICOAGULATION CONSULT NOTE - Initial Consult  Pharmacy Consult for Heparin  Indication: chest pain/ACS  No Known Allergies  Patient Measurements: Height: 5\' 8"  (172.7 cm) Weight: 117.9 kg (260 lb) IBW/kg (Calculated) : 68.4 Heparin Dosing Weight: 95.2 kg   Vital Signs: Temp: 98.4 F (36.9 C) (11/11 1712) Temp Source: Oral (11/11 1712) BP: 132/58 (11/11 2355) Pulse Rate: 54 (11/11 2355)  Labs: Recent Labs    04/12/21 1716 04/12/21 2237  HGB 11.6*  --   HCT 34.8*  --   PLT 317  --   CREATININE 1.78*  --   TROPONINIHS 45* 59*    Estimated Creatinine Clearance: 45.4 mL/min (A) (by C-G formula based on SCr of 1.78 mg/dL (H)).   Medical History: Past Medical History:  Diagnosis Date   CAD (coronary artery disease)    CKD (chronic kidney disease)    Diabetes mellitus without complication (HCC)    GERD (gastroesophageal reflux disease)    Hyperlipemia    Hypertension     Medications:   Assessment: Pharmacy consulted to dose heparin in this 75 year old male admitted with ACS/NSTEMI.  No prior anticoag noted.  CrCl = 45.4 ml/min   Goal of Therapy:  Heparin level 0.3-0.7 units/ml Monitor platelets by anticoagulation protocol: Yes   Plan:  Give 4000 units bolus x 1 Start heparin infusion at 1300 units/hr Check anti-Xa level in 8 hours and daily while on heparin Continue to monitor H&H and platelets  Eufelia Veno D 04/13/2021,1:23 AM

## 2021-04-13 NOTE — Progress Notes (Signed)
Patient ID: Justin Keith, male   DOB: 1946/02/09, 75 y.o.   MRN: [id number] [facility] PROGRESS NOTE  ROARK RUFO [id number] DOB: 13-Oct-1945 DOA: 04/12/2021 PCP: [facility] [place]  HPI/Subjective: Patient coming in with repeated chest pain.  Chest pain lasting 30 to 40 minutes then went away and then came back and went away again.  No shortness of breath, no diaphoresis, no nausea or vomiting.  Chest pain described as a hard pain in the center of his chest.  He also states that his sugars have been up.  He takes metformin.  Unclear if he is taking the insulin at home.  Objective: Vitals:   04/13/21 1602 04/13/21 1606  BP:    Pulse: (!) 55 (!) 55  Resp: (!) 22 (!) 21  Temp:    SpO2: 95% 95%    Intake/Output Summary (Last 24 hours) at 04/13/2021 1625 Last data filed at 04/13/2021 1047 Gross per 24 hour  Intake 500 ml  Output 350 ml  Net 150 ml   Filed Weights   04/12/21 1717  Weight: 117.9 kg    ROS: Review of Systems  Respiratory:  Negative for cough and shortness of breath.   Cardiovascular:  Positive for chest pain.  Gastrointestinal:  Negative for abdominal pain, nausea and vomiting.  Exam: Physical Exam HENT:     Head: Normocephalic.     Mouth/Throat:     Pharynx: No oropharyngeal exudate.  Eyes:     General: Lids are normal.     Conjunctiva/sclera: Conjunctivae normal.  Cardiovascular:     Rate and Rhythm: Normal rate and regular rhythm.     Heart sounds: Normal heart sounds, S1 normal and S2 normal.  Pulmonary:     Breath sounds: No decreased breath sounds, wheezing, rhonchi or rales.  Abdominal:     Palpations: Abdomen is soft.     Tenderness: There is no abdominal tenderness.  Musculoskeletal:     Right lower leg: Swelling present.     Left lower leg: Swelling present.  Skin:    General: Skin is warm.     Findings: No rash.  Neurological:     Mental Status: He is alert.     Comments: Answers some yes/no questions  appropriately      Scheduled Meds:  aspirin  81 mg Oral Daily   atorvastatin  80 mg Oral Daily   clopidogrel  75 mg Oral Daily   insulin aspart  0-20 Units Subcutaneous TID WC   insulin aspart  0-5 Units Subcutaneous QHS   insulin aspart  3 Units Subcutaneous TID WC   insulin glargine-yfgn  20 Units Subcutaneous QHS   isosorbide mononitrate  30 mg Oral Daily   metoprolol tartrate  25 mg Oral BID   Continuous Infusions:  heparin 1,300 Units/hr (04/13/21 1017)    Assessment/Plan:  Chest pain with slightly elevated troponin but flat trend.  [place] cardiology consultation.  Continue Imdur, aspirin, Plavix, metoprolol and atorvastatin.  Patient on heparin drip but less likely cardiac in nature. Type 2 diabetes mellitus with hyper lipidemia and hyperglycemia.  Add glargine insulin 20 units at night and 3 units of short acting insulin prior to meals.  Patient will need insulin at home. Chronic kidney disease stage III.  With stage IIIb on presentation and now down to stage IIIa. Hyponatremia improved from 129 up to 132. 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  Will hopefully be able to add back lisinopril depending on blood pressure.  On metoprolol. Essential hypertension on metoprolol.  Hopefully will be able to add back lisinopril        Code Status:     Code Status Orders  (From admission, onward)           Start     Ordered   04/13/21 0150  Full code  Continuous        04/13/21 0154           Code Status History     Date Active Date Inactive Code Status Order ID Comments User Context   11/25/2019 1652 11/29/2019 1647 Full Code [id number]  Collier Bullock, MD ED   03/15/2019 0237 03/16/2019 1501 Full Code [id number]  Lang Snow, NP ED   10/22/2018 0424 10/22/2018 1808 Full Code [id number]  Mayer Camel, NP ED   09/15/2018 0220 09/15/2018 1744 Full Code [id number]  Lance Coon, MD ED   08/07/2017 1309 08/07/2017 1734 Full Code [id number]  Yolonda Kida, MD Inpatient   12/07/2015 1430 12/08/2015 1510 Full Code [id number]  Epifanio Lesches, MD ED      Family Communication: Left message for patient's sister Disposition Plan: Status is: Inpatient  Consultants: Cardiology  Time spent: 32 minutes   [place]

## 2021-04-13 NOTE — ED Notes (Signed)
Echocardiogram being performed at bedside.

## 2021-04-14 ENCOUNTER — Encounter: Payer: Self-pay | Admitting: Internal Medicine

## 2021-04-14 DIAGNOSIS — I200 Unstable angina: Secondary | ICD-10-CM

## 2021-04-14 DIAGNOSIS — N1832 Chronic kidney disease, stage 3b: Secondary | ICD-10-CM

## 2021-04-14 LAB — BASIC METABOLIC PANEL
Anion gap: 7 (ref 5–15)
BUN: 26 mg/dL — ABNORMAL HIGH (ref 8–23)
CO2: 22 mmol/L (ref 22–32)
Calcium: 9.3 mg/dL (ref 8.9–10.3)
Chloride: 103 mmol/L (ref 98–111)
Creatinine, Ser: 1.47 mg/dL — ABNORMAL HIGH (ref 0.61–1.24)
GFR, Estimated: 50 mL/min — ABNORMAL LOW (ref >60)
Glucose, Bld: 244 mg/dL — ABNORMAL HIGH (ref 70–99)
Potassium: 4.5 mmol/L (ref 3.5–5.1)
Sodium: 132 mmol/L — ABNORMAL LOW (ref 135–145)

## 2021-04-14 LAB — CBC
HCT: 34.6 % — ABNORMAL LOW (ref 39.0–52.0)
Hemoglobin: 11.3 g/dL — ABNORMAL LOW (ref 13.0–17.0)
MCH: 29 pg (ref 26.0–34.0)
MCHC: 32.7 g/dL (ref 30.0–36.0)
MCV: 88.7 fL (ref 80.0–100.0)
Platelets: 311 10*3/uL (ref 150–400)
RBC: 3.9 MIL/uL — ABNORMAL LOW (ref 4.22–5.81)
RDW: 14.3 % (ref 11.5–15.5)
WBC: 9.1 10*3/uL (ref 4.0–10.5)
nRBC: 0 % (ref 0.0–0.2)

## 2021-04-14 LAB — CBG MONITORING, ED
Glucose-Capillary: 209 mg/dL — ABNORMAL HIGH (ref 70–99)
Glucose-Capillary: 252 mg/dL — ABNORMAL HIGH (ref 70–99)
Glucose-Capillary: 132 mg/dL — ABNORMAL HIGH (ref 70–99)

## 2021-04-14 LAB — GLUCOSE, CAPILLARY: Glucose-Capillary: 188 mg/dL — ABNORMAL HIGH (ref 70–99)

## 2021-04-14 LAB — HEPARIN LEVEL (UNFRACTIONATED): Heparin Unfractionated: 0.35 IU/mL (ref 0.30–0.70)

## 2021-04-14 MED ORDER — SODIUM CHLORIDE 0.9% FLUSH: 3.0000 mL | INTRAVENOUS | Status: DC | PRN

## 2021-04-14 MED ORDER — SODIUM CHLORIDE 0.9% FLUSH
3.0000 mL | Freq: Two times a day (BID) | INTRAVENOUS | Status: DC
  Administered 2021-04-14: 3 mL via INTRAVENOUS

## 2021-04-14 MED ORDER — SODIUM CHLORIDE 0.9 % WEIGHT BASED INFUSION
1.0000 mL/kg/h | INTRAVENOUS | Status: DC
  Administered 2021-04-15: 1 mL/kg/h via INTRAVENOUS

## 2021-04-14 MED ORDER — SODIUM CHLORIDE 0.9 % WEIGHT BASED INFUSION
3.0000 mL/kg/h | INTRAVENOUS | Status: AC
  Administered 2021-04-15: 3 mL/kg/h via INTRAVENOUS

## 2021-04-14 MED ORDER — ASPIRIN 81 MG PO CHEW
81.0000 mg | CHEWABLE_TABLET | ORAL | Status: AC
  Administered 2021-04-15: 81 mg via ORAL
  Filled 2021-04-14: qty 1

## 2021-04-14 MED ORDER — SODIUM CHLORIDE 0.9 % IV SOLN: INTRAVENOUS | Status: DC

## 2021-04-14 MED ORDER — SODIUM CHLORIDE 0.9 % IV SOLN: 250.0000 mL | INTRAVENOUS | Status: DC | PRN

## 2021-04-14 MED ORDER — LISINOPRIL 5 MG PO TABS
5.0000 mg | ORAL_TABLET | Freq: Every day | ORAL | Status: DC
  Administered 2021-04-14 – 2021-04-15 (×2): 5 mg via ORAL
  Filled 2021-04-14 (×2): qty 1

## 2021-04-14 NOTE — Progress Notes (Signed)
Patient ID: Justin Keith, male   DOB: [birth year], 75 y.o.   MRN: [id number] [facility] PROGRESS NOTE  KIANTE CIAVARELLA [id number] DOB: 09-24-1945 DOA: 04/12/2021 PCP: [facility] [place]  HPI/Subjective: Patient feeling better today.  No current chest pain.  No shortness of breath.  No diaphoresis or nausea or vomiting.  Cardiology has decided to do a cardiac catheterization tomorrow.  Objective: Vitals:   04/14/21 0900 04/14/21 1000  BP: 119/63   Pulse: (!) 55 98  Resp: 18 17  Temp:    SpO2: 96% 99%    Intake/Output Summary (Last 24 hours) at 04/14/2021 1202 Last data filed at 04/14/2021 0208 Gross per 24 hour  Intake --  Output 300 ml  Net -300 ml   Filed Weights   04/12/21 1717  Weight: 117.9 kg    ROS: Review of Systems  Respiratory:  Negative for shortness of breath.   Cardiovascular:  Negative for chest pain.  Gastrointestinal:  Negative for abdominal pain, nausea and vomiting.  Exam: Physical Exam HENT:     Head: Normocephalic.     Mouth/Throat:     Pharynx: No oropharyngeal exudate.  Eyes:     General: Lids are normal.     Conjunctiva/sclera: Conjunctivae normal.  Cardiovascular:     Rate and Rhythm: Normal rate and regular rhythm.     Heart sounds: Normal heart sounds, S1 normal and S2 normal.  Pulmonary:     Breath sounds: No decreased breath sounds, wheezing, rhonchi or rales.  Abdominal:     Palpations: Abdomen is soft.     Tenderness: There is no abdominal tenderness.  Musculoskeletal:     Right ankle: Swelling present.     Left ankle: Swelling present.  Skin:    General: Skin is warm.     Findings: No rash.  Neurological:     Mental Status: He is alert and oriented to person, place, and time.      Scheduled Meds:  aspirin  81 mg Oral Daily   atorvastatin  80 mg Oral Daily   clopidogrel  75 mg Oral Daily   insulin aspart  0-20 Units Subcutaneous TID WC   insulin aspart  0-5 Units Subcutaneous QHS    insulin aspart  3 Units Subcutaneous TID WC   insulin glargine-yfgn  20 Units Subcutaneous QHS   isosorbide mononitrate  30 mg Oral Daily   lisinopril  5 mg Oral Daily   metoprolol tartrate  25 mg Oral BID   sodium chloride flush  3 mL Intravenous Q12H   Continuous Infusions:  heparin 1,300 Units/hr (04/14/21 0814)    Assessment/Plan:  Unstable angina with chest pain and slightly elevated troponin but flat trend.  Cardiology has decided to do a cardiac catheterization tomorrow.  Continue heparin drip.  Patient on Imdur, aspirin, Plavix, metoprolol and atorvastatin.  LDL 218. Type 2 diabetes mellitus with hyperlipidemia and hyperglycemia.  I do not think he takes his insulin at home.  Continue glargine insulin 20 units at night and 3 units prior to meals plus sliding scale.  Holding Glucophage prior to cardiac cath and after cardiac cath.  Continue atorvastatin.  Hemoglobin A1c elevated 11.3. Chronic kidney disease stage IIIa.  GFR 50 with creatinine of 1.47 today.  Start IV fluids this evening prior to cardiac cath. Hyponatremia.  Sodium 132 today Cardiomyopathy with an EF of 45 to 50%.  Add back low-dose lisinopril Essential hypertension on metoprolol and lisinopril        Code  Status:     Code Status Orders  (From admission, onward)           Start     Ordered   04/13/21 0150  Full code  Continuous        04/13/21 0154           Code Status History     Date Active Date Inactive Code Status Order ID Comments User Context   11/25/2019 1652 11/29/2019 1647 Full Code [id number]  Collier Bullock, MD ED   03/15/2019 0237 03/16/2019 1501 Full Code [id number]  Lang Snow, NP ED   10/22/2018 0424 10/22/2018 1808 Full Code [id number]  Mayer Camel, NP ED   09/15/2018 0220 09/15/2018 1744 Full Code [id number]  Lance Coon, MD ED   08/07/2017 1309 08/07/2017 1734 Full Code [id number]  Yolonda Kida, MD Inpatient   12/07/2015 1430 12/08/2015 1510 Full Code [id number]   Epifanio Lesches, MD ED      Family Communication: Declined Disposition Plan: Status is: Inpatient.  Cardiology planning on cardiac cath on 04/15/2021  Consultants: Cardiology  Time spent: 27 minutes, case discussed with cardiology  [place]

## 2021-04-14 NOTE — Progress Notes (Signed)
[facility] Encounter Note  Patient: Justin Keith / Admit Date: 04/12/2021 / Date of Encounter: 04/14/2021, 11:49 AM   Subjective: Patient still with significant concerns of palpitations weakness fatigue as well as chest discomfort since admission.  There is no current evidence of myocardial infarction despite some minimal elevation of troponin.  No evidence of congestive heart failure with normal chest x-ray and exam.  Telemetry has shown normal sinus rhythm.  With concerns of worsening cardiovascular disease coronary bypass graft and obtuse marginal stenosis the patient wishes to further evaluate and treat for the possibility of coronary artery disease by cardiac catheterization  Review of Systems: Positive for: Shortness of breath chest pain Negative for: Vision change, hearing change, syncope, dizziness, nausea, vomiting,diarrhea, bloody stool, stomach pain, cough, congestion, diaphoresis, urinary frequency, urinary pain,skin lesions, skin rashes Others previously listed  Objective: Telemetry: Normal sinus rhythm Physical Exam: Blood pressure 119/63, pulse 98, temperature 98.4 F (36.9 C), temperature source Oral, resp. rate 17, height 5\' 8"  (1.727 m), weight 117.9 kg, SpO2 99 %. Body mass index is 39.53 kg/m. General: Well developed, well nourished, in no acute distress. Head: Normocephalic, atraumatic, sclera non-icteric, no xanthomas, nares are without discharge. Neck: No apparent masses Lungs: Normal respirations with no wheezes, no rhonchi, no rales , no crackles   Heart: Regular rate and rhythm, normal S1 S2, no murmur, no rub, no gallop, PMI is normal size and placement, carotid upstroke normal without bruit, jugular venous pressure normal Abdomen: Soft, non-tender, non-distended with normoactive bowel sounds. No hepatosplenomegaly. Abdominal aorta is normal size without bruit Extremities: No edema, no clubbing, no cyanosis, no ulcers,  Peripheral: 2+  radial, 2+ femoral, 2+ dorsal pedal pulses Neuro: Alert and oriented. Moves all extremities spontaneously. Psych:  Responds to questions appropriately with a normal affect.   Intake/Output Summary (Last 24 hours) at 04/14/2021 1149 Last data filed at 04/14/2021 0208 Gross per 24 hour  Intake --  Output 300 ml  Net -300 ml    Inpatient Medications:   aspirin  81 mg Oral Daily   atorvastatin  80 mg Oral Daily   clopidogrel  75 mg Oral Daily   insulin aspart  0-20 Units Subcutaneous TID WC   insulin aspart  0-5 Units Subcutaneous QHS   insulin aspart  3 Units Subcutaneous TID WC   insulin glargine-yfgn  20 Units Subcutaneous QHS   isosorbide mononitrate  30 mg Oral Daily   lisinopril  5 mg Oral Daily   metoprolol tartrate  25 mg Oral BID   Infusions:   heparin 1,300 Units/hr (04/14/21 0814)    Labs: Recent Labs    04/13/21 0730 04/14/21 0619  NA 132* 132*  K 4.4 4.5  CL 102 103  CO2 22 22  GLUCOSE 289* 244*  BUN 25* 26*  CREATININE 1.52* 1.47*  CALCIUM 9.3 9.3   No results for input(s): AST, ALT, ALKPHOS, BILITOT, PROT, ALBUMIN in the last 72 hours. Recent Labs    04/13/21 1734 04/14/21 0619  WBC 10.6* 9.1  HGB 11.2* 11.3*  HCT 32.8* 34.6*  MCV 88.9 88.7  PLT 304 311   No results for input(s): CKTOTAL, CKMB, TROPONINI in the last 72 hours. Invalid input(s): POCBNP Recent Labs    04/13/21 0636  HGBA1C 11.3*     Weights: Filed Weights   04/12/21 1717  Weight: 117.9 kg     Radiology/Studies:  DG Chest 2 View  Result Date: 04/12/2021 CLINICAL DATA:  Chest pain EXAM: CHEST - 2  VIEW COMPARISON:  Chest x-ray 01/10/2021, CT chest 03/26/2020 FINDINGS: The heart and mediastinal contours are unchanged. No focal consolidation. No pulmonary edema. No pleural effusion. No pneumothorax. No acute osseous abnormality. Right shoulder rotator cuff anchor suture. IMPRESSION: No active cardiopulmonary disease. Electronically Signed   By: Iven Finn M.D.   On:  04/12/2021 17:46   ECHOCARDIOGRAM COMPLETE  Result Date: 04/13/2021    ECHOCARDIOGRAM REPORT   Patient Name:   Justin Keith Date of Exam: 04/13/2021 Medical Rec #:  [id number]        Height:       68.0 in Accession #:    [id number]       Weight:       260.0 lb Date of Birth:  75 years        BSA:          2.285 m Patient Age:    75 years         BP:           132/88 mmHg Patient Gender: M                HR:           54 bpm. Exam Location:  [facility] Procedure: 2D Echo, Cardiac Doppler and Color Doppler Indications:     NSTEMI I21.4  History:         Patient has prior history of Echocardiogram examinations. Risk                  Factors:Hypertension and Diabetes.  Sonographer:     Alyse Low Roar Referring Phys:  [id number] Loletha Grayer Diagnosing Phys: Serafina Royals MD IMPRESSIONS  1. Left ventricular ejection fraction, by estimation, is 45 to 50%. The left ventricle has mildly decreased function. The left ventricle demonstrates regional wall motion abnormalities (see scoring diagram/findings for description). Left ventricular diastolic parameters were normal.  2. Right ventricular systolic function is normal. The right ventricular size is normal.  3. The mitral valve is normal in structure. Trivial mitral valve regurgitation.  4. The aortic valve is normal in structure. Aortic valve regurgitation is not visualized. FINDINGS  Left Ventricle: Left ventricular ejection fraction, by estimation, is 45 to 50%. The left ventricle has mildly decreased function. The left ventricle demonstrates regional wall motion abnormalities. Mild hypokinesis of the left ventricular, mid inferoseptal wall and inferior wall. Definity contrast agent was given IV to delineate the left ventricular endocardial borders. The left ventricular internal cavity size was normal in size. There is no left ventricular hypertrophy. Left ventricular diastolic parameters were normal. Right Ventricle: The right ventricular size is normal. No increase in  right ventricular wall thickness. Right ventricular systolic function is normal. Left Atrium: Left atrial size was normal in size. Right Atrium: Right atrial size was normal in size. Pericardium: There is no evidence of pericardial effusion. Mitral Valve: The mitral valve is normal in structure. Trivial mitral valve regurgitation. Tricuspid Valve: The tricuspid valve is normal in structure. Tricuspid valve regurgitation is mild. Aortic Valve: The aortic valve is normal in structure. Aortic valve regurgitation is not visualized. Aortic valve peak gradient measures 7.4 mmHg. Pulmonic Valve: The pulmonic valve was normal in structure. Pulmonic valve regurgitation is not visualized. Aorta: The aortic root and ascending aorta are structurally normal, with no evidence of dilitation. IAS/Shunts: No atrial level shunt detected by color flow Doppler.  LEFT VENTRICLE PLAX 2D LVIDd:         4.60 cm   Diastology LVIDs:  3.50 cm   LV e' medial:    6.20 cm/s LV PW:         1.00 cm   LV E/e' medial:  8.5 LV IVS:        1.20 cm   LV e' lateral:   10.00 cm/s LVOT diam:     2.20 cm   LV E/e' lateral: 5.3 LVOT Area:     3.80 cm  RIGHT VENTRICLE RV Basal diam:  4.30 cm RV Mid diam:    3.60 cm RV S prime:     8.81 cm/s TAPSE (M-mode): 1.6 cm LEFT ATRIUM             Index        RIGHT ATRIUM           Index LA diam:        3.90 cm 1.71 cm/m   RA Area:     16.70 cm LA Vol (A2C):   43.4 ml 18.99 ml/m  RA Volume:   43.60 ml  19.08 ml/m LA Vol (A4C):   54.5 ml 23.85 ml/m LA Biplane Vol: 48.6 ml 21.27 ml/m  AORTIC VALVE                 PULMONIC VALVE AV Area (Vmax): 2.07 cm     PV Vmax:        0.94 m/s AV Vmax:        136.00 cm/s  PV Peak grad:   3.5 mmHg AV Peak Grad:   7.4 mmHg     RVOT Peak grad: 1 mmHg LVOT Vmax:      74.10 cm/s  AORTA Ao Root diam: 3.70 cm MITRAL VALVE MV Area (PHT): 3.12 cm    SHUNTS MV Decel Time: 243 msec    Systemic Diam: 2.20 cm MV E velocity: 52.70 cm/s MV A velocity: 64.30 cm/s MV E/A ratio:  0.82  MV A Prime:    12.0 cm/s Serafina Royals MD Electronically signed by Serafina Royals MD Signature Date/Time: 04/13/2021/1:12:36 PM    Final      Assessment and Recommendation  75 y.o. male with known coronary disease status post coronary bypass graft chronic kidney disease stage III hypertension hyperlipidemia with continued intermittent chest discomfort needing further evaluation and treatment options 1.  Proceed to cardiac catheterization to assess coronary anatomy and further treatment thereof is necessary.  Patient understands risk and benefits of cardiac catheterization.  This includes a possibility of death stroke heart attack infection bleeding or blood clot.  He is at low risk for conscious sedation 2.  Continuation of continue medication management for further risk reduction in cardiomyopathy LV systolic dysfunction and coronary artery disease 3.  For further treatment options after above  Signed, Serafina Royals M.D. FACC

## 2021-04-14 NOTE — Progress Notes (Signed)
ANTICOAGULATION CONSULT NOTE   Pharmacy Consult for Heparin  Indication: chest pain/ACS  No Known Allergies  Patient Measurements: Height: 5\' 8"  (172.7 cm) Weight: 117.9 kg (260 lb) IBW/kg (Calculated) : 68.4 Heparin Dosing Weight: 95.2 kg   Vital Signs: BP: 130/70 (11/13 0730) Pulse Rate: 55 (11/13 0730)  Labs: Recent Labs    04/12/21 1716 04/12/21 2237 04/13/21 0134 04/13/21 0730 04/13/21 0940 04/13/21 1734 04/14/21 0619  HGB 11.6*  --   --   --   --  11.2* 11.3*  HCT 34.8*  --   --   --   --  32.8* 34.6*  PLT 317  --   --   --   --  304 311  APTT  --   --  30  --   --   --   --   LABPROT  --   --  13.4  --   --   --   --   INR  --   --  1.0  --   --   --   --   HEPARINUNFRC  --   --   --   --  0.35 0.33 0.35  CREATININE 1.78*  --   --  1.52*  --   --  1.47*  TROPONINIHS 45* 59*  --  48*  --   --   --      Estimated Creatinine Clearance: 55 mL/min (A) (by C-G formula based on SCr of 1.47 mg/dL (H)).   Medical History: Past Medical History:  Diagnosis Date   CAD (coronary artery disease)    CKD (chronic kidney disease)    Diabetes mellitus without complication (HCC)    GERD (gastroesophageal reflux disease)    Hyperlipemia    Hypertension     Medications:   Assessment: Pharmacy consulted to dose heparin in this 75 year old male admitted with ACS/NSTEMI.  No prior anticoag noted. CrCl = 45.4 ml/min   Date/time HL Comment 11/12 0940 0.35 Therapeutic x1 11/12 1734 0.33 Therapeutic x2 (1300 units/hr) 11/13 0619 0.35 Thera   Goal of Therapy:  Heparin level 0.3-0.7 units/ml Monitor platelets by anticoagulation protocol: Yes   Plan:  11/13 @0619  HL=0.35  thera HL therapeutic x 3, will continue heparin infusion at 1300 units/hr. Hgb and plt stable when compared to baseline Will order next HL with am labs and continue to monitor CBC daily  Chinita Greenland PharmD Clinical Pharmacist 04/14/2021

## 2021-04-15 ENCOUNTER — Encounter: Payer: Self-pay | Admitting: Internal Medicine

## 2021-04-15 ENCOUNTER — Encounter
Admission: EM | Disposition: A | Discharge status: Home / Self Care | Payer: Self-pay | Source: Home / Self Care | Attending: Internal Medicine

## 2021-04-15 DIAGNOSIS — I209 Angina pectoris, unspecified: Secondary | ICD-10-CM

## 2021-04-15 DIAGNOSIS — Z794 Long term (current) use of insulin: Secondary | ICD-10-CM

## 2021-04-15 HISTORY — PX: LEFT HEART CATH AND CORS/GRAFTS ANGIOGRAPHY: CATH118250

## 2021-04-15 LAB — BASIC METABOLIC PANEL
Anion gap: 7 (ref 5–15)
BUN: 23 mg/dL (ref 8–23)
CO2: 23 mmol/L (ref 22–32)
Calcium: 9.5 mg/dL (ref 8.9–10.3)
Chloride: 104 mmol/L (ref 98–111)
Creatinine, Ser: 1.54 mg/dL — ABNORMAL HIGH (ref 0.61–1.24)
GFR, Estimated: 47 mL/min — ABNORMAL LOW (ref >60)
Glucose, Bld: 205 mg/dL — ABNORMAL HIGH (ref 70–99)
Potassium: 4.5 mmol/L (ref 3.5–5.1)
Sodium: 134 mmol/L — ABNORMAL LOW (ref 135–145)

## 2021-04-15 LAB — CBC
HCT: 36 % — ABNORMAL LOW (ref 39.0–52.0)
Hemoglobin: 11.7 g/dL — ABNORMAL LOW (ref 13.0–17.0)
MCH: 29 pg (ref 26.0–34.0)
MCHC: 32.5 g/dL (ref 30.0–36.0)
MCV: 89.1 fL (ref 80.0–100.0)
Platelets: 331 10*3/uL (ref 150–400)
RBC: 4.04 MIL/uL — ABNORMAL LOW (ref 4.22–5.81)
RDW: 14.3 % (ref 11.5–15.5)
WBC: 9.8 10*3/uL (ref 4.0–10.5)
nRBC: 0 % (ref 0.0–0.2)

## 2021-04-15 LAB — HEPARIN LEVEL (UNFRACTIONATED): Heparin Unfractionated: 0.42 IU/mL (ref 0.30–0.70)

## 2021-04-15 LAB — GLUCOSE, CAPILLARY
Glucose-Capillary: 197 mg/dL — ABNORMAL HIGH (ref 70–99)
Glucose-Capillary: 205 mg/dL — ABNORMAL HIGH (ref 70–99)
Glucose-Capillary: 173 mg/dL — ABNORMAL HIGH (ref 70–99)
Glucose-Capillary: 200 mg/dL — ABNORMAL HIGH (ref 70–99)

## 2021-04-15 SURGERY — LEFT HEART CATH AND CORS/GRAFTS ANGIOGRAPHY
Anesthesia: Moderate Sedation

## 2021-04-15 MED ORDER — SODIUM CHLORIDE 0.9 % WEIGHT BASED INFUSION
1.0000 mL/kg/h | INTRAVENOUS | Status: DC
  Administered 2021-04-15: 1 mL/kg/h via INTRAVENOUS

## 2021-04-15 MED ORDER — LIDOCAINE HCL 1 % IJ SOLN
INTRAMUSCULAR | Status: AC
  Filled 2021-04-15: qty 20

## 2021-04-15 MED ORDER — INSULIN STARTER KIT- PEN NEEDLES (ENGLISH)
1.0000 | Freq: Once | Status: DC
  Filled 2021-04-15: qty 1

## 2021-04-15 MED ORDER — HEPARIN (PORCINE) IN NACL 1000-0.9 UT/500ML-% IV SOLN
INTRAVENOUS | Status: AC
  Filled 2021-04-15: qty 1000

## 2021-04-15 MED ORDER — AMLODIPINE BESYLATE 5 MG PO TABS: 5.0000 mg | ORAL_TABLET | Freq: Every day | ORAL | 0 refills | Status: DC

## 2021-04-15 MED ORDER — MIDAZOLAM HCL 2 MG/2ML IJ SOLN
INTRAMUSCULAR | Status: AC
  Filled 2021-04-15: qty 2

## 2021-04-15 MED ORDER — IOHEXOL 350 MG/ML SOLN
INTRAVENOUS | Status: DC | PRN
  Administered 2021-04-15: 93 mL

## 2021-04-15 MED ORDER — LIDOCAINE HCL (PF) 1 % IJ SOLN
INTRAMUSCULAR | Status: DC | PRN
  Administered 2021-04-15: 20 mL

## 2021-04-15 MED ORDER — LIVING WELL WITH DIABETES BOOK
Freq: Once | Status: DC
  Filled 2021-04-15: qty 1

## 2021-04-15 MED ORDER — HYDRALAZINE HCL 20 MG/ML IJ SOLN: 10.0000 mg | INTRAMUSCULAR | Status: DC | PRN

## 2021-04-15 MED ORDER — ACETAMINOPHEN 325 MG PO TABS: 650.0000 mg | ORAL_TABLET | ORAL | Status: DC | PRN

## 2021-04-15 MED ORDER — LISINOPRIL 5 MG PO TABS: 5.0000 mg | ORAL_TABLET | Freq: Every day | ORAL | 0 refills | Status: DC

## 2021-04-15 MED ORDER — AMLODIPINE BESYLATE 5 MG PO TABS
5.0000 mg | ORAL_TABLET | Freq: Every day | ORAL | Status: DC
  Administered 2021-04-15: 5 mg via ORAL
  Filled 2021-04-15: qty 1

## 2021-04-15 MED ORDER — INSULIN GLARGINE 100 UNIT/ML SOLOSTAR PEN: 20.0000 [IU] | PEN_INJECTOR | Freq: Every day | SUBCUTANEOUS | 0 refills | Status: AC

## 2021-04-15 MED ORDER — INSULIN STARTER KIT- PEN NEEDLES (ENGLISH): 1.0000 | Freq: Once | 0 refills | Status: AC

## 2021-04-15 MED ORDER — LABETALOL HCL 5 MG/ML IV SOLN: 10.0000 mg | INTRAVENOUS | Status: DC | PRN

## 2021-04-15 MED ORDER — ISOSORBIDE MONONITRATE ER 30 MG PO TB24
30.0000 mg | ORAL_TABLET | Freq: Every day | ORAL | 0 refills | Status: DC
Start: 2021-04-16 — End: 2021-10-29

## 2021-04-15 MED ORDER — FENTANYL CITRATE (PF) 100 MCG/2ML IJ SOLN
INTRAMUSCULAR | Status: AC
  Filled 2021-04-15: qty 2

## 2021-04-15 MED ORDER — HEPARIN (PORCINE) IN NACL 1000-0.9 UT/500ML-% IV SOLN
INTRAVENOUS | Status: DC | PRN
  Administered 2021-04-15: 1000 mL

## 2021-04-15 MED ORDER — ONDANSETRON HCL 4 MG/2ML IJ SOLN: 4.0000 mg | Freq: Four times a day (QID) | INTRAMUSCULAR | Status: DC | PRN

## 2021-04-15 MED ORDER — INSULIN ASPART 100 UNIT/ML FLEXPEN
PEN_INJECTOR | SUBCUTANEOUS | 11 refills | Status: DC
Start: 2021-04-15 — End: 2022-03-26

## 2021-04-15 MED ORDER — MIDAZOLAM HCL 2 MG/2ML IJ SOLN
INTRAMUSCULAR | Status: DC | PRN
  Administered 2021-04-15: 1 mg via INTRAVENOUS

## 2021-04-15 MED ORDER — FENTANYL CITRATE (PF) 100 MCG/2ML IJ SOLN
INTRAMUSCULAR | Status: DC | PRN
  Administered 2021-04-15: 50 ug via INTRAVENOUS

## 2021-04-15 SURGICAL SUPPLY — 11 items
CATH INFINITI 5 FR IM (CATHETERS) ×2 IMPLANT
CATH INFINITI 5FR JL4 (CATHETERS) ×2 IMPLANT
CATH INFINITI JR4 5F (CATHETERS) ×2 IMPLANT
DEVICE CLOSURE MYNXGRIP 5F (Vascular Products) ×2 IMPLANT
NEEDLE PERC 18GX7CM (NEEDLE) ×2 IMPLANT
PACK CARDIAC CATH (CUSTOM PROCEDURE TRAY) ×2 IMPLANT
PROTECTION STATION PRESSURIZED (MISCELLANEOUS) ×2
SET ATX SIMPLICITY (MISCELLANEOUS) ×2 IMPLANT
SHEATH AVANTI 5FR X 11CM (SHEATH) ×2 IMPLANT
STATION PROTECTION PRESSURIZED (MISCELLANEOUS) ×1 IMPLANT
WIRE GUIDERIGHT .035X150 (WIRE) ×2 IMPLANT

## 2021-04-15 NOTE — Progress Notes (Signed)
Inpatient Diabetes Program Recommendations  AACE/ADA: New Consensus Statement on Inpatient Glycemic Control (2015)  Target Ranges:  Prepandial:   less than 140 mg/dL      Peak postprandial:   less than 180 mg/dL (1-2 hours)      Critically ill patients:  140 - 180 mg/dL   Lab Results  Component Value Date   GLUCAP 173 (H) 04/15/2021   HGBA1C 11.3 (H) 04/13/2021    Review of Glycemic Control Results for CAMREN, LIPSETT (MRN [id number]) as of 04/15/2021 11:40  Ref. Range 04/14/2021 08:12 04/14/2021 12:19 04/14/2021 18:03 04/14/2021 21:34 04/15/2021 08:07 04/15/2021 08:29 04/15/2021 10:31  Glucose-Capillary Latest Ref Range: 70 - 99 mg/dL 209 (H) 252 (H) 132 (H) 188 (H) 197 (H) 205 (H) 173 (H)   Diabetes history: DM2 Outpatient Diabetes medications: Farxiga 10 mg qd, metformin 1 gm q am, Not taking Lantus 15 units qd, nor Victoza qd Current orders for Inpatient glycemic control: Semglee 20 units, Novolog 3 units tid meal coverage, Novolog correction 0-20 units tid, 0-5 units hs  Inpatient Diabetes Program Recommendations:   Spoke with patient regarding diabetes management. Patient not able to verbally explain well why he stopped taking his insulin. States he was able to afford the insulin, and states willingness to restart insulin for discharge home.  Spoke with pt about A1C results 11.3 (average blood glucose 278 over the past 2-3 months) and explained what an A1C is, basic pathophysiology of DM Type 2, basic home care, basic diabetes diet nutrition principles, importance of checking CBGs and maintaining good CBG control to prevent long-term and short-term complications. Reviewed signs and symptoms of hyperglycemia and hypoglycemia and how to treat hypoglycemia at home. Also reviewed blood sugar goals at home.  RNs to provide ongoing basic DM education at bedside with this patient. Have ordered educational booklet and insulin starter kit.  Thank you, Nani Gasser. Jennyfer Nickolson, RN, MSN, CDE   Diabetes Coordinator Inpatient Glycemic Control Team Team Pager [phone] (8am-5pm) 04/15/2021 11:46 AM

## 2021-04-15 NOTE — Progress Notes (Signed)
[facility] Encounter Note  Patient: Justin Keith / Admit Date: 04/12/2021 / Date of Encounter: 04/15/2021, 9:23 AM   Subjective: 11-13.  Patient still with significant concerns of palpitations weakness fatigue as well as chest discomfort since admission.  There is no current evidence of myocardial infarction despite some minimal elevation of troponin.  No evidence of congestive heart failure with normal chest x-ray and exam.  Telemetry has shown normal sinus rhythm.  With concerns of worsening cardiovascular disease coronary bypass graft and obtuse marginal stenosis the patient wishes to further evaluate and treat for the possibility of coronary artery disease by cardiac catheterization  11-14.  Overall patient has done fairly well since last night with no evidence of further chest discomfort or concerns for progression of cardiovascular event.  Patient does have some bradycardia likely secondary to medication management.  Cardiac catheterization showing patent graft to right coronary artery, graft to obtuse marginal 1, patent graft to LIMA to LAD. Patient still has a 70% stenosis unchanged from 2021 of left circumflex feeding obtuse marginal 2.  Discussion with interventional cardiology about above and patient should be medically managed for this at this time and only as outpatient failure would consider the possibility of PCI and stent placement of left circumflex  Review of Systems: Positive for: None Negative for: Vision change, hearing change, syncope, dizziness, nausea, vomiting,diarrhea, bloody stool, stomach pain, cough, congestion, diaphoresis, urinary frequency, urinary pain,skin lesions, skin rashes Others previously listed  Objective: Telemetry: Normal sinus rhythm Physical Exam: Blood pressure 116/66, pulse (!) 55, temperature 98.1 F (36.7 C), temperature source Oral, resp. rate 13, height 5\' 8"  (1.727 m), weight 112.4 kg, SpO2 98 %. Body mass index  is 37.68 kg/m. General: Well developed, well nourished, in no acute distress. Head: Normocephalic, atraumatic, sclera non-icteric, no xanthomas, nares are without discharge. Neck: No apparent masses Lungs: Normal respirations with no wheezes, no rhonchi, no rales , no crackles   Heart: Regular rate and rhythm, normal S1 S2, no murmur, no rub, no gallop, PMI is normal size and placement, carotid upstroke normal without bruit, jugular venous pressure normal Abdomen: Soft, non-tender, non-distended with normoactive bowel sounds. No hepatosplenomegaly. Abdominal aorta is normal size without bruit Extremities: No edema, no clubbing, no cyanosis, no ulcers,  Peripheral: 2+ radial, 2+ femoral, 2+ dorsal pedal pulses Neuro: Alert and oriented. Moves all extremities spontaneously. Psych:  Responds to questions appropriately with a normal affect.   Intake/Output Summary (Last 24 hours) at 04/15/2021 0923 Last data filed at 04/15/2021 0619 Gross per 24 hour  Intake 1602.03 ml  Output 1600 ml  Net 2.03 ml     Inpatient Medications:   [MAR Hold] aspirin  81 mg Oral Daily   [MAR Hold] atorvastatin  80 mg Oral Daily   [MAR Hold] clopidogrel  75 mg Oral Daily   [MAR Hold] insulin aspart  0-20 Units Subcutaneous TID WC   [MAR Hold] insulin aspart  0-5 Units Subcutaneous QHS   [MAR Hold] insulin aspart  3 Units Subcutaneous TID WC   [MAR Hold] insulin glargine-yfgn  20 Units Subcutaneous QHS   [MAR Hold] isosorbide mononitrate  30 mg Oral Daily   [MAR Hold] lisinopril  5 mg Oral Daily   [MAR Hold] metoprolol tartrate  25 mg Oral BID   [MAR Hold] sodium chloride flush  3 mL Intravenous Q12H   Infusions:   sodium chloride     sodium chloride Stopped (04/15/21 0512)   sodium chloride 1 mL/kg/hr (04/15/21 0615)  heparin 1,300 Units/hr (04/15/21 0448)    Labs: Recent Labs    04/14/21 0619 04/15/21 0449  NA 132* 134*  K 4.5 4.5  CL 103 104  CO2 22 23  GLUCOSE 244* 205*  BUN 26* 23   CREATININE 1.47* 1.54*  CALCIUM 9.3 9.5    No results for input(s): AST, ALT, ALKPHOS, BILITOT, PROT, ALBUMIN in the last 72 hours. Recent Labs    04/14/21 0619 04/15/21 0449  WBC 9.1 9.8  HGB 11.3* 11.7*  HCT 34.6* 36.0*  MCV 88.7 89.1  PLT 311 331    No results for input(s): CKTOTAL, CKMB, TROPONINI in the last 72 hours. Invalid input(s): POCBNP Recent Labs    04/13/21 0636  HGBA1C 11.3*      Weights: Filed Weights   04/12/21 1717 04/15/21 0208  Weight: 117.9 kg 112.4 kg     Radiology/Studies:  DG Chest 2 View  Result Date: 04/12/2021 CLINICAL DATA:  Chest pain EXAM: CHEST - 2 VIEW COMPARISON:  Chest x-ray 01/10/2021, CT chest 03/26/2020 FINDINGS: The heart and mediastinal contours are unchanged. No focal consolidation. No pulmonary edema. No pleural effusion. No pneumothorax. No acute osseous abnormality. Right shoulder rotator cuff anchor suture. IMPRESSION: No active cardiopulmonary disease. Electronically Signed   By: Iven Finn M.D.   On: 04/12/2021 17:46   CARDIAC CATHETERIZATION  Result Date: 04/15/2021   Mid LM lesion is 25% stenosed.   Ost LAD lesion is 100% stenosed.   Ost Cx lesion is 50% stenosed.   Dist Cx lesion is 50% stenosed.   Ost RCA to Prox RCA lesion is 100% stenosed.   Ost 1st Mrg lesion is 100% stenosed.   Ost 2nd Mrg lesion is 100% stenosed.   1st RPL lesion is 50% stenosed.   Prox Cx lesion is 75% stenosed.   LV end diastolic pressure is normal. 75 year old male with known hypertension hyperlipidemia and previous coronary bypass graft with progressive episodes of chest discomfort concerning for unstable angina without evidence of acute coronary syndrome and/or myocardial infarction Native vessels 100% occlusion of proximal right coronary artery, proximal LAD, proximal obtuse marginal 1 Patent graft to PDA, obtuse marginal 1, and LIMA to LAD Moderate unchanged stenosis of 70% of mid circumflex artery to obtuse marginal 2 Plan Continue  medication management for further risk reduction cardiovascular event including isosorbide beta-blocker and calcium channel blocker and high intensity cholesterol therapy No further cardiac diagnostics necessary at this time Begin ambulation and follow-up for improvements of symptoms Okay for discharged home with cardiac rehabilitation   ECHOCARDIOGRAM COMPLETE  Result Date: 04/13/2021    ECHOCARDIOGRAM REPORT   Patient Name:   Justin Keith Date of Exam: 04/13/2021 Medical Rec #:  [id number]        Height:       68.0 in Accession #:    [id number]       Weight:       260.0 lb Date of Birth:  1946/04/23        BSA:          2.285 m Patient Age:    76 years         BP:           132/88 mmHg Patient Gender: M                HR:           54 bpm. Exam Location:  [facility] Procedure: 2D Echo, Cardiac Doppler and Color Doppler Indications:  NSTEMI I21.4  History:         Patient has prior history of Echocardiogram examinations. Risk                  Factors:Hypertension and Diabetes.  Sonographer:     Alyse Low Roar Referring Phys:  [id number] Loletha Grayer Diagnosing Phys: Serafina Royals MD IMPRESSIONS  1. Left ventricular ejection fraction, by estimation, is 45 to 50%. The left ventricle has mildly decreased function. The left ventricle demonstrates regional wall motion abnormalities (see scoring diagram/findings for description). Left ventricular diastolic parameters were normal.  2. Right ventricular systolic function is normal. The right ventricular size is normal.  3. The mitral valve is normal in structure. Trivial mitral valve regurgitation.  4. The aortic valve is normal in structure. Aortic valve regurgitation is not visualized. FINDINGS  Left Ventricle: Left ventricular ejection fraction, by estimation, is 45 to 50%. The left ventricle has mildly decreased function. The left ventricle demonstrates regional wall motion abnormalities. Mild hypokinesis of the left ventricular, mid inferoseptal wall and inferior  wall. Definity contrast agent was given IV to delineate the left ventricular endocardial borders. The left ventricular internal cavity size was normal in size. There is no left ventricular hypertrophy. Left ventricular diastolic parameters were normal. Right Ventricle: The right ventricular size is normal. No increase in right ventricular wall thickness. Right ventricular systolic function is normal. Left Atrium: Left atrial size was normal in size. Right Atrium: Right atrial size was normal in size. Pericardium: There is no evidence of pericardial effusion. Mitral Valve: The mitral valve is normal in structure. Trivial mitral valve regurgitation. Tricuspid Valve: The tricuspid valve is normal in structure. Tricuspid valve regurgitation is mild. Aortic Valve: The aortic valve is normal in structure. Aortic valve regurgitation is not visualized. Aortic valve peak gradient measures 7.4 mmHg. Pulmonic Valve: The pulmonic valve was normal in structure. Pulmonic valve regurgitation is not visualized. Aorta: The aortic root and ascending aorta are structurally normal, with no evidence of dilitation. IAS/Shunts: No atrial level shunt detected by color flow Doppler.  LEFT VENTRICLE PLAX 2D LVIDd:         4.60 cm   Diastology LVIDs:         3.50 cm   LV e' medial:    6.20 cm/s LV PW:         1.00 cm   LV E/e' medial:  8.5 LV IVS:        1.20 cm   LV e' lateral:   10.00 cm/s LVOT diam:     2.20 cm   LV E/e' lateral: 5.3 LVOT Area:     3.80 cm  RIGHT VENTRICLE RV Basal diam:  4.30 cm RV Mid diam:    3.60 cm RV S prime:     8.81 cm/s TAPSE (M-mode): 1.6 cm LEFT ATRIUM             Index        RIGHT ATRIUM           Index LA diam:        3.90 cm 1.71 cm/m   RA Area:     16.70 cm LA Vol (A2C):   43.4 ml 18.99 ml/m  RA Volume:   43.60 ml  19.08 ml/m LA Vol (A4C):   54.5 ml 23.85 ml/m LA Biplane Vol: 48.6 ml 21.27 ml/m  AORTIC VALVE                 PULMONIC VALVE AV Area (  Vmax): 2.07 cm     PV Vmax:        0.94 m/s AV Vmax:         136.00 cm/s  PV Peak grad:   3.5 mmHg AV Peak Grad:   7.4 mmHg     RVOT Peak grad: 1 mmHg LVOT Vmax:      74.10 cm/s  AORTA Ao Root diam: 3.70 cm MITRAL VALVE MV Area (PHT): 3.12 cm    SHUNTS MV Decel Time: 243 msec    Systemic Diam: 2.20 cm MV E velocity: 52.70 cm/s MV A velocity: 64.30 cm/s MV E/A ratio:  0.82 MV A Prime:    12.0 cm/s Serafina Royals MD Electronically signed by Serafina Royals MD Signature Date/Time: 04/13/2021/1:12:36 PM    Final      Assessment and Recommendation  75 y.o. male with known coronary disease status post coronary bypass graft chronic kidney disease stage III hypertension hyperlipidemia with continued intermittent chest discomfort needing further evaluation and treatment options Cardiac catheterization showing no significant changes in coronary and graft anatomy since 2021 cardiac catheterization 1.  Continuation of medication management for current coronary artery and graft anatomy and minimal chest discomfort without evidence of acute coronary syndrome  2.  Continuation of continue medication management for further risk reduction in cardiomyopathy LV systolic dysfunction and coronary artery disease 3.  Begin ambulation and follow-up for improvements of symptoms and tolerance of medication management and okay for discharge home from cardiac standpoint with follow-up in 1 week  Signed, Serafina Royals M.D. FACC

## 2021-04-15 NOTE — Discharge Summary (Signed)
[facility] NAME: Justin Keith    MR#:  [id number]  DATE OF BIRTH:  17-Apr-1946  DATE OF ADMISSION:  04/12/2021 ADMITTING PHYSICIAN: Athena Masse, MD  DATE OF DISCHARGE: 04/15/2021  4:06 PM  PRIMARY CARE PHYSICIAN: [facility] [place]    ADMISSION DIAGNOSIS:  Unstable angina ([place]) [I20.0] Elevated troponin level [R77.8] NSTEMI (non-ST elevated myocardial infarction) ([place]) [I21.4] Chest pain, unspecified type [R07.9] Chronic kidney disease, unspecified CKD stage [N18.9] Hyperglycemia due to diabetes mellitus ([place]) [E11.65]  DISCHARGE DIAGNOSIS:  Principal Problem:   NSTEMI (non-ST elevated myocardial infarction) ([place]) Active Problems:   Essential hypertension   CAD (coronary artery disease)   Hyperglycemia due to type 2 diabetes mellitus ([place])   Stage 3b chronic kidney disease ([place])   Hx of CABG   SECONDARY DIAGNOSIS:   Past Medical History:  Diagnosis Date   CAD (coronary artery disease)    CKD (chronic kidney disease)    Diabetes mellitus without complication (HCC)    GERD (gastroesophageal reflux disease)    Hyperlipemia    Hypertension     HOSPITAL COURSE:   Angina with chest pain and slight elevation of troponin but flat trend.  Cardiac catheterization does not show any changes from prior cardiac catheterization.  Patient has a history of CAD recommended medical management with Imdur, aspirin, Plavix, metoprolol and atorvastatin.  LDL still elevated at 218. Type 2 diabetes mellitus with hyperlipidemia and hyperglycemia.  I prescribed Lantus insulin 20 units at night and short acting insulin prior to meals.  Holding Glucophage after cardiac catheterization.  Continue atorvastatin.  Hemoglobin A1c very elevated at 11.3.  Continue Farxiga Chronic kidney disease stage IIIa.  Patient was hydrated before and after cardiac catheterization.  I advised that I wanted to check his kidney function  tomorrow morning prior to disposition.  The patient did not want to stay in the hospital any further and wanted to go home.  I recommend checking a BMP and follow-up appointment then. Hyponatremia.  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  Continue lisinopril and metoprolol.  No signs of heart failure. Essential hypertension on metoprolol, lower dose lisinopril and lower dose Norvasc.  DISCHARGE CONDITIONS:   Satisfactory  CONSULTS OBTAINED:  Cardiology  DRUG ALLERGIES:  No Known Allergies  DISCHARGE MEDICATIONS:   Allergies as of 04/15/2021   No Known Allergies      Medication List     STOP taking these medications    metFORMIN 500 MG 24 hr tablet Commonly known as: GLUCOPHAGE-XR   Victoza 18 MG/3ML Sopn Generic drug: liraglutide       TAKE these medications    amLODipine 5 MG tablet Commonly known as: NORVASC Take 1 tablet (5 mg total) by mouth daily. Start taking on: April 16, 2021 What changed:  medication strength how much to take   aspirin 81 MG chewable tablet Chew 1 tablet (81 mg total) by mouth daily.   atorvastatin 80 MG tablet Commonly known as: LIPITOR Take 80 mg by mouth daily.   blood glucose meter kit and supplies Dispense based on patient and insurance preference. Use up to four times daily as directed. (FOR ICD-10 E10.9, E11.9).   clopidogrel 75 MG tablet Commonly known as: PLAVIX Take 1 tablet (75 mg total) by mouth daily.   Farxiga 10 MG Tabs tablet Generic drug: dapagliflozin propanediol Take 10 mg by mouth daily.   insulin aspart 100 UNIT/ML FlexPen Commonly  known as: NOVOLOG 4 units subcutaneous injection three times a day prior to meals (okay to substitute any generic short acting insulin that insurance covers)   insulin glargine 100 UNIT/ML Solostar Pen Commonly known as: LANTUS Inject 20 Units into the skin at bedtime. What changed: how much to take   Insulin Pen Needle 32G X 4 MM Misc 1 application  by Does not apply route at bedtime.   insulin starter kit- pen needles Misc 1 kit by Other route once for 1 dose.   isosorbide mononitrate 30 MG 24 hr tablet Commonly known as: IMDUR Take 1 tablet (30 mg total) by mouth daily. Start taking on: April 16, 2021   lisinopril 5 MG tablet Commonly known as: ZESTRIL Take 1 tablet (5 mg total) by mouth daily. Start taking on: April 16, 2021 What changed:  medication strength how much to take   metoprolol tartrate 25 MG tablet Commonly known as: LOPRESSOR Take 1 tablet (25 mg total) by mouth 2 (two) times daily.   nitroGLYCERIN 0.4 MG SL tablet Commonly known as: NITROSTAT Place 1 tablet (0.4 mg total) under the tongue every 5 (five) minutes as needed for chest pain.   pantoprazole 40 MG tablet Commonly known as: Protonix Take 1 tablet (40 mg total) by mouth daily.         DISCHARGE INSTRUCTIONS:   Follow-up with PMD 5 days Patient states he has an appointment with Dr. Clayborn Bigness on the 17th.  Keep that appointment  If you experience worsening of your admission symptoms, develop shortness of breath, life threatening emergency, suicidal or homicidal thoughts you must seek medical attention immediately by calling 911 or calling your MD immediately  if symptoms less severe.  You Must read complete instructions/literature along with all the possible adverse reactions/side effects for all the Medicines you take and that have been prescribed to you. Take any new Medicines after you have completely understood and accept all the possible adverse reactions/side effects.   Please note  You were cared for by a hospitalist during your hospital stay. If you have any questions about your discharge medications or the care you received while you were in the hospital after you are discharged, you can call the unit and asked to speak with the hospitalist on call if the hospitalist that took care of you is not available. Once you are  discharged, your primary care physician will handle any further medical issues. Please note that NO REFILLS for any discharge medications will be authorized once you are discharged, as it is imperative that you return to your primary care physician (or establish a relationship with a primary care physician if you do not have one) for your aftercare needs so that they can reassess your need for medications and monitor your lab values.    Today   CHIEF COMPLAINT:   Chief Complaint  Patient presents with   Chest Pain   Hyperglycemia    HISTORY OF PRESENT ILLNESS:  Justin Keith  is a 75 y.o. male came in with chest pain and elevated sugar   VITAL SIGNS:  Blood pressure (!) 114/59, pulse 64, temperature 98.1 F (36.7 C), temperature source Oral, resp. rate 17, height _0  (1.727 m), weight 112.4 kg, SpO2 98 %.  PHYSICAL EXAMINATION:  GENERAL:  75 y.o.-year-old patient lying in the bed with no acute distress.  EYES: Pupils equal, round, reactive to light and accommodation. No scleral icterus. HEENT: Head atraumatic, normocephalic. Oropharynx and nasopharynx clear.  LUNGS: Normal breath  sounds bilaterally, no wheezing, rales,rhonchi or crepitation. No use of accessory muscles of respiration.  CARDIOVASCULAR: S1, S2 normal. No murmurs, rubs, or gallops.  ABDOMEN: Soft, non-tender, non-distended.  EXTREMITIES: Trace pedal edema.  NEUROLOGIC: Cranial nerves II through XII are intact. Muscle strength 5/5 in all extremities. Sensation intact. Gait not checked.  PSYCHIATRIC: The patient is alert and oriented x 3.  SKIN: No obvious rash, lesion, or ulcer.   DATA REVIEW:   CBC Recent Labs  Lab 04/15/21 0449  WBC 9.8  HGB 11.7*  HCT 36.0*  PLT 331    Chemistries  Recent Labs  Lab 04/15/21 0449  NA 134*  K 4.5  CL 104  CO2 23  GLUCOSE 205*  BUN 23  CREATININE 1.54*  CALCIUM 9.5     Microbiology Results  Results for orders placed or performed during the hospital  encounter of 04/12/21  Resp Panel by RT-PCR (Flu A&B, Covid) Nasopharyngeal Swab     Status: None   Collection Time: 04/13/21  1:34 AM   Specimen: Nasopharyngeal Swab; Nasopharyngeal(NP) swabs in vial transport medium  Result Value Ref Range Status   SARS Coronavirus 2 by RT PCR NEGATIVE NEGATIVE Final    Comment: (NOTE) SARS-CoV-2 target nucleic acids are NOT DETECTED.  The SARS-CoV-2 RNA is generally detectable in upper respiratory specimens during the acute phase of infection. The lowest concentration of SARS-CoV-2 viral copies this assay can detect is 138 copies/mL. A negative result does not preclude SARS-Cov-2 infection and should not be used as the sole basis for treatment or other patient management decisions. A negative result may occur with  improper specimen collection/handling, submission of specimen other than nasopharyngeal swab, presence of viral mutation(s) within the areas targeted by this assay, and inadequate number of viral copies(<138 copies/mL). A negative result must be combined with clinical observations, patient history, and epidemiological information. The expected result is Negative.  Fact Sheet for Patients:  EntrepreneurPulse.com.au  Fact Sheet for Healthcare Providers:  IncredibleEmployment.be  This test is no t yet approved or cleared by the [place] FDA and  has been authorized for detection and/or diagnosis of SARS-CoV-2 by FDA under an Emergency Use Authorization (EUA). This EUA will remain  in effect (meaning this test can be used) for the duration of the COVID-19 declaration under Section 564(b)(1) of the Act, 21 U.S.C.section 360bbb-3(b)(1), unless the authorization is terminated  or revoked sooner.       Influenza A by PCR NEGATIVE NEGATIVE Final   Influenza B by PCR NEGATIVE NEGATIVE Final    Comment: (NOTE) The Xpert Xpress SARS-CoV-2/FLU/RSV plus assay is intended as an aid in the diagnosis of  influenza from Nasopharyngeal swab specimens and should not be used as a sole basis for treatment. Nasal washings and aspirates are unacceptable for Xpert Xpress SARS-CoV-2/FLU/RSV testing.  Fact Sheet for Patients: EntrepreneurPulse.com.au  Fact Sheet for Healthcare Providers: IncredibleEmployment.be  This test is not yet approved or cleared by the [place] FDA and has been authorized for detection and/or diagnosis of SARS-CoV-2 by FDA under an Emergency Use Authorization (EUA). This EUA will remain in effect (meaning this test can be used) for the duration of the COVID-19 declaration under Section 564(b)(1) of the Act, 21 U.S.C. section 360bbb-3(b)(1), unless the authorization is terminated or revoked.  Performed at [facility], [address] [place], [place] [address]     RADIOLOGY:  CARDIAC CATHETERIZATION  Result Date: 04/15/2021   Mid LM lesion is 25% stenosed.   Ost LAD  lesion is 100% stenosed.   Ost Cx lesion is 50% stenosed.   Dist Cx lesion is 50% stenosed.   Ost RCA to Prox RCA lesion is 100% stenosed.   Ost 1st Mrg lesion is 100% stenosed.   Ost 2nd Mrg lesion is 100% stenosed.   1st RPL lesion is 50% stenosed.   Prox Cx lesion is 75% stenosed.   LV end diastolic pressure is normal. 75 year old male with known hypertension hyperlipidemia and previous coronary bypass graft with progressive episodes of chest discomfort concerning for unstable angina without evidence of acute coronary syndrome and/or myocardial infarction Native vessels 100% occlusion of proximal right coronary artery, proximal LAD, proximal obtuse marginal 1 Patent graft to PDA, obtuse marginal 1, and LIMA to LAD Moderate unchanged stenosis of 70% of mid circumflex artery to obtuse marginal 2 Plan Continue medication management for further risk reduction cardiovascular event including isosorbide beta-blocker and calcium channel blocker and high intensity  cholesterol therapy No further cardiac diagnostics necessary at this time Begin ambulation and follow-up for improvements of symptoms Okay for discharged home with cardiac rehabilitation      Management plans discussed with the patient, family and they are in agreement.  CODE STATUS:     Code Status Orders  (From admission, onward)           Start     Ordered   04/13/21 0150  Full code  Continuous        04/13/21 0154           Code Status History     Date Active Date Inactive Code Status Order ID Comments User Context   11/25/2019 1652 11/29/2019 1647 Full Code [id number]  Collier Bullock, MD ED   03/15/2019 0237 03/16/2019 1501 Full Code [id number]  Lang Snow, NP ED   10/22/2018 0424 10/22/2018 1808 Full Code [id number]  Mayer Camel, NP ED   09/15/2018 0220 09/15/2018 1744 Full Code [id number]  Lance Coon, MD ED   08/07/2017 1309 08/07/2017 1734 Full Code [id number]  Yolonda Kida, MD Inpatient   12/07/2015 1430 12/08/2015 1510 Full Code [id number]  Epifanio Lesches, MD ED       TOTAL TIME TAKING CARE OF THIS PATIENT: 35 minutes.    Loletha Grayer M.D on 04/15/2021 at 4:15 PM   [facility]  CC: Primary care physician; [facility] [place]

## 2021-04-15 NOTE — Progress Notes (Signed)
ANTICOAGULATION CONSULT NOTE   Pharmacy Consult for Heparin  Indication: chest pain/ACS  No Known Allergies  Patient Measurements: Height: 5\' 8"  (172.7 cm) Weight: 112.4 kg (247 lb 12.8 oz) IBW/kg (Calculated) : 68.4 Heparin Dosing Weight: 95.2 kg   Vital Signs: Temp: 97.9 F (36.6 C) (11/14 0455) Temp Source: Oral (11/14 0455) BP: 137/72 (11/14 0455) Pulse Rate: 59 (11/14 0455)  Labs: Recent Labs    04/12/21 1716 04/12/21 2237 04/13/21 0134 04/13/21 0730 04/13/21 0940 04/13/21 1734 04/14/21 0619 04/15/21 0449  HGB 11.6*  --   --   --   --  11.2* 11.3* 11.7*  HCT 34.8*  --   --   --   --  32.8* 34.6* 36.0*  PLT 317  --   --   --   --  304 311 331  APTT  --   --  30  --   --   --   --   --   LABPROT  --   --  13.4  --   --   --   --   --   INR  --   --  1.0  --   --   --   --   --   HEPARINUNFRC  --   --   --   --    < > 0.33 0.35 0.42  CREATININE 1.78*  --   --  1.52*  --   --  1.47* 1.54*  TROPONINIHS 45* 59*  --  48*  --   --   --   --    < > = values in this interval not displayed.     Estimated Creatinine Clearance: 51.2 mL/min (A) (by C-G formula based on SCr of 1.54 mg/dL (H)).   Medical History: Past Medical History:  Diagnosis Date   CAD (coronary artery disease)    CKD (chronic kidney disease)    Diabetes mellitus without complication (HCC)    GERD (gastroesophageal reflux disease)    Hyperlipemia    Hypertension     Medications:   Assessment: Pharmacy consulted to dose heparin in this 75 year old male admitted with ACS/NSTEMI.  No prior anticoag noted. CrCl = 45.4 ml/min   Date/time HL Comment 11/12 0940 0.35 Therapeutic x1 11/12 1734 0.33 Therapeutic x2 (1300 units/hr) 11/13 0619 0.35 Therapeutic X 3  11/14 0449     0.42     Therapeutic X 4   Goal of Therapy:  Heparin level 0.3-0.7 units/ml Monitor platelets by anticoagulation protocol: Yes   Plan:  11/14:  HL @ 0449 = 0.42, therapeutic X 4 Will continue pt on current rate and  recheck HL on 11/15 with AM labs.   Arish Redner D Clinical Pharmacist 04/15/2021

## 2021-04-15 NOTE — Evaluation (Signed)
Physical Therapy Evaluation Patient Details Name: Justin Keith MRN: [id number] DOB: [birth year] Today's Date: 04/15/2021  History of Present Illness  Pt is a 75 y.o. male with medical history significant for CKD 3B, GERD, DM, CAD s/p CABG, who presents to the ED for evaluation of left anterior chest pain, nonradiating of moderate intensity associated with exertion.  He denies nausea, vomiting or lightheadedness.  He noticed that for the past few days his blood sugars have also been very elevated in the 300s. Underwent cardiac catherization 11/14.   Clinical Impression  Patient A&Ox4, denied pain, catheterization site without drainage. Pt reported at baseline he is independent, lives alone on a 5th floor apartment, no history of falls. He was able to perform bed mobility, transfers, and ambulation independently. No lob noted. The patient demonstrated and reported return to baseline level of functioning, no further acute PT needs indicated. Discussed potential outpatient referral for a touch up of his balance as patient desired, pt verbalized understanding. PT to sign off. Please reconsult PT if pt status changes or acute needs are identified.       Recommendations for follow up therapy are one component of a multi-disciplinary discharge planning process, led by the attending physician.  Recommendations may be updated based on patient status, additional functional criteria and insurance authorization.  Follow Up Recommendations No PT follow up    Assistance Recommended at Discharge None  Functional Status Assessment Patient has not had a recent decline in their functional status  Equipment Recommendations  None recommended by PT    Recommendations for Other Services       Precautions / Restrictions Restrictions Weight Bearing Restrictions: No      Mobility  Bed Mobility Overal bed mobility: Independent                  Transfers Overall transfer level: Independent                       Ambulation/Gait Ambulation/Gait assistance: Independent Gait Distance (Feet): 400 Feet       Gait velocity interpretation: 1.31 - 2.62 ft/sec, indicative of limited community Social research officer, government Rankin (Stroke Patients Only)       Balance Overall balance assessment: Mild deficits observed, not formally tested (pt unable to maintain standing on one leg >5 seconds bilaterally)   Sitting balance-Leahy Scale: Normal       Standing balance-Leahy Scale: Good   Single Leg Stance - Right Leg: 3 Single Leg Stance - Left Leg: 2         High level balance activites: Turns;Sudden stops;Head turns High Level Balance Comments: no LOB noted             Pertinent Vitals/Pain Pain Assessment: No/denies pain    Home Living Family/patient expects to be discharged to:: Private residence Living Arrangements: Alone Available Help at Discharge: Family;Available PRN/intermittently Type of Home: Apartment Home Access: Elevator       Home Layout: One level Home Equipment: None Additional Comments: no falls    Prior Function Prior Level of Function : Independent/Modified Independent                     Hand Dominance        Extremity/Trunk Assessment   Upper Extremity Assessment Upper Extremity Assessment: Overall WFL for tasks assessed  Lower Extremity Assessment Lower Extremity Assessment: Overall WFL for tasks assessed    Cervical / Trunk Assessment Cervical / Trunk Assessment: Normal  Communication   Communication: No difficulties  Cognition Arousal/Alertness: Awake/alert Behavior During Therapy: WFL for tasks assessed/performed Overall Cognitive Status: Within Functional Limits for tasks assessed                                          General Comments      Exercises     Assessment/Plan    PT Assessment Patient does not need any further PT  services  PT Problem List         PT Treatment Interventions      PT Goals (Current goals can be found in the Care Plan section)       Frequency     Barriers to discharge        Co-evaluation               AM-PAC PT "6 Clicks" Mobility  Outcome Measure Help needed turning from your back to your side while in a flat bed without using bedrails?: None Help needed moving from lying on your back to sitting on the side of a flat bed without using bedrails?: None Help needed moving to and from a bed to a chair (including a wheelchair)?: None Help needed standing up from a chair using your arms (e.g., wheelchair or bedside chair)?: None Help needed to walk in hospital room?: None Help needed climbing 3-5 steps with a railing? : None 6 Click Score: 24    End of Session   Activity Tolerance: Patient tolerated treatment well Patient left: in bed;with call bell/phone within reach Nurse Communication: Mobility status PT Visit Diagnosis: Other abnormalities of gait and mobility (R26.89)    Time: 7989-2119 PT Time Calculation (min) (ACUTE ONLY): 11 min   Charges:   PT Evaluation $PT Eval Low Complexity: 1 Low          Lieutenant Diego PT, DPT 2:04 PM,04/15/21

## 2021-05-30 ENCOUNTER — Other Ambulatory Visit: Payer: Self-pay | Admitting: Nephrology

## 2021-05-30 DIAGNOSIS — N281 Cyst of kidney, acquired: Secondary | ICD-10-CM

## 2021-06-10 ENCOUNTER — Ambulatory Visit
Admission: RE | Admit: 2021-06-10 | Discharge: 2021-06-10 | Disposition: A | Discharge status: Home / Self Care | Payer: Medicare Other | Source: Ambulatory Visit | Attending: Nephrology | Admitting: Nephrology

## 2021-06-10 DIAGNOSIS — N281 Cyst of kidney, acquired: Secondary | ICD-10-CM | POA: Insufficient documentation

## 2021-07-13 ENCOUNTER — Emergency Department: Payer: Medicare Other

## 2021-07-13 ENCOUNTER — Emergency Department
Admission: EM | Admit: 2021-07-13 | Discharge: 2021-07-13 | Disposition: A | Discharge status: Home / Self Care | Payer: Medicare Other | Attending: Emergency Medicine | Admitting: Emergency Medicine

## 2021-07-13 ENCOUNTER — Other Ambulatory Visit: Payer: Self-pay

## 2021-07-13 DIAGNOSIS — Z955 Presence of coronary angioplasty implant and graft: Secondary | ICD-10-CM | POA: Insufficient documentation

## 2021-07-13 DIAGNOSIS — N189 Chronic kidney disease, unspecified: Secondary | ICD-10-CM | POA: Diagnosis not present

## 2021-07-13 DIAGNOSIS — E119 Type 2 diabetes mellitus without complications: Secondary | ICD-10-CM | POA: Insufficient documentation

## 2021-07-13 DIAGNOSIS — I2510 Atherosclerotic heart disease of native coronary artery without angina pectoris: Secondary | ICD-10-CM | POA: Insufficient documentation

## 2021-07-13 DIAGNOSIS — R079 Chest pain, unspecified: Secondary | ICD-10-CM | POA: Diagnosis present

## 2021-07-13 DIAGNOSIS — R0789 Other chest pain: Secondary | ICD-10-CM | POA: Diagnosis not present

## 2021-07-13 LAB — TROPONIN I (HIGH SENSITIVITY)
Troponin I (High Sensitivity): 14 ng/L (ref <18)
Troponin I (High Sensitivity): 14 ng/L (ref <18)

## 2021-07-13 LAB — CBC
HCT: 37.9 % — ABNORMAL LOW (ref 39.0–52.0)
Hemoglobin: 12.6 g/dL — ABNORMAL LOW (ref 13.0–17.0)
MCH: 29.8 pg (ref 26.0–34.0)
MCHC: 33.2 g/dL (ref 30.0–36.0)
MCV: 89.6 fL (ref 80.0–100.0)
Platelets: 302 10*3/uL (ref 150–400)
RBC: 4.23 MIL/uL (ref 4.22–5.81)
RDW: 13.4 % (ref 11.5–15.5)
WBC: 9.1 10*3/uL (ref 4.0–10.5)
nRBC: 0 % (ref 0.0–0.2)

## 2021-07-13 LAB — BASIC METABOLIC PANEL
Anion gap: 7 (ref 5–15)
BUN: 24 mg/dL — ABNORMAL HIGH (ref 8–23)
CO2: 25 mmol/L (ref 22–32)
Calcium: 9.4 mg/dL (ref 8.9–10.3)
Chloride: 103 mmol/L (ref 98–111)
Creatinine, Ser: 1.73 mg/dL — ABNORMAL HIGH (ref 0.61–1.24)
GFR, Estimated: 41 mL/min — ABNORMAL LOW (ref >60)
Glucose, Bld: 244 mg/dL — ABNORMAL HIGH (ref 70–99)
Potassium: 4 mmol/L (ref 3.5–5.1)
Sodium: 135 mmol/L (ref 135–145)

## 2021-07-13 NOTE — ED Triage Notes (Signed)
Patient c/o chest pain c/o generalized chest pain described as heaviness. Patient denies accompanying symptoms. Patient reports hx of cardiac stents and bypass surgery.

## 2021-07-13 NOTE — ED Provider Notes (Signed)
[facility] Provider Note    Event Date/Time   First MD Initiated Contact with Patient 07/13/21 1754     (approximate)  History   Chief Complaint: Chest Pain  HPI  Justin Keith is a 76 y.o. male with a past medical history of CAD, CKD, diabetes, status post CABG 2005, presents to the emergency department for chest pain.  According to the patient since this morning he states he has been experiencing intermittent chest discomfort.  Denies any significant discomfort currently but states very slight discomfort in the center of his chest.  Denies any shortness of breath nausea or diaphoresis at any point.  Patient follows up with Dr. Clayborn Bigness of cardiology.  Physical Exam   Triage Vital Signs: ED Triage Vitals  Enc Vitals Group     BP 07/13/21 1749 (!) 154/67     Pulse Rate 07/13/21 1749 (!) 55     Resp 07/13/21 1749 17     Temp 07/13/21 1749 98.7 F (37.1 C)     Temp Source 07/13/21 1749 Oral     SpO2 07/13/21 1749 95 %     Weight 07/13/21 1745 211 lb (95.7 kg)     Height 07/13/21 1745 5\' 8"  (1.727 m)     Head Circumference --      Peak Flow --      Pain Score 07/13/21 1745 5     Pain Loc --      Pain Edu? --      Excl. in [place]? --     Most recent vital signs: Vitals:   07/13/21 1749  BP: (!) 154/67  Pulse: (!) 55  Resp: 17  Temp: 98.7 F (37.1 C)  SpO2: 95%    General: Awake, no distress.  CV:  Good peripheral perfusion.  Regular rate and rhythm  Resp:  Normal effort.  Equal breath sounds bilaterally.  Abd:  No distention.  Soft, nontender.  No rebound or guarding.   ED Results / Procedures / Treatments   EKG  EKG viewed and interpreted by myself shows a normal sinus rhythm at 56 bpm with a narrow QRS, normal axis, PR prolongation consistent with first-degree AV block otherwise normal intervals, no concerning ST changes.  RADIOLOGY  I have personally reviewed the x-ray images of the chest.  No acute abnormality seen on my  evaluation.   MEDICATIONS ORDERED IN ED: Medications - No data to display   IMPRESSION / MDM / [place] / ED COURSE  I reviewed the triage vital signs and the nursing notes.  Patient presents to the emergency department for intermittent chest pain since this morning.  Overall the patient appears well, reassuring physical exam, reassuring vitals.  Patient does have a history of coronary artery disease and a CABG in 2005.  We will check labs including cardiac enzymes.  EKG and chest x-ray.  Reassuring.  We will continue to closely monitor while awaiting lab results.  Patient agreeable to plan of care.  Lab work shows a reassuring CBC, chemistry largely at baseline slightly worse creatinine.  Troponin 14x2, no change in troponin.  I did consider admission for the patient given his significant cardiac history and chest pain symptoms however patient appears well.  Given the patient's reassuring work-up I believe he is safe for discharge home with cardiology follow-up with Dr. Clayborn Bigness.  FINAL CLINICAL IMPRESSION(S) / ED DIAGNOSES   Chest pain  Rx / DC Orders   Cardiology follow-up  Note:  This  document was prepared using Systems analyst and may include unintentional dictation errors.   Harvest Dark, MD 07/13/21 2151

## 2021-10-26 ENCOUNTER — Emergency Department: Payer: Medicare Other

## 2021-10-26 ENCOUNTER — Other Ambulatory Visit: Payer: Self-pay

## 2021-10-26 ENCOUNTER — Inpatient Hospital Stay
Admission: EM | Admit: 2021-10-26 | Discharge: 2021-10-29 | DRG: 311 | Disposition: A | Discharge status: Home / Self Care | Payer: Medicare Other | Attending: Internal Medicine | Admitting: Internal Medicine

## 2021-10-26 ENCOUNTER — Encounter: Payer: Self-pay | Admitting: Internal Medicine

## 2021-10-26 DIAGNOSIS — F17220 Nicotine dependence, chewing tobacco, uncomplicated: Secondary | ICD-10-CM | POA: Diagnosis present

## 2021-10-26 DIAGNOSIS — N1831 Chronic kidney disease, stage 3a: Secondary | ICD-10-CM | POA: Diagnosis present

## 2021-10-26 DIAGNOSIS — I5032 Chronic diastolic (congestive) heart failure: Secondary | ICD-10-CM | POA: Diagnosis present

## 2021-10-26 DIAGNOSIS — Z7902 Long term (current) use of antithrombotics/antiplatelets: Secondary | ICD-10-CM | POA: Diagnosis not present

## 2021-10-26 DIAGNOSIS — E1165 Type 2 diabetes mellitus with hyperglycemia: Secondary | ICD-10-CM | POA: Diagnosis present

## 2021-10-26 DIAGNOSIS — K219 Gastro-esophageal reflux disease without esophagitis: Secondary | ICD-10-CM | POA: Diagnosis present

## 2021-10-26 DIAGNOSIS — I249 Acute ischemic heart disease, unspecified: Principal | ICD-10-CM

## 2021-10-26 DIAGNOSIS — E785 Hyperlipidemia, unspecified: Secondary | ICD-10-CM | POA: Diagnosis present

## 2021-10-26 DIAGNOSIS — Z79899 Other long term (current) drug therapy: Secondary | ICD-10-CM

## 2021-10-26 DIAGNOSIS — Z951 Presence of aortocoronary bypass graft: Secondary | ICD-10-CM | POA: Diagnosis not present

## 2021-10-26 DIAGNOSIS — Z794 Long term (current) use of insulin: Secondary | ICD-10-CM

## 2021-10-26 DIAGNOSIS — E1122 Type 2 diabetes mellitus with diabetic chronic kidney disease: Secondary | ICD-10-CM | POA: Diagnosis present

## 2021-10-26 DIAGNOSIS — Z6839 Body mass index (BMI) 39.0-39.9, adult: Secondary | ICD-10-CM

## 2021-10-26 DIAGNOSIS — R778 Other specified abnormalities of plasma proteins: Secondary | ICD-10-CM | POA: Diagnosis present

## 2021-10-26 DIAGNOSIS — I130 Hypertensive heart and chronic kidney disease with heart failure and stage 1 through stage 4 chronic kidney disease, or unspecified chronic kidney disease: Secondary | ICD-10-CM | POA: Diagnosis present

## 2021-10-26 DIAGNOSIS — I2510 Atherosclerotic heart disease of native coronary artery without angina pectoris: Secondary | ICD-10-CM | POA: Diagnosis present

## 2021-10-26 DIAGNOSIS — Z7982 Long term (current) use of aspirin: Secondary | ICD-10-CM | POA: Diagnosis not present

## 2021-10-26 DIAGNOSIS — I10 Essential (primary) hypertension: Secondary | ICD-10-CM | POA: Diagnosis present

## 2021-10-26 DIAGNOSIS — R079 Chest pain, unspecified: Principal | ICD-10-CM

## 2021-10-26 DIAGNOSIS — E66813 Obesity, class 3: Secondary | ICD-10-CM | POA: Diagnosis present

## 2021-10-26 LAB — COMPREHENSIVE METABOLIC PANEL WITH GFR
ALT: 22 U/L (ref 0–44)
AST: 23 U/L (ref 15–41)
Albumin: 3.5 g/dL (ref 3.5–5.0)
Alkaline Phosphatase: 64 U/L (ref 38–126)
Anion gap: 11 (ref 5–15)
BUN: 20 mg/dL (ref 8–23)
CO2: 19 mmol/L — ABNORMAL LOW (ref 22–32)
Calcium: 9.3 mg/dL (ref 8.9–10.3)
Chloride: 104 mmol/L (ref 98–111)
Creatinine, Ser: 1.6 mg/dL — ABNORMAL HIGH (ref 0.61–1.24)
GFR, Estimated: 45 mL/min — ABNORMAL LOW
Glucose, Bld: 254 mg/dL — ABNORMAL HIGH (ref 70–99)
Potassium: 3.8 mmol/L (ref 3.5–5.1)
Sodium: 134 mmol/L — ABNORMAL LOW (ref 135–145)
Total Bilirubin: 0.7 mg/dL (ref 0.3–1.2)
Total Protein: 7.7 g/dL (ref 6.5–8.1)

## 2021-10-26 LAB — CBC WITH DIFFERENTIAL/PLATELET
Abs Immature Granulocytes: 0.02 10*3/uL (ref 0.00–0.07)
Basophils Absolute: 0 10*3/uL (ref 0.0–0.1)
Basophils Relative: 1 %
Eosinophils Absolute: 0.2 10*3/uL (ref 0.0–0.5)
Eosinophils Relative: 2 %
HCT: 38.3 % — ABNORMAL LOW (ref 39.0–52.0)
Hemoglobin: 12.4 g/dL — ABNORMAL LOW (ref 13.0–17.0)
Immature Granulocytes: 0 %
Lymphocytes Relative: 19 %
Lymphs Abs: 1.4 10*3/uL (ref 0.7–4.0)
MCH: 28.2 pg (ref 26.0–34.0)
MCHC: 32.4 g/dL (ref 30.0–36.0)
MCV: 87.2 fL (ref 80.0–100.0)
Monocytes Absolute: 0.6 10*3/uL (ref 0.1–1.0)
Monocytes Relative: 8 %
Neutro Abs: 5.1 10*3/uL (ref 1.7–7.7)
Neutrophils Relative %: 70 %
Platelets: 351 10*3/uL (ref 150–400)
RBC: 4.39 MIL/uL (ref 4.22–5.81)
RDW: 14.4 % (ref 11.5–15.5)
WBC: 7.2 10*3/uL (ref 4.0–10.5)
nRBC: 0 % (ref 0.0–0.2)

## 2021-10-26 LAB — HEMOGLOBIN A1C
Hgb A1c MFr Bld: 11.3 % — ABNORMAL HIGH (ref 4.8–5.6)
Mean Plasma Glucose: 277.61 mg/dL

## 2021-10-26 LAB — PROTIME-INR
INR: 1.1 (ref 0.8–1.2)
Prothrombin Time: 14.1 s (ref 11.4–15.2)

## 2021-10-26 LAB — TROPONIN I (HIGH SENSITIVITY)
Troponin I (High Sensitivity): 94 ng/L — ABNORMAL HIGH
Troponin I (High Sensitivity): 103 ng/L (ref <18)
Troponin I (High Sensitivity): 99 ng/L — ABNORMAL HIGH

## 2021-10-26 LAB — BRAIN NATRIURETIC PEPTIDE: B Natriuretic Peptide: 106.8 pg/mL — ABNORMAL HIGH (ref 0.0–100.0)

## 2021-10-26 LAB — CBG MONITORING, ED: Glucose-Capillary: 198 mg/dL — ABNORMAL HIGH (ref 70–99)

## 2021-10-26 LAB — GLUCOSE, CAPILLARY: Glucose-Capillary: 146 mg/dL — ABNORMAL HIGH (ref 70–99)

## 2021-10-26 LAB — LIPASE, BLOOD: Lipase: 32 U/L (ref 11–51)

## 2021-10-26 LAB — HEPARIN LEVEL (UNFRACTIONATED): Heparin Unfractionated: 0.46 IU/mL (ref 0.30–0.70)

## 2021-10-26 LAB — APTT: aPTT: 168 seconds (ref 24–36)

## 2021-10-26 MED ORDER — CLOPIDOGREL BISULFATE 75 MG PO TABS
75.0000 mg | ORAL_TABLET | Freq: Every day | ORAL | Status: DC
  Administered 2021-10-27 – 2021-10-29 (×3): 75 mg via ORAL
  Filled 2021-10-26 (×3): qty 1

## 2021-10-26 MED ORDER — PANTOPRAZOLE SODIUM 40 MG PO TBEC
40.0000 mg | DELAYED_RELEASE_TABLET | Freq: Every day | ORAL | Status: DC
  Administered 2021-10-27 – 2021-10-29 (×3): 40 mg via ORAL
  Filled 2021-10-26 (×3): qty 1

## 2021-10-26 MED ORDER — ASPIRIN 81 MG PO CHEW
81.0000 mg | CHEWABLE_TABLET | Freq: Every day | ORAL | Status: DC
  Administered 2021-10-27 – 2021-10-29 (×3): 81 mg via ORAL
  Filled 2021-10-26 (×3): qty 1

## 2021-10-26 MED ORDER — INSULIN ASPART 100 UNIT/ML IJ SOLN
0.0000 [IU] | Freq: Three times a day (TID) | INTRAMUSCULAR | Status: DC
  Administered 2021-10-26 – 2021-10-27 (×2): 4 [IU] via SUBCUTANEOUS
  Administered 2021-10-27: 7 [IU] via SUBCUTANEOUS
  Administered 2021-10-27 – 2021-10-28 (×2): 4 [IU] via SUBCUTANEOUS
  Administered 2021-10-28: 3 [IU] via SUBCUTANEOUS
  Administered 2021-10-28 – 2021-10-29 (×2): 4 [IU] via SUBCUTANEOUS
  Filled 2021-10-26 (×8): qty 1

## 2021-10-26 MED ORDER — METOPROLOL TARTRATE 25 MG PO TABS
25.0000 mg | ORAL_TABLET | Freq: Two times a day (BID) | ORAL | Status: DC
  Administered 2021-10-28: 25 mg via ORAL
  Filled 2021-10-26 (×3): qty 1

## 2021-10-26 MED ORDER — NITROGLYCERIN 0.4 MG SL SUBL: 0.4000 mg | SUBLINGUAL_TABLET | SUBLINGUAL | Status: DC | PRN

## 2021-10-26 MED ORDER — MORPHINE SULFATE (PF) 2 MG/ML IV SOLN
1.0000 mg | Freq: Once | INTRAVENOUS | Status: AC
  Administered 2021-10-26: 1 mg via INTRAVENOUS
  Filled 2021-10-26: qty 1

## 2021-10-26 MED ORDER — HEPARIN BOLUS VIA INFUSION
4000.0000 [IU] | Freq: Once | INTRAVENOUS | Status: AC
  Administered 2021-10-26: 4000 [IU] via INTRAVENOUS
  Filled 2021-10-26: qty 4000

## 2021-10-26 MED ORDER — ACETAMINOPHEN 325 MG PO TABS: 650.0000 mg | ORAL_TABLET | ORAL | Status: DC | PRN

## 2021-10-26 MED ORDER — LISINOPRIL 10 MG PO TABS
10.0000 mg | ORAL_TABLET | Freq: Every day | ORAL | Status: DC
  Administered 2021-10-27 – 2021-10-29 (×3): 10 mg via ORAL
  Filled 2021-10-26 (×4): qty 1

## 2021-10-26 MED ORDER — HEPARIN (PORCINE) 25000 UT/250ML-% IV SOLN
1250.0000 [IU]/h | INTRAVENOUS | Status: AC
  Administered 2021-10-26 – 2021-10-28 (×3): 1250 [IU]/h via INTRAVENOUS
  Filled 2021-10-26 (×3): qty 250

## 2021-10-26 MED ORDER — ISOSORBIDE MONONITRATE ER 30 MG PO TB24
30.0000 mg | ORAL_TABLET | Freq: Every day | ORAL | Status: DC
  Administered 2021-10-27: 30 mg via ORAL
  Filled 2021-10-26: qty 1

## 2021-10-26 MED ORDER — ATORVASTATIN CALCIUM 80 MG PO TABS
80.0000 mg | ORAL_TABLET | Freq: Every day | ORAL | Status: DC
  Administered 2021-10-27 – 2021-10-29 (×3): 80 mg via ORAL
  Filled 2021-10-26 (×3): qty 1

## 2021-10-26 MED ORDER — INSULIN GLARGINE-YFGN 100 UNIT/ML ~~LOC~~ SOLN
20.0000 [IU] | Freq: Every day | SUBCUTANEOUS | Status: DC
  Administered 2021-10-26 – 2021-10-28 (×3): 20 [IU] via SUBCUTANEOUS
  Filled 2021-10-26 (×4): qty 0.2

## 2021-10-26 MED ORDER — ONDANSETRON HCL 4 MG/2ML IJ SOLN: 4.0000 mg | Freq: Four times a day (QID) | INTRAMUSCULAR | Status: DC | PRN

## 2021-10-26 NOTE — Progress Notes (Signed)
ANTICOAGULATION CONSULT NOTE - Initial Consult  Pharmacy Consult for IV heparin Indication: chest pain/ACS/STEMI  No Known Allergies  Patient Measurements: Height: '5\' 8"'$  (172.7 cm) Weight: 116.6 kg (257 lb) IBW/kg (Calculated) : 68.4 Heparin Dosing Weight: 94.8 kg  Vital Signs: Temp: 98.1 F (36.7 C) (05/27 1824) Temp Source: Oral (05/27 1824) BP: 142/86 (05/27 1940) Pulse Rate: 54 (05/27 1940)  Labs: Recent Labs    10/26/21 0825 10/26/21 1109 10/26/21 1433 10/26/21 1650 10/26/21 2133  HGB 12.4*  --   --   --   --   HCT 38.3*  --   --   --   --   PLT 351  --   --   --   --   APTT  --   --  168*  --   --   LABPROT  --   --  14.1  --   --   INR  --   --  1.1  --   --   HEPARINUNFRC  --   --   --   --  0.46  CREATININE 1.60*  --   --   --   --   TROPONINIHS 94* 103*  --  99*  --     Estimated Creatinine Clearance: 49.5 mL/min (A) (by C-G formula based on SCr of 1.6 mg/dL (H)).  Medical History: Past Medical History:  Diagnosis Date   CAD (coronary artery disease)    CKD (chronic kidney disease)    Diabetes mellitus without complication (HCC)    GERD (gastroesophageal reflux disease)    Hyperlipemia    Hypertension    Medications:  Scheduled:   [START ON 10/27/2021] aspirin  81 mg Oral Daily   [START ON 10/27/2021] atorvastatin  80 mg Oral Daily   [START ON 10/27/2021] clopidogrel  75 mg Oral Daily   insulin aspart  0-20 Units Subcutaneous TID WC   insulin glargine-yfgn  20 Units Subcutaneous QHS   [START ON 10/27/2021] isosorbide mononitrate  30 mg Oral Daily   [START ON 10/27/2021] lisinopril  10 mg Oral Daily   metoprolol tartrate  25 mg Oral BID   [START ON 10/27/2021] pantoprazole  40 mg Oral Daily   Infusions:   heparin 1,250 Units/hr (10/26/21 1337)   PRN: acetaminophen, nitroGLYCERIN, ondansetron (ZOFRAN) IV  Not on PTA anticoagulation per my chart review  Assessment: 76 year old male presenting with left sided chest pain. PMH coronary artery  disease status post CABG, diabetes mellitus with complications of stage IIIa chronic kidney disease, hypertension, dyslipidemia, morbid obesity. Initial troponin elevated.   Baseline labs: Hgb, Plts WNL.Ordered aPTT, PT-INR, however heparin started prior to labs drawn due to pt status.  Goal of Therapy:  Heparin level 0.3-0.7 units/ml Monitor platelets by anticoagulation protocol: Yes   Plan:  5/27:  HL @ 2133 = 0.46, therapeutic X 1 Will continue pt on current rate and recheck HL on 5/28 with AM labs.   Tanish Prien D, PharmD 10/26/2021 10:26 PM

## 2021-10-26 NOTE — H&P (Signed)
History and Physical    Patient: Justin Keith:[id number] DOB: 12/08/1945 DOA: 10/26/2021 DOS: the patient was seen and examined on 10/26/2021 PCP: [facility] [place]  Patient coming from: Home  Chief Complaint: No chief complaint on file.  HPI: Justin Keith is a 76 y.o. male with medical history significant for coronary artery disease status post CABG, diabetes mellitus with complications of stage IIIa chronic kidney disease, hypertension, dyslipidemia, morbid obesity who presents to the ER for evaluation of left sided chest pain which started about 3 AM at rest.  He described the pain as a squeezing sensation radiating down his abdomen lasting about 30 minutes.  He took a baby aspirin without any significant improvement in his symptoms and so he called EMS. He denied having any shortness of breath, no nausea, no vomiting or diaphoresis. He denies having any cough, no fever, no chills, no headache, no dizziness, no lightheadedness, no nausea, no vomiting, no changes in his bowel habits.  He is not His initial troponin is elevated. During my evaluation he continues to have left anterior chest wall pain which he rates a 4 x 10 in intensity at its worst  Review of Systems: As mentioned in the history of present illness. All other systems reviewed and are negative. Past Medical History:  Diagnosis Date   CAD (coronary artery disease)    CKD (chronic kidney disease)    Diabetes mellitus without complication (HCC)    GERD (gastroesophageal reflux disease)    Hyperlipemia    Hypertension    Past Surgical History:  Procedure Laterality Date   CARDIAC CATHETERIZATION     CARDIAC SURGERY     COLONOSCOPY WITH PROPOFOL N/A 02/25/2018   Procedure: COLONOSCOPY WITH PROPOFOL;  Surgeon: Jonathon Bellows, MD;  Location[facility] ENDOSCOPY;  Service: Gastroenterology;  Laterality: N/A;   COLONOSCOPY WITH PROPOFOL N/A 02/26/2018   Procedure: COLONOSCOPY WITH PROPOFOL;  Surgeon: Jonathon Bellows, MD;  Location: [facility] ENDOSCOPY;  Service: Gastroenterology;  Laterality: N/A;   ESOPHAGOGASTRODUODENOSCOPY (EGD) WITH PROPOFOL N/A 01/18/2019   Procedure: ESOPHAGOGASTRODUODENOSCOPY (EGD) WITH PROPOFOL;  Surgeon: Lucilla Lame, MD;  Location: [facility] ENDOSCOPY;  Service: Endoscopy;  Laterality: N/A;   FOREIGN BODY REMOVAL N/A 03/07/2018   Procedure: FOREIGN BODY REMOVAL;  Surgeon: Jonathon Bellows, MD;  Location: [facility] ENDOSCOPY;  Service: Gastroenterology;  Laterality: N/A;   LEFT HEART CATH AND CORS/GRAFTS ANGIOGRAPHY N/A 08/07/2017   Procedure: LEFT HEART CATH AND CORS/GRAFTS ANGIOGRAPHY;  Surgeon: Yolonda Kida, MD;  Location: [place] CV LAB;  Service: Cardiovascular;  Laterality: N/A;   LEFT HEART CATH AND CORS/GRAFTS ANGIOGRAPHY N/A 11/28/2019   Procedure: LEFT HEART CATH AND CORS/GRAFTS ANGIOGRAPHY;  Surgeon: Teodoro Spray, MD;  Location: [place] CV LAB;  Service: Cardiovascular;  Laterality: N/A;   LEFT HEART CATH AND CORS/GRAFTS ANGIOGRAPHY N/A 04/15/2021   Procedure: LEFT HEART CATH AND CORS/GRAFTS ANGIOGRAPHY;  Surgeon: Corey Skains, MD;  Location: [place] CV LAB;  Service: Cardiovascular;  Laterality: N/A;   PROSTATE SURGERY     SHOULDER ARTHROSCOPY     Social History:  reports that he has never smoked. His smokeless tobacco use includes chew. He reports that he does not currently use drugs. He reports that he does not drink alcohol.  No Known Allergies  No family history on file.  Prior to Admission medications   Medication Sig Start Date End Date Taking? Authorizing Provider  amLODipine (NORVASC) 5 MG tablet Take 1 tablet (5 mg total) by mouth daily. 04/16/21  Yes Loletha Grayer, MD  aspirin 81 MG chewable tablet Chew 1 tablet (81 mg total) by mouth daily. 12/08/15  Yes Vaughan Basta, MD  atorvastatin (LIPITOR) 80 MG tablet Take 80 mg by mouth daily.    Yes [provider]  clopidogrel (PLAVIX) 75 MG tablet Take 1 tablet (75 mg total) by  mouth daily. 11/29/19  Yes Minette Brine, Nicole L, PA-C  FARXIGA 10 MG TABS tablet Take 10 mg by mouth daily. 02/05/21  Yes [provider]  Cleda Clarks 100 UNIT/ML KwikPen  08/19/21  Yes [provider]  insulin aspart (NOVOLOG) 100 UNIT/ML FlexPen 4 units subcutaneous injection three times a day prior to meals (okay to substitute any generic short acting insulin that insurance covers) 04/15/21  Yes Wieting, Richard, MD  insulin glargine (LANTUS) 100 UNIT/ML Solostar Pen Inject 20 Units into the skin at bedtime. 04/15/21  Yes Wieting, Richard, MD  isosorbide mononitrate (IMDUR) 30 MG 24 hr tablet Take 1 tablet (30 mg total) by mouth daily. 04/16/21  Yes Wieting, Richard, MD  ketorolac (ACULAR) 0.5 % ophthalmic solution SMARTSIG:In Eye(s) 09/23/21  Yes [provider]  lisinopril (ZESTRIL) 10 MG tablet Take 1 tablet by mouth daily. 08/19/21  Yes [provider]  metoprolol tartrate (LOPRESSOR) 25 MG tablet Take 1 tablet (25 mg total) by mouth 2 (two) times daily. 11/29/19  Yes Jennye Boroughs, MD  pantoprazole (PROTONIX) 40 MG tablet Take 1 tablet (40 mg total) by mouth daily. 01/18/19 10/26/21 Yes Lucilla Lame, MD  blood glucose meter kit and supplies Dispense based on patient and insurance preference. Use up to four times daily as directed. (FOR ICD-10 E10.9, E11.9). 11/29/19   Jennye Boroughs, MD  Insulin Pen Needle 32G X 4 MM MISC 1 application by Does not apply route at bedtime. 11/29/19   Jennye Boroughs, MD  lisinopril (ZESTRIL) 5 MG tablet Take 1 tablet (5 mg total) by mouth daily. Patient not taking: Reported on 10/26/2021 04/16/21   Loletha Grayer, MD  nitroGLYCERIN (NITROSTAT) 0.4 MG SL tablet Place 1 tablet (0.4 mg total) under the tongue every 5 (five) minutes as needed for chest pain. 01/10/21   Paulette Blanch, MD    Physical Exam: Vitals:   10/26/21 0820 10/26/21 0941 10/26/21 1000 10/26/21 1030  BP:  107/61 121/64 123/76  Pulse:  (!) 49 (!) 51 (!) 52  Resp:   18 (!) 21 16  Temp:      TempSrc:      SpO2:  95% 96% 97%  Weight: 116.6 kg     Height: 5' 8" (1.727 m)      Physical Exam Vitals and nursing note reviewed.  Constitutional:      Appearance: He is obese.  HENT:     Head: Normocephalic and atraumatic.     Nose: Nose normal.     Mouth/Throat:     Mouth: Mucous membranes are moist.  Eyes:     Pupils: Pupils are equal, round, and reactive to light.  Cardiovascular:     Rate and Rhythm: Bradycardia present.  Pulmonary:     Effort: Pulmonary effort is normal.     Breath sounds: Normal breath sounds.  Abdominal:     General: Bowel sounds are normal.     Palpations: Abdomen is soft.     Comments: Central adiposity  Musculoskeletal:        General: Normal range of motion.     Cervical back: Normal range of motion and neck supple.  Skin:  General: Skin is warm and dry.  Neurological:     General: No focal deficit present.     Mental Status: He is alert.  Psychiatric:        Mood and Affect: Mood normal.        Behavior: Behavior normal.    Data Reviewed: Relevant notes from primary care and specialist visits, past discharge summaries as available in EHR, including Care Everywhere. Prior diagnostic testing as pertinent to current admission diagnoses Updated medications and problem lists for reconciliation ED course, including vitals, labs, imaging, treatment and response to treatment Triage notes, nursing and pharmacy notes and ED provider's notes Notable results as noted in HPI Labs reviewed.  BNP 106, sodium 134, potassium 3.8, chloride 104, bicarb 19, glucose 254, BUN 20, creatinine 1.60, calcium 9.3, total protein 7.7, albumin 3.5, AST 23, ALT 22, alkaline phosphatase 64, troponin 94, lipase 32, white count 7.2, hemoglobin 12.4, hematocrit 38.3, MCV 87.2, RDW 14.4, platelet count 351 Chest x-ray reviewed by me shows no acute findings Twelve-lead EKG reviewed by me shows sinus rhythm with prolonged PR interval There are no  new results to review at this time.  Assessment and Plan: * ACS (acute coronary syndrome) [facility]) Patient presents for evaluation of chest pain at rest with elevated troponin levels. We will obtain serial troponin levels Start patient on a heparin drip if troponin shows an upward trend Continue aspirin, Plavix, metoprolol with holding parameters and atorvastatin Obtain 2D echocardiogram to assess LVEF and rule out regional wall motion abnormality Consult cardiology  Obesity, Class III, BMI 40-49.9 (morbid obesity) ([place]) Patient has a BMI of 46.96 Complicates overall prognosis and care Lifestyle modification and exercise has been discussed with him in detail  Stage 3a chronic kidney disease ([place]) Secondary to diabetes mellitus Renal function is stable Monitor closely during this hospitalization   Hyperglycemia due to type 2 diabetes mellitus ([place]) Patient has type 2 diabetes mellitus with hyperglycemia Optimize glycemic control Maintain consistent carbohydrate diet Sliding scale insulin  CAD (coronary artery disease) Patient has a known history of coronary artery disease status post CABG Continue aspirin, Plavix, atorvastatin, nitrates and metoprolol  Essential hypertension Continue metoprolol with holding parameters, nitrates and lisinopril      Advance Care Planning:   Code Status: Prior full code  Consults: Cardiology  Family Communication: Greater than 50% of time was spent discussing patient's condition and plan of care with him at the bedside.  All questions and concerns have been addressed.  He verbalizes understanding and agrees with the plan  Severity of Illness: The appropriate patient status for this patient is INPATIENT. Inpatient status is judged to be reasonable and necessary in order to provide the required intensity of service to ensure the patient's safety. The patient's presenting symptoms, physical exam findings, and initial radiographic and laboratory  data in the context of their chronic comorbidities is felt to place them at high risk for further clinical deterioration. Furthermore, it is not anticipated that the patient will be medically stable for discharge from the hospital within 2 midnights of admission.   * I certify that at the point of admission it is my clinical judgment that the patient will require inpatient hospital care spanning beyond 2 midnights from the point of admission due to high intensity of service, high risk for further deterioration and high frequency of surveillance required.*  Author: Collier Bullock, MD 10/26/2021 11:41 AM  For on call review [email or web address].

## 2021-10-26 NOTE — Plan of Care (Signed)
Patient admitted from ED. From home alone but has a support system from his siblings. AAOx4 denies SOB, Heparin infusing per order.

## 2021-10-26 NOTE — Assessment & Plan Note (Signed)
Patient has a BMI of 77.41 Complicates overall prognosis and care Lifestyle modification and exercise has been discussed with him in detail

## 2021-10-26 NOTE — Consult Note (Signed)
[facility] Cardiology  CARDIOLOGY CONSULT NOTE  Patient ID: Justin Keith MRN: [id number] DOB/AGE: [birth year] 76 y.o.  Admit date: 10/26/2021 Referring Physician [place], [place] Primary Cardiologist [facility] Reason for Consultation h/o CABG, chest pain, elevated troponin  HPI:  Justin Keith is a 76 year old male with history of CAD s/p CABG x3 2005, hypertension, hyperlipidemia, type 2 diabetes, morbid obesity, CKD who presents with chest pain and was discovered to have a mildly elevated troponin.  He reports that he developed left-sided chest pain around 3 AM today while resting.  Describes it as a squeezing sensation radiating down his abdomen and lasting for about 30 minutes.  He had no other associated symptoms. History is somewhat limited, but this is slightly different than his usual heart pain in that it radiates to his abdomen. ECG shows no ischemic changes on arrival to the emergency department.  Signs are notable for heart rate in the 50s, and a blood pressure of 120/74.  Initial troponin is elevated at 94, and 103 on repeat.  The patient historically follows with Dr. Clayborn Keith, and was most recently admitted in November 2022 for chest discomfort.  During that admission his troponin was mildly elevated to 59 he subsequently underwent heart catheterization which showed patent bypass grafts and severe native coronary disease, it was felt that medical management was most appropriate.  Review of systems complete and found to be negative unless listed above     Past Medical History:  Diagnosis Date   CAD (coronary artery disease)    CKD (chronic kidney disease)    Diabetes mellitus without complication (HCC)    GERD (gastroesophageal reflux disease)    Hyperlipemia    Hypertension     Past Surgical History:  Procedure Laterality Date   CARDIAC CATHETERIZATION     CARDIAC SURGERY     COLONOSCOPY WITH PROPOFOL N/A 02/25/2018    Procedure: COLONOSCOPY WITH PROPOFOL;  Surgeon: Jonathon Bellows, MD;  Location: [facility] ENDOSCOPY;  Service: Gastroenterology;  Laterality: N/A;   COLONOSCOPY WITH PROPOFOL N/A 02/26/2018   Procedure: COLONOSCOPY WITH PROPOFOL;  Surgeon: Jonathon Bellows, MD;  Location: [facility] ENDOSCOPY;  Service: Gastroenterology;  Laterality: N/A;   ESOPHAGOGASTRODUODENOSCOPY (EGD) WITH PROPOFOL N/A 01/18/2019   Procedure: ESOPHAGOGASTRODUODENOSCOPY (EGD) WITH PROPOFOL;  Surgeon: Lucilla Lame, MD;  Location: [facility] ENDOSCOPY;  Service: Endoscopy;  Laterality: N/A;   FOREIGN BODY REMOVAL N/A 03/07/2018   Procedure: FOREIGN BODY REMOVAL;  Surgeon: Jonathon Bellows, MD;  Location: [facility] ENDOSCOPY;  Service: Gastroenterology;  Laterality: N/A;   LEFT HEART CATH AND CORS/GRAFTS ANGIOGRAPHY N/A 08/07/2017   Procedure: LEFT HEART CATH AND CORS/GRAFTS ANGIOGRAPHY;  Surgeon: Yolonda Kida, MD;  Location: [place] CV LAB;  Service: Cardiovascular;  Laterality: N/A;   LEFT HEART CATH AND CORS/GRAFTS ANGIOGRAPHY N/A 11/28/2019   Procedure: LEFT HEART CATH AND CORS/GRAFTS ANGIOGRAPHY;  Surgeon: Teodoro Spray, MD;  Location: [place] CV LAB;  Service: Cardiovascular;  Laterality: N/A;   LEFT HEART CATH AND CORS/GRAFTS ANGIOGRAPHY N/A 04/15/2021   Procedure: LEFT HEART CATH AND CORS/GRAFTS ANGIOGRAPHY;  Surgeon: Corey Skains, MD;  Location: [place] CV LAB;  Service: Cardiovascular;  Laterality: N/A;   PROSTATE SURGERY     SHOULDER ARTHROSCOPY      (Not in a hospital admission)  Social History   Socioeconomic History   Marital status: Single    Spouse name: Not on file   Number of children: Not on file   Years of education: Not on file  Highest education level: Not on file  Occupational History   Not on file  Tobacco Use   Smoking status: Never   Smokeless tobacco: Current    Types: Chew  Vaping Use   Vaping Use: Never used  Substance and Sexual Activity   Alcohol use: No   Drug use: Not Currently   Sexual  activity: Not on file  Other Topics Concern   Not on file  Social History Narrative   Not on file   Social Determinants of Health   Financial Resource Strain: Not on file  Food Insecurity: Not on file  Transportation Needs: Not on file  Physical Activity: Not on file  Stress: Not on file  Social Connections: Not on file  Intimate Partner Violence: Not on file    No family history on file.    Review of systems complete and found to be negative unless listed above      PHYSICAL EXAM  General: Well developed, well nourished, in no acute distress HEENT:  Normocephalic and atramatic Neck:  No JVD.  Lungs: Clear bilaterally to auscultation and percussion. Heart: HRRR . Normal S1 and S2 without gallops or murmurs.  Abdomen: Bowel sounds are positive, abdomen soft and non-tender  Msk:  Back normal, normal gait. Normal strength and tone for age. Extremities: No clubbing, cyanosis or edema.   Neuro: Alert and oriented X 3. Psych:  Good affect, responds appropriately  Labs:   Lab Results  Component Value Date   WBC 7.2 10/26/2021   HGB 12.4 (L) 10/26/2021   HCT 38.3 (L) 10/26/2021   MCV 87.2 10/26/2021   PLT 351 10/26/2021    Recent Labs  Lab 10/26/21 0825  NA 134*  K 3.8  CL 104  CO2 19*  BUN 20  CREATININE 1.60*  CALCIUM 9.3  PROT 7.7  BILITOT 0.7  ALKPHOS 64  ALT 22  AST 23  GLUCOSE 254*   Lab Results  Component Value Date   CKTOTAL 226 07/25/2017   CKMB 4.8 (H) 09/19/2011   TROPONINI <0.03 10/22/2018    Lab Results  Component Value Date   CHOL 319 (H) 04/13/2021   CHOL 166 11/26/2019   CHOL 170 03/15/2019   Lab Results  Component Value Date   HDL 37 (L) 04/13/2021   HDL 40 (L) 11/26/2019   HDL 44 03/15/2019   Lab Results  Component Value Date   LDLCALC 218 (H) 04/13/2021   LDLCALC 89 11/26/2019   LDLCALC 103 (H) 03/15/2019   Lab Results  Component Value Date   TRIG 320 (H) 04/13/2021   TRIG 183 (H) 11/26/2019   TRIG 115 03/15/2019    Lab Results  Component Value Date   CHOLHDL 8.6 04/13/2021   CHOLHDL 4.2 11/26/2019   CHOLHDL 3.9 03/15/2019   No results found for: LDLDIRECT    Radiology: DG Chest 2 View  Result Date: 10/26/2021 CLINICAL DATA:  Chest pain EXAM: CHEST - 2 VIEW COMPARISON:  07/13/2021 FINDINGS: Borderline heart size. Prior CABG. There is no edema, consolidation, effusion, or pneumothorax. IMPRESSION: Stable exam.  No acute finding. Electronically Signed   By: Jorje Guild M.D.   On: 10/26/2021 08:52    EKG: Sinus rhythm with some nonspecific lateral T wave flattening.  Cath 04/2021- Native vessels 100% occlusion of proximal right coronary artery, proximal LAD, proximal obtuse marginal 1 Patent graft to PDA, obtuse marginal 1, and LIMA to LAD Moderate unchanged stenosis of 70% of mid circumflex artery to obtuse marginal 2  ASSESSMENT AND PLAN:  Joshus Rogan is a 76 year old male with history of CAD s/p CABG x3 2005, hypertension, hyperlipidemia, type 2 diabetes, morbid obesity, CKD who presents with chest pain and was discovered to have a mildly elevated troponin.  #CAD s/p CABG x3 #Chest pain, mildly elevated troponin The patient has a history of coronary disease, presents with chest pain beginning while resting overnight.  His troponin is mildly elevated, though his ECG shows no acute ischemic changes. He is still having intermittent chest pain, though he appears quite comfortable.  -Continue aspirin and Plavix as previously prescribed -With heparin infusion while we further evaluate -Continue home antianginals including metoprolol 25 mg twice daily and Imdur 30 mg daily -If blood pressure remains elevated resume home antihypertensives including amlodipine and lisinopril -Continue atorvastatin 80 mg daily - Trend troponin until peak - maintain on telemetry while inpatient.  -Agree with repeating the echocardiogram to assess for new wall motion abnormalities -Determine need for ischemic  evaluation pending the above work-up  #Heart failure with mildly reduced ejection fraction Most recent echo showed an EF of 45 to 50%. BNP is 106.  -Continue metoprolol -If BP allows resume lisinopril  #CKD Creatinine is 1.6 on presentation which seems to be near his baseline.  Signed: Andrez Grime MD 10/26/2021, 1:08 PM

## 2021-10-26 NOTE — ED Notes (Signed)
Pt sitting at edge of bed, given lunch tray

## 2021-10-26 NOTE — Plan of Care (Signed)

## 2021-10-26 NOTE — Assessment & Plan Note (Signed)
Patient presents for evaluation of chest pain at rest with elevated troponin levels. We will obtain serial troponin levels Start patient on a heparin drip if troponin shows an upward trend Continue aspirin, Plavix, metoprolol with holding parameters and atorvastatin Obtain 2D echocardiogram to assess LVEF and rule out regional wall motion abnormality Consult cardiology

## 2021-10-26 NOTE — ED Provider Notes (Signed)
[facility] Provider Note    Event Date/Time   First MD Initiated Contact with Patient 10/26/21 0831     (approximate)   History   No chief complaint on file.   HPI  Justin Keith is a 76 y.o. male past medical history of coronary disease, CKD diabetes hypertension hyperlipidemia who presents with chest pain.  Around 3 AM patient started having left-sided squeezing chest pain.  Radiated down to the abdomen and back up.  Was coming and going for about 30 minutes and has since improved.  He did not have any associated shortness of breath nausea or diaphoresis.  He feels okay now.  He does not typically get chest pain.  He was given 4 baby aspirin by EMS.  Denies lower extremity edema fevers chills cough.    Past Medical History:  Diagnosis Date   CAD (coronary artery disease)    CKD (chronic kidney disease)    Diabetes mellitus without complication (HCC)    GERD (gastroesophageal reflux disease)    Hyperlipemia    Hypertension     Patient Active Problem List   Diagnosis Date Noted   Unstable angina ([place])    Hyperglycemia due to type 2 diabetes mellitus ([place]) 04/13/2021   Stage 3b chronic kidney disease ([place]) 04/13/2021   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   Elevated troponin level    Stage 3a chronic kidney disease (HCC)    Hyponatremia    Cardiomyopathy ([place])    Acute non-ST elevation myocardial infarction (NSTEMI) ([place]) 11/26/2019   NSTEMI (non-ST elevated myocardial infarction) ([place]) 11/25/2019   AKI (acute kidney injury) ([place]) 11/25/2019   Angina pectoris ([place]) 03/15/2019   Problems with swallowing and mastication    Chest pain in adult 10/22/2018   Essential hypertension 09/15/2018   HLD (hyperlipidemia) 09/15/2018   GERD (gastroesophageal reflux disease) 09/15/2018   CAD (coronary artery disease) 09/15/2018   Uncontrolled secondary diabetes mellitus with stage 3 CKD (GFR 30-59) 09/15/2018   Chest pain 12/07/2015     Physical Exam  Triage Vital  Signs: ED Triage Vitals  Enc Vitals Group     BP 10/26/21 0813 105/61     Pulse Rate 10/26/21 0813 (!) 50     Resp 10/26/21 0813 16     Temp 10/26/21 0813 98 F (36.7 C)     Temp Source 10/26/21 0813 Oral     SpO2 10/26/21 0813 96 %     Weight 10/26/21 0820 257 lb (116.6 kg)     Height 10/26/21 0820 '5\' 8"'$  (1.727 m)     Head Circumference --      Peak Flow --      Pain Score 10/26/21 0819 7     Pain Loc --      Pain Edu? --      Excl. in [place]? --     Most recent vital signs: Vitals:   10/26/21 0813  BP: 105/61  Pulse: (!) 50  Resp: 16  Temp: 98 F (36.7 C)  SpO2: 96%     General: Awake, no distress.  CV:  Good peripheral perfusion.  No peripheral edema Resp:  Normal effort.  Lungs are clear Abd:  No distention.  Abdomen is obese but nontender Neuro:             Awake, Alert, Oriented x 3  Other:     ED Results / Procedures / Treatments  Labs (all labs ordered are listed, but only abnormal results are displayed) Labs  Reviewed  COMPREHENSIVE METABOLIC PANEL - Abnormal; Notable for the following components:      Result Value   Sodium 134 (*)    CO2 19 (*)    Glucose, Bld 254 (*)    Creatinine, Ser 1.60 (*)    GFR, Estimated 45 (*)    All other components within normal limits  CBC WITH DIFFERENTIAL/PLATELET - Abnormal; Notable for the following components:   Hemoglobin 12.4 (*)    HCT 38.3 (*)    All other components within normal limits  TROPONIN I (HIGH SENSITIVITY) - Abnormal; Notable for the following components:   Troponin I (High Sensitivity) 94 (*)    All other components within normal limits  LIPASE, BLOOD  BRAIN NATRIURETIC PEPTIDE     EKG  EKG interpreted by myself shows sinus bradycardia with a normal axis some ST sagging in aVL and I but this is similar to prior EKG no  acute ischemic changes, first-degree AV block   RADIOLOGY I reviewed and interpreted the CXR which does not show any acute cardiopulmonary  process    PROCEDURES:  Critical Care performed: No  Procedures  The patient is on the cardiac monitor to evaluate for evidence of arrhythmia and/or significant heart rate changes.   MEDICATIONS ORDERED IN ED: Medications - No data to display   IMPRESSION / MDM / [place] / ED COURSE  I reviewed the triage vital signs and the nursing notes.                              Differential diagnosis includes, but is not limited to, NSTEMI, unstable angina, musculoskeletal, GI related, less likely PE or aortic dissection  The patient is a 76 year old male with cardiac history including prior CABG and stents who presents with an episode of chest pain.  This occurred around 3 AM today was squeezing pressure-like with radiation to the abdomen without associated symptoms.  Was coming and going for about 30 minutes and now this has resolved.  He is pain-free currently.  He has an EKG showing sinus bradycardia with no acute ischemic changes.  Blood pressure is borderline but his vitals are otherwise okay.  He has no abdominal tenderness no peripheral edema lungs are clear.  His troponin is 94.  Patient is hyperglycemic with a sugar of 250 bicarb is 19 but he has no anion gap.  Hemoglobin stable at 12.4. With his cardiac history I am concerned that this was an ischemic event today and will admit to the hospital service for further cardiac work-up.  Given he is pain-free has no dynamic EKG changes will not start heparin.  He already received aspirin with EMS.      FINAL CLINICAL IMPRESSION(S) / ED DIAGNOSES   Final diagnoses:  Chest pain, unspecified type     Rx / DC Orders   ED Discharge Orders     None        Note:  This document was prepared using Dragon voice recognition software and may include unintentional dictation errors.   Rada Hay, MD 10/26/21 [phone]

## 2021-10-26 NOTE — ED Triage Notes (Signed)
BIB [facility] from home for CP. Started around 0300am. He was awake when it started. radiates into abdomen. Denies N/V. Pain 9/10. Squeezing type pain. He took 4 baby ASA prior to EMS. EMS gave 1 SL NTG spray. Post pain 7/10.  Previous HX with 2 stents.  20G R Hand BGL 227

## 2021-10-26 NOTE — Assessment & Plan Note (Signed)
Continue metoprolol with holding parameters, nitrates and lisinopril

## 2021-10-26 NOTE — Assessment & Plan Note (Signed)
Patient has a known history of coronary artery disease status post CABG Continue aspirin, Plavix, atorvastatin, nitrates and metoprolol

## 2021-10-26 NOTE — Assessment & Plan Note (Signed)
Secondary to diabetes mellitus Renal function is stable Monitor closely during this hospitalization

## 2021-10-26 NOTE — Progress Notes (Signed)
ANTICOAGULATION CONSULT NOTE - Initial Consult  Pharmacy Consult for IV heparin Indication: chest pain/ACS/STEMI  No Known Allergies  Patient Measurements: Height: '5\' 8"'$  (172.7 cm) Weight: 116.6 kg (257 lb) IBW/kg (Calculated) : 68.4 Heparin Dosing Weight: 94.8 kg  Vital Signs: Temp: 98 F (36.7 C) (05/27 0813) Temp Source: Oral (05/27 0813) BP: 123/76 (05/27 1030) Pulse Rate: 52 (05/27 1030)  Labs: Recent Labs    10/26/21 0825 10/26/21 1109  HGB 12.4*  --   HCT 38.3*  --   PLT 351  --   CREATININE 1.60*  --   TROPONINIHS 94* 103*   Estimated Creatinine Clearance: 49.5 mL/min (A) (by C-G formula based on SCr of 1.6 mg/dL (H)).  Medical History: Past Medical History:  Diagnosis Date   CAD (coronary artery disease)    CKD (chronic kidney disease)    Diabetes mellitus without complication (HCC)    GERD (gastroesophageal reflux disease)    Hyperlipemia    Hypertension    Medications:  Scheduled:   [START ON 10/27/2021] aspirin  81 mg Oral Daily   [START ON 10/27/2021] atorvastatin  80 mg Oral Daily   [START ON 10/27/2021] clopidogrel  75 mg Oral Daily   insulin aspart  0-20 Units Subcutaneous TID WC   insulin glargine-yfgn  20 Units Subcutaneous QHS   [START ON 10/27/2021] isosorbide mononitrate  30 mg Oral Daily   [START ON 10/27/2021] lisinopril  10 mg Oral Daily   metoprolol tartrate  25 mg Oral BID   [START ON 10/27/2021] pantoprazole  40 mg Oral Daily   Infusions:  PRN: acetaminophen, nitroGLYCERIN, ondansetron (ZOFRAN) IV  Not on PTA anticoagulation per my chart review  Assessment: 76 year old male presenting with left sided chest pain. PMH coronary artery disease status post CABG, diabetes mellitus with complications of stage IIIa chronic kidney disease, hypertension, dyslipidemia, morbid obesity. Initial troponin elevated.   Baseline labs: Hgb, Plts WNL.Ordered aPTT, PT-INR, however heparin started prior to labs drawn due to pt status.  Goal of Therapy:   Heparin level 0.3-0.7 units/ml Monitor platelets by anticoagulation protocol: Yes   Plan:  Give 4,000 units bolus x 1 Start heparin infusion at 1250 units/hr Check anti-Xa level in 8 hours and daily while on heparin Continue to monitor H&H and platelets  Wynelle Cleveland, PharmD Pharmacy Resident  10/26/2021 12:59 PM

## 2021-10-26 NOTE — Assessment & Plan Note (Signed)
Patient has type 2 diabetes mellitus with hyperglycemia Optimize glycemic control Maintain consistent carbohydrate diet Sliding scale insulin

## 2021-10-27 DIAGNOSIS — I249 Acute ischemic heart disease, unspecified: Secondary | ICD-10-CM | POA: Diagnosis not present

## 2021-10-27 LAB — BASIC METABOLIC PANEL
Anion gap: 7 (ref 5–15)
BUN: 23 mg/dL (ref 8–23)
CO2: 24 mmol/L (ref 22–32)
Calcium: 9.6 mg/dL (ref 8.9–10.3)
Chloride: 105 mmol/L (ref 98–111)
Creatinine, Ser: 1.48 mg/dL — ABNORMAL HIGH (ref 0.61–1.24)
GFR, Estimated: 49 mL/min — ABNORMAL LOW (ref >60)
Glucose, Bld: 158 mg/dL — ABNORMAL HIGH (ref 70–99)
Potassium: 4.4 mmol/L (ref 3.5–5.1)
Sodium: 136 mmol/L (ref 135–145)

## 2021-10-27 LAB — GLUCOSE, CAPILLARY
Glucose-Capillary: 155 mg/dL — ABNORMAL HIGH (ref 70–99)
Glucose-Capillary: 201 mg/dL — ABNORMAL HIGH (ref 70–99)
Glucose-Capillary: 160 mg/dL — ABNORMAL HIGH (ref 70–99)
Glucose-Capillary: 140 mg/dL — ABNORMAL HIGH (ref 70–99)

## 2021-10-27 LAB — CBC
HCT: 39.9 % (ref 39.0–52.0)
Hemoglobin: 12.9 g/dL — ABNORMAL LOW (ref 13.0–17.0)
MCH: 28.4 pg (ref 26.0–34.0)
MCHC: 32.3 g/dL (ref 30.0–36.0)
MCV: 87.9 fL (ref 80.0–100.0)
Platelets: 348 10*3/uL (ref 150–400)
RBC: 4.54 MIL/uL (ref 4.22–5.81)
RDW: 14.6 % (ref 11.5–15.5)
WBC: 8.8 10*3/uL (ref 4.0–10.5)
nRBC: 0 % (ref 0.0–0.2)

## 2021-10-27 LAB — LIPID PANEL
Cholesterol: 334 mg/dL — ABNORMAL HIGH (ref 0–200)
HDL: 43 mg/dL (ref >40)
LDL Cholesterol: 236 mg/dL — ABNORMAL HIGH (ref 0–99)
Total CHOL/HDL Ratio: 7.8 RATIO
Triglycerides: 273 mg/dL — ABNORMAL HIGH (ref <150)
VLDL: 55 mg/dL — ABNORMAL HIGH (ref 0–40)

## 2021-10-27 LAB — HEPARIN LEVEL (UNFRACTIONATED): Heparin Unfractionated: 0.58 IU/mL (ref 0.30–0.70)

## 2021-10-27 MED ORDER — ISOSORBIDE MONONITRATE ER 60 MG PO TB24
60.0000 mg | ORAL_TABLET | Freq: Every day | ORAL | Status: DC
  Administered 2021-10-28 – 2021-10-29 (×2): 60 mg via ORAL
  Filled 2021-10-27 (×2): qty 1

## 2021-10-27 NOTE — Progress Notes (Signed)
ANTICOAGULATION CONSULT NOTE - Initial Consult  Pharmacy Consult for IV heparin Indication: chest pain/ACS/STEMI  No Known Allergies  Patient Measurements: Height: '5\' 8"'$  (172.7 cm) Weight: 116.6 kg (257 lb) IBW/kg (Calculated) : 68.4 Heparin Dosing Weight: 94.8 kg  Vital Signs: Temp: 97.8 F (36.6 C) (05/28 0415) Temp Source: Oral (05/27 1824) BP: 91/73 (05/28 0415) Pulse Rate: 57 (05/28 0415)  Labs: Recent Labs    10/26/21 0825 10/26/21 1109 10/26/21 1433 10/26/21 1650 10/26/21 2133 10/27/21 0331  HGB 12.4*  --   --   --   --  12.9*  HCT 38.3*  --   --   --   --  39.9  PLT 351  --   --   --   --  348  APTT  --   --  168*  --   --   --   LABPROT  --   --  14.1  --   --   --   INR  --   --  1.1  --   --   --   HEPARINUNFRC  --   --   --   --  0.46 0.58  CREATININE 1.60*  --   --   --   --  1.48*  TROPONINIHS 94* 103*  --  99*  --   --     Estimated Creatinine Clearance: 53.5 mL/min (A) (by C-G formula based on SCr of 1.48 mg/dL (H)).  Medical History: Past Medical History:  Diagnosis Date   CAD (coronary artery disease)    CKD (chronic kidney disease)    Diabetes mellitus without complication (HCC)    GERD (gastroesophageal reflux disease)    Hyperlipemia    Hypertension    Medications:  Scheduled:   aspirin  81 mg Oral Daily   atorvastatin  80 mg Oral Daily   clopidogrel  75 mg Oral Daily   insulin aspart  0-20 Units Subcutaneous TID WC   insulin glargine-yfgn  20 Units Subcutaneous QHS   isosorbide mononitrate  30 mg Oral Daily   lisinopril  10 mg Oral Daily   metoprolol tartrate  25 mg Oral BID   pantoprazole  40 mg Oral Daily   Infusions:   heparin 1,250 Units/hr (10/26/21 1337)   PRN: acetaminophen, nitroGLYCERIN, ondansetron (ZOFRAN) IV  Not on PTA anticoagulation per my chart review  Assessment: 76 year old male presenting with left sided chest pain. PMH coronary artery disease status post CABG, diabetes mellitus with complications of  stage IIIa chronic kidney disease, hypertension, dyslipidemia, morbid obesity. Initial troponin elevated.   Baseline labs: Hgb, Plts WNL.Ordered aPTT, PT-INR, however heparin started prior to labs drawn due to pt status.  Goal of Therapy:  Heparin level 0.3-0.7 units/ml Monitor platelets by anticoagulation protocol: Yes   Plan:  5/28:  HL @ 0331 = 0.58, therapeutic X 2  Will continue pt on current rate and recheck HL on 5/29 with AM labs.   Billi Bright D, PharmD 10/27/2021 5:40 AM

## 2021-10-27 NOTE — Progress Notes (Signed)
PROGRESS NOTE    Justin Keith  OEU:[id number] DOB: 1945/10/19 DOA: 10/26/2021 PCP: [facility] [place] [facility]    Brief Narrative:  Justin Keith is a 76 y.o. male with medical history significant for coronary artery disease status post CABG, diabetes mellitus with complications of stage IIIa chronic kidney disease, hypertension, dyslipidemia, morbid obesity who presents to the ER for evaluation of left sided chest pain which started about 3 AM at rest.  He described the pain as a squeezing sensation radiating down his abdomen lasting about 30 minutes.  He took a baby aspirin without any significant improvement in his symptoms and so he called EMS.  TP elevated, was started on heparin gtt, cardiology was consulted.    Consultants:  Cardiology  Procedures:   Antimicrobials:      Subjective: Denies shortness of breath or chest pain this AM  Objective: Vitals:   10/27/21 0022 10/27/21 0415 10/27/21 0828 10/27/21 1133  BP: (!) 143/64 91/73 140/86 118/77  Pulse:  (!) 57 (!) 51 (!) 55  Resp: 16 15    Temp: 97.6 F (36.4 C) 97.8 F (36.6 C) 98.2 F (36.8 C) 98.2 F (36.8 C)  TempSrc:   Oral   SpO2:  96% 96% 98%  Weight:      Height:        Intake/Output Summary (Last 24 hours) at 10/27/2021 1228 Last data filed at 10/27/2021 1100 Gross per 24 hour  Intake 664.5 ml  Output 1425 ml  Net -760.5 ml   Filed Weights   10/26/21 0820  Weight: 116.6 kg    Examination: Calm, NAD Cta no w/r Reg s1/s2 no gallop Soft benign +bs No edema Aaoxox3  Mood and affect appropriate in current setting     Data Reviewed: I have personally reviewed following labs and imaging studies  CBC: Recent Labs  Lab 10/26/21 0825 10/27/21 0331  WBC 7.2 8.8  NEUTROABS 5.1  --   HGB 12.4* 12.9*  HCT 38.3* 39.9  MCV 87.2 87.9  PLT 351 154   Basic Metabolic Panel: Recent Labs  Lab 10/26/21 0825 10/27/21 0331  NA 134* 136  K 3.8 4.4  CL 104 105  CO2 19* 24   GLUCOSE 254* 158*  BUN 20 23  CREATININE 1.60* 1.48*  CALCIUM 9.3 9.6   GFR: Estimated Creatinine Clearance: 53.5 mL/min (A) (by C-G formula based on SCr of 1.48 mg/dL (H)). Liver Function Tests: Recent Labs  Lab 10/26/21 0825  AST 23  ALT 22  ALKPHOS 64  BILITOT 0.7  PROT 7.7  ALBUMIN 3.5   Recent Labs  Lab 10/26/21 0825  LIPASE 32   No results for input(s): AMMONIA in the last 168 hours. Coagulation Profile: Recent Labs  Lab 10/26/21 1433  INR 1.1   Cardiac Enzymes: No results for input(s): CKTOTAL, CKMB, CKMBINDEX, TROPONINI in the last 168 hours. BNP (last 3 results) No results for input(s): PROBNP in the last 8760 hours. HbA1C: Recent Labs    10/26/21 1650  HGBA1C 11.3*   CBG: Recent Labs  Lab 10/26/21 1744 10/26/21 2134 10/27/21 0804 10/27/21 1141  GLUCAP 198* 146* 155* 201*   Lipid Profile: Recent Labs    10/27/21 0331  CHOL 334*  HDL 43  LDLCALC 236*  TRIG 273*  CHOLHDL 7.8   Thyroid Function Tests: No results for input(s): TSH, T4TOTAL, FREET4, T3FREE, THYROIDAB in the last 72 hours. Anemia Panel: No results for input(s): VITAMINB12, FOLATE, FERRITIN, TIBC, IRON, RETICCTPCT in the last 72  hours. Sepsis Labs: No results for input(s): PROCALCITON, LATICACIDVEN in the last 168 hours.  No results found for this or any previous visit (from the past 240 hour(s)).       Radiology Studies: DG Chest 2 View  Result Date: 10/26/2021 CLINICAL DATA:  Chest pain EXAM: CHEST - 2 VIEW COMPARISON:  07/13/2021 FINDINGS: Borderline heart size. Prior CABG. There is no edema, consolidation, effusion, or pneumothorax. IMPRESSION: Stable exam.  No acute finding. Electronically Signed   By: Jorje Guild M.D.   On: 10/26/2021 08:52        Scheduled Meds:  aspirin  81 mg Oral Daily   atorvastatin  80 mg Oral Daily   clopidogrel  75 mg Oral Daily   insulin aspart  0-20 Units Subcutaneous TID WC   insulin glargine-yfgn  20 Units Subcutaneous QHS    [START ON 10/28/2021] isosorbide mononitrate  60 mg Oral Daily   lisinopril  10 mg Oral Daily   metoprolol tartrate  25 mg Oral BID   pantoprazole  40 mg Oral Daily   Continuous Infusions:  heparin 1,250 Units/hr (10/27/21 0643)    Assessment & Plan:   Principal Problem:   ACS (acute coronary syndrome) ([place]) Active Problems:   Essential hypertension   CAD (coronary artery disease)   Hyperglycemia due to type 2 diabetes mellitus (HCC)   Stage 3a chronic kidney disease (HCC)   Obesity, Class III, BMI 40-49.9 (morbid obesity) ([place])   ACS (acute coronary syndrome) [facility]) Cardiology following Echo pending On heparin gtt x 48 hrs Continue aspirin, metoprolol, Plavix Continue statins Imdur increased to 60 mg daily   CAD (coronary artery disease) History of CABG  Continue aspirin, Plavix, statin nitrates and metoprolol    Dyslipidemia Lipid panel not well controlled LDL goal <70 Patient reports he was not taking his statin for about a year because he was told to take it at night and he could not remember.  Previously he was taking it during the daytime.  Told patient to take it whenever he is able to if it works for him better during the day he should be continuing to do that.   Discussed importance of controlling his lipid panel especially in the setting of history of CAD     Stage 3a chronic kidney disease ([place]) Secondary to diabetes mellitus 5/28 renal function at baseline monitor periodically       Obesity, Class III, BMI 40-49.9 (morbid obesity) ([place]) Patient has a BMI of 67.54 Complicates overall prognosis and care Lifestyle modification and exercise has been discussed with him in detail      Hyperglycemia due to type 2 diabetes mellitus ([place]) Patient has type 2 diabetes mellitus with hyperglycemia Optimize glycemic control Maintain consistent carbohydrate diet Sliding scale insulin      Essential hypertension Continue metoprolol with holding  parameters, nitrates and lisinopril   DVT prophylaxis: Heparin drip Code Status: Full Family Communication: None at bedside Disposition Plan: Back home Status is: Inpatient Remains inpatient appropriate because: IV treatment, echo pending      LOS: 1 day   Time spent: 35 minutes    Nolberto Hanlon, MD [facility] Pager [phone]  If 7PM-7AM, please contact night-coverage 10/27/2021, 12:28 PM

## 2021-10-27 NOTE — Consult Note (Signed)
[facility] Cardiology  CARDIOLOGY CONSULT NOTE  Patient ID: Justin Keith MRN: [id number] DOB/AGE: 75-05-1946 75 y.o.  Admit date: 10/26/2021 Referring Physician [place], [place] Primary Cardiologist [facility] Reason for Consultation h/o CABG, chest pain, elevated troponin  HPI:  Justin Keith is a 76 year old male with history of CAD s/p CABG x3 2005, hypertension, hyperlipidemia, type 2 diabetes, morbid obesity, CKD who presents with chest pain and was discovered to have a mildly elevated troponin.  Interval history: - No acute events overnight.  - Feels ok this morning, with no significant complaints of chest pain.   Review of systems complete and found to be negative unless listed above     Past Medical History:  Diagnosis Date   CAD (coronary artery disease)    CKD (chronic kidney disease)    Diabetes mellitus without complication (HCC)    GERD (gastroesophageal reflux disease)    Hyperlipemia    Hypertension     Past Surgical History:  Procedure Laterality Date   CARDIAC CATHETERIZATION     CARDIAC SURGERY     COLONOSCOPY WITH PROPOFOL N/A 02/25/2018   Procedure: COLONOSCOPY WITH PROPOFOL;  Surgeon: Jonathon Bellows, MD;  Location: [facility] ENDOSCOPY;  Service: Gastroenterology;  Laterality: N/A;   COLONOSCOPY WITH PROPOFOL N/A 02/26/2018   Procedure: COLONOSCOPY WITH PROPOFOL;  Surgeon: Jonathon Bellows, MD;  Location: [facility] ENDOSCOPY;  Service: Gastroenterology;  Laterality: N/A;   ESOPHAGOGASTRODUODENOSCOPY (EGD) WITH PROPOFOL N/A 01/18/2019   Procedure: ESOPHAGOGASTRODUODENOSCOPY (EGD) WITH PROPOFOL;  Surgeon: Lucilla Lame, MD;  Location: [facility] ENDOSCOPY;  Service: Endoscopy;  Laterality: N/A;   FOREIGN BODY REMOVAL N/A 03/07/2018   Procedure: FOREIGN BODY REMOVAL;  Surgeon: Jonathon Bellows, MD;  Location: [facility] ENDOSCOPY;  Service: Gastroenterology;  Laterality: N/A;   LEFT HEART CATH AND CORS/GRAFTS ANGIOGRAPHY N/A 08/07/2017   Procedure:  LEFT HEART CATH AND CORS/GRAFTS ANGIOGRAPHY;  Surgeon: Yolonda Kida, MD;  Location: [place] CV LAB;  Service: Cardiovascular;  Laterality: N/A;   LEFT HEART CATH AND CORS/GRAFTS ANGIOGRAPHY N/A 11/28/2019   Procedure: LEFT HEART CATH AND CORS/GRAFTS ANGIOGRAPHY;  Surgeon: Teodoro Spray, MD;  Location: [place] CV LAB;  Service: Cardiovascular;  Laterality: N/A;   LEFT HEART CATH AND CORS/GRAFTS ANGIOGRAPHY N/A 04/15/2021   Procedure: LEFT HEART CATH AND CORS/GRAFTS ANGIOGRAPHY;  Surgeon: Corey Skains, MD;  Location: [place] CV LAB;  Service: Cardiovascular;  Laterality: N/A;   PROSTATE SURGERY     SHOULDER ARTHROSCOPY      Medications Prior to Admission  Medication Sig Dispense Refill Last Dose   amLODipine (NORVASC) 5 MG tablet Take 1 tablet (5 mg total) by mouth daily. 30 tablet 0 10/26/2021   aspirin 81 MG chewable tablet Chew 1 tablet (81 mg total) by mouth daily. 30 tablet 0 10/26/2021   atorvastatin (LIPITOR) 80 MG tablet Take 80 mg by mouth daily.    10/26/2021   clopidogrel (PLAVIX) 75 MG tablet Take 1 tablet (75 mg total) by mouth daily. 30 tablet 0 10/26/2021   FARXIGA 10 MG TABS tablet Take 10 mg by mouth daily.   10/26/2021   HUMALOG KWIKPEN 100 UNIT/ML KwikPen    10/26/2021   insulin aspart (NOVOLOG) 100 UNIT/ML FlexPen 4 units subcutaneous injection three times a day prior to meals (okay to substitute any generic short acting insulin that insurance covers) 15 mL 11 10/26/2021   insulin glargine (LANTUS) 100 UNIT/ML Solostar Pen Inject 20 Units into the skin at bedtime. 15 mL 0 10/25/2021  isosorbide mononitrate (IMDUR) 30 MG 24 hr tablet Take 1 tablet (30 mg total) by mouth daily. 30 tablet 0 10/26/2021   ketorolac (ACULAR) 0.5 % ophthalmic solution SMARTSIG:In Eye(s)   Past Week   lisinopril (ZESTRIL) 10 MG tablet Take 1 tablet by mouth daily.   10/26/2021   metoprolol tartrate (LOPRESSOR) 25 MG tablet Take 1 tablet (25 mg total) by mouth 2 (two) times daily.  60 tablet 0 10/26/2021   pantoprazole (PROTONIX) 40 MG tablet Take 1 tablet (40 mg total) by mouth daily. 30 tablet 1 10/26/2021   blood glucose meter kit and supplies Dispense based on patient and insurance preference. Use up to four times daily as directed. (FOR ICD-10 E10.9, E11.9). 1 each 0    Insulin Pen Needle 32G X 4 MM MISC 1 application by Does not apply route at bedtime. 100 each 0    lisinopril (ZESTRIL) 5 MG tablet Take 1 tablet (5 mg total) by mouth daily. (Patient not taking: Reported on 10/26/2021) 30 tablet 0 Not Taking   nitroGLYCERIN (NITROSTAT) 0.4 MG SL tablet Place 1 tablet (0.4 mg total) under the tongue every 5 (five) minutes as needed for chest pain. 30 tablet 0 prn    Social History   Socioeconomic History   Marital status: Single    Spouse name: Not on file   Number of children: Not on file   Years of education: Not on file   Highest education level: Not on file  Occupational History   Not on file  Tobacco Use   Smoking status: Never   Smokeless tobacco: Current    Types: Chew  Vaping Use   Vaping Use: Never used  Substance and Sexual Activity   Alcohol use: No   Drug use: Not Currently   Sexual activity: Not on file  Other Topics Concern   Not on file  Social History Narrative   Not on file   Social Determinants of Health   Financial Resource Strain: Not on file  Food Insecurity: Not on file  Transportation Needs: Not on file  Physical Activity: Not on file  Stress: Not on file  Social Connections: Not on file  Intimate Partner Violence: Not on file    History reviewed. No pertinent family history.    Review of systems complete and found to be negative unless listed above      PHYSICAL EXAM  General: Well developed, well nourished, in no acute distress HEENT:  Normocephalic and atramatic Neck:  No JVD.  Lungs: Clear bilaterally to auscultation and percussion. Heart: HRRR . Normal S1 and S2 without gallops or murmurs.  Abdomen: Bowel  sounds are positive, abdomen soft and non-tender  Msk:  Back normal, normal gait. Normal strength and tone for age. Extremities: No clubbing, cyanosis or edema.   Neuro: Alert and oriented X 3. Psych:  Good affect, responds appropriately  Labs:   Lab Results  Component Value Date   WBC 8.8 10/27/2021   HGB 12.9 (L) 10/27/2021   HCT 39.9 10/27/2021   MCV 87.9 10/27/2021   PLT 348 10/27/2021    Recent Labs  Lab 10/26/21 0825 10/27/21 0331  NA 134* 136  K 3.8 4.4  CL 104 105  CO2 19* 24  BUN 20 23  CREATININE 1.60* 1.48*  CALCIUM 9.3 9.6  PROT 7.7  --   BILITOT 0.7  --   ALKPHOS 64  --   ALT 22  --   AST 23  --   GLUCOSE 254*  158*    Lab Results  Component Value Date   CKTOTAL 226 07/25/2017   CKMB 4.8 (H) 09/19/2011   TROPONINI <0.03 10/22/2018     Lab Results  Component Value Date   CHOL 334 (H) 10/27/2021   CHOL 319 (H) 04/13/2021   CHOL 166 11/26/2019   Lab Results  Component Value Date   HDL 43 10/27/2021   HDL 37 (L) 04/13/2021   HDL 40 (L) 11/26/2019   Lab Results  Component Value Date   LDLCALC 236 (H) 10/27/2021   LDLCALC 218 (H) 04/13/2021   LDLCALC 89 11/26/2019   Lab Results  Component Value Date   TRIG 273 (H) 10/27/2021   TRIG 320 (H) 04/13/2021   TRIG 183 (H) 11/26/2019   Lab Results  Component Value Date   CHOLHDL 7.8 10/27/2021   CHOLHDL 8.6 04/13/2021   CHOLHDL 4.2 11/26/2019   No results found for: LDLDIRECT    Radiology: DG Chest 2 View  Result Date: 10/26/2021 CLINICAL DATA:  Chest pain EXAM: CHEST - 2 VIEW COMPARISON:  07/13/2021 FINDINGS: Borderline heart size. Prior CABG. There is no edema, consolidation, effusion, or pneumothorax. IMPRESSION: Stable exam.  No acute finding. Electronically Signed   By: Jorje Guild M.D.   On: 10/26/2021 08:52    EKG: Sinus rhythm with some nonspecific lateral T wave flattening.  Cath 04/2021- Native vessels 100% occlusion of proximal right coronary artery, proximal LAD, proximal  obtuse marginal 1 Patent graft to PDA, obtuse marginal 1, and LIMA to LAD Moderate unchanged stenosis of 70% of mid circumflex artery to obtuse marginal 2  ASSESSMENT AND PLAN:  Justin Keith is a 76 year old male with history of CAD s/p CABG x3 2005, hypertension, hyperlipidemia, type 2 diabetes, morbid obesity, CKD who presents with chest pain and was discovered to have a mildly elevated troponin.  #CAD s/p CABG x3 #Chest pain, mildly elevated troponin The patient has a history of coronary disease, presents with chest pain beginning while resting overnight.  His troponin is mildly elevated (peak 100), though his ECG shows no acute ischemic changes. He is still having intermittent chest pain, though he appears quite comfortable.  -Continue aspirin and Plavix as previously prescribed -Heparin infusion x 48 hrs -Continue metoprolol 25 mg twice daily  - Increase imdur to 60 mg daily -If blood pressure remains elevated resume home antihypertensives including amlodipine and lisinopril -Continue atorvastatin 80 mg daily. Reportedly was not taking prior to presentation.  - maintain on telemetry while inpatient.  -Agree with repeating the echocardiogram to assess for new wall motion abnormalities -Determine need for ischemic evaluation pending the above work-up, but favor medical management.   #Heart failure with mildly reduced ejection fraction Most recent echo showed an EF of 45 to 50%. BNP is 106.  -Continue metoprolol -If BP allows resume lisinopril  #CKD Creatinine is 1.6 on presentation which seems to be near his baseline.  Signed: Andrez Grime MD 10/27/2021, 8:20 AM

## 2021-10-27 NOTE — Plan of Care (Signed)

## 2021-10-28 ENCOUNTER — Inpatient Hospital Stay
Admit: 2021-10-28 | Discharge: 2021-10-28 | Disposition: A | Discharge status: Home / Self Care | Payer: Medicare Other | Attending: Internal Medicine | Admitting: Internal Medicine

## 2021-10-28 DIAGNOSIS — I249 Acute ischemic heart disease, unspecified: Secondary | ICD-10-CM | POA: Diagnosis not present

## 2021-10-28 LAB — ECHOCARDIOGRAM COMPLETE
AR max vel: 1.91 cm2
AV Area VTI: 2.26 cm2
AV Area mean vel: 1.93 cm2
AV Mean grad: 4 mmHg
AV Peak grad: 7.1 mmHg
Ao pk vel: 1.33 m/s
Area-P 1/2: 3.36 cm2
Height: 68 in
MV VTI: 1.77 cm2
S' Lateral: 2.55 cm
Weight: 4112 oz

## 2021-10-28 LAB — GLUCOSE, CAPILLARY
Glucose-Capillary: 191 mg/dL — ABNORMAL HIGH (ref 70–99)
Glucose-Capillary: 170 mg/dL — ABNORMAL HIGH (ref 70–99)
Glucose-Capillary: 134 mg/dL — ABNORMAL HIGH (ref 70–99)
Glucose-Capillary: 169 mg/dL — ABNORMAL HIGH (ref 70–99)

## 2021-10-28 LAB — HEPARIN LEVEL (UNFRACTIONATED): Heparin Unfractionated: 0.53 IU/mL (ref 0.30–0.70)

## 2021-10-28 MED ORDER — SODIUM CHLORIDE 0.9% FLUSH
3.0000 mL | Freq: Two times a day (BID) | INTRAVENOUS | Status: DC
Start: 2021-10-28 — End: 2021-10-29
  Administered 2021-10-28 – 2021-10-29 (×2): 3 mL via INTRAVENOUS

## 2021-10-28 MED ORDER — ENOXAPARIN SODIUM 60 MG/0.6ML IJ SOSY
0.5000 mg/kg | PREFILLED_SYRINGE | INTRAMUSCULAR | Status: DC
  Administered 2021-10-28: 57.5 mg via SUBCUTANEOUS
  Filled 2021-10-28: qty 0.6

## 2021-10-28 MED ORDER — PERFLUTREN LIPID MICROSPHERE
1.0000 mL | INTRAVENOUS | Status: AC | PRN
  Administered 2021-10-28: 3 mL via INTRAVENOUS

## 2021-10-28 NOTE — TOC Initial Note (Signed)
Transition of Care [facility]) - Initial/Assessment Note    Patient Details  Name: Justin Keith MRN: [id number] Date of Birth: [birth year]  Transition of Care [facility]) CM/SW Contact:    Laurena Slimmer, RN Phone Number: 10/28/2021, 10:17 AM  Clinical Narrative:                  Transition of Care [facility]) Screening Note   Patient Details  Name: Justin Keith Date of Birth: 03/28/1946   Transition of Care [facility]) CM/SW Contact:    Laurena Slimmer, RN Phone Number: 10/28/2021, 10:17 AM    Transition of Care Department [facility]) has reviewed patient and no TOC needs have been identified at this time. We will continue to monitor patient advancement through interdisciplinary progression rounds. If new patient transition needs arise, please place a TOC consult.          Patient Goals and CMS Choice        Expected Discharge Plan and Services                                                Prior Living Arrangements/Services                       Activities of Daily Living Home Assistive Devices/Equipment: None ADL Screening (condition at time of admission) Patient's cognitive ability adequate to safely complete daily activities?: Yes Is the patient deaf or have difficulty hearing?: No Does the patient have difficulty seeing, even when wearing glasses/contacts?: No Does the patient have difficulty concentrating, remembering, or making decisions?: No Patient able to express need for assistance with ADLs?: Yes Does the patient have difficulty dressing or bathing?: No Independently performs ADLs?: Yes (appropriate for developmental age) Does the patient have difficulty walking or climbing stairs?: No Weakness of Legs: None Weakness of Arms/Hands: None  Permission Sought/Granted                  Emotional Assessment              Admission diagnosis:  ACS (acute coronary syndrome) ([place]) [I24.9] Chest pain, unspecified type [R07.9] Patient Active  Problem List   Diagnosis Date Noted   ACS (acute coronary syndrome) ([place]) 10/26/2021   Obesity, Class III, BMI 40-49.9 (morbid obesity) ([place]) 10/26/2021   Unstable angina ([place])    Hyperglycemia due to type 2 diabetes mellitus ([place]) 04/13/2021   Stage 3b chronic kidney disease ([place]) 04/13/2021   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   Elevated troponin level    Stage 3a chronic kidney disease ([place])    Hyponatremia    Cardiomyopathy ([place])    Acute non-ST elevation myocardial infarction (NSTEMI) ([place]) 11/26/2019   NSTEMI (non-ST elevated myocardial infarction) ([place]) 11/25/2019   AKI (acute kidney injury) ([place]) 11/25/2019   Angina pectoris ([place]) 03/15/2019   Problems with swallowing and mastication    Chest pain in adult 10/22/2018   Essential hypertension 09/15/2018   HLD (hyperlipidemia) 09/15/2018   GERD (gastroesophageal reflux disease) 09/15/2018   CAD (coronary artery disease) 09/15/2018   Uncontrolled secondary diabetes mellitus with stage 3 CKD (GFR 30-59) 09/15/2018   Chest pain 12/07/2015   PCP:  [facility], [place]:   [place], [address] [place] [address]  Phone: [phone] Fax: [phone]     Social Determinants of Health (SDOH) Interventions    Readmission Risk Interventions     View : No data to display.

## 2021-10-28 NOTE — Progress Notes (Signed)
PROGRESS NOTE    Justin Keith  SPQ:[id number] DOB: [birth year] DOA: 10/26/2021 PCP: [facility] [place]    Brief Narrative:  Justin Keith is a 76 y.o. male with medical history significant for coronary artery disease status post CABG, diabetes mellitus with complications of stage IIIa chronic kidney disease, hypertension, dyslipidemia, morbid obesity who presents to the ER for evaluation of left sided chest pain which started about 3 AM at rest.  He described the pain as a squeezing sensation radiating down his abdomen lasting about 30 minutes.  He took a baby aspirin without any significant improvement in his symptoms and so he called EMS.  TP elevated, was started on heparin gtt, cardiology was consulted. 5/29 no overnight issues    Consultants:  Cardiology  Procedures:   Antimicrobials:      Subjective: Not much pain or sob this am  Objective: Vitals:   10/27/21 1603 10/27/21 1950 10/27/21 2356 10/28/21 0332  BP: 108/69 111/63 106/61 111/62  Pulse: (!) 55 (!) 56 (!) 56 (!) 54  Resp:  '18 16 16  '$ Temp: 97.7 F (36.5 C) 98.2 F (36.8 C) 97.7 F (36.5 C) 98.1 F (36.7 C)  TempSrc: Oral Oral Oral   SpO2: 96% 94% 96% 95%  Weight:      Height:        Intake/Output Summary (Last 24 hours) at 10/28/2021 3335 Last data filed at 10/28/2021 0700 Gross per 24 hour  Intake 1139.82 ml  Output 1275 ml  Net -135.18 ml   Filed Weights   10/26/21 0820  Weight: 116.6 kg    Examination: Calm, NAD Cta no w/r Reg s1/s2 no gallop Soft benign +bs No edema Aaoxox3  Mood and affect appropriate in current setting     Data Reviewed: I have personally reviewed following labs and imaging studies  CBC: Recent Labs  Lab 10/26/21 0825 10/27/21 0331  WBC 7.2 8.8  NEUTROABS 5.1  --   HGB 12.4* 12.9*  HCT 38.3* 39.9  MCV 87.2 87.9  PLT 351 456   Basic Metabolic Panel: Recent Labs  Lab 10/26/21 0825 10/27/21 0331  NA 134* 136  K 3.8 4.4  CL  104 105  CO2 19* 24  GLUCOSE 254* 158*  BUN 20 23  CREATININE 1.60* 1.48*  CALCIUM 9.3 9.6   GFR: Estimated Creatinine Clearance: 53.5 mL/min (A) (by C-G formula based on SCr of 1.48 mg/dL (H)). Liver Function Tests: Recent Labs  Lab 10/26/21 0825  AST 23  ALT 22  ALKPHOS 64  BILITOT 0.7  PROT 7.7  ALBUMIN 3.5   Recent Labs  Lab 10/26/21 0825  LIPASE 32   No results for input(s): AMMONIA in the last 168 hours. Coagulation Profile: Recent Labs  Lab 10/26/21 1433  INR 1.1   Cardiac Enzymes: No results for input(s): CKTOTAL, CKMB, CKMBINDEX, TROPONINI in the last 168 hours. BNP (last 3 results) No results for input(s): PROBNP in the last 8760 hours. HbA1C: Recent Labs    10/26/21 1650  HGBA1C 11.3*   CBG: Recent Labs  Lab 10/26/21 2134 10/27/21 0804 10/27/21 1141 10/27/21 1635 10/27/21 2049  GLUCAP 146* 155* 201* 160* 140*   Lipid Profile: Recent Labs    10/27/21 0331  CHOL 334*  HDL 43  LDLCALC 236*  TRIG 273*  CHOLHDL 7.8   Thyroid Function Tests: No results for input(s): TSH, T4TOTAL, FREET4, T3FREE, THYROIDAB in the last 72 hours. Anemia Panel: No results for input(s): VITAMINB12, FOLATE, FERRITIN, TIBC, IRON,  RETICCTPCT in the last 72 hours. Sepsis Labs: No results for input(s): PROCALCITON, LATICACIDVEN in the last 168 hours.  No results found for this or any previous visit (from the past 240 hour(s)).       Radiology Studies: DG Chest 2 View  Result Date: 10/26/2021 CLINICAL DATA:  Chest pain EXAM: CHEST - 2 VIEW COMPARISON:  07/13/2021 FINDINGS: Borderline heart size. Prior CABG. There is no edema, consolidation, effusion, or pneumothorax. IMPRESSION: Stable exam.  No acute finding. Electronically Signed   By: Jorje Guild M.D.   On: 10/26/2021 08:52        Scheduled Meds:  aspirin  81 mg Oral Daily   atorvastatin  80 mg Oral Daily   clopidogrel  75 mg Oral Daily   insulin aspart  0-20 Units Subcutaneous TID WC   insulin  glargine-yfgn  20 Units Subcutaneous QHS   isosorbide mononitrate  60 mg Oral Daily   lisinopril  10 mg Oral Daily   metoprolol tartrate  25 mg Oral BID   pantoprazole  40 mg Oral Daily   Continuous Infusions:  heparin 1,250 Units/hr (10/28/21 0325)    Assessment & Plan:   Principal Problem:   ACS (acute coronary syndrome) ([place]) Active Problems:   Essential hypertension   CAD (coronary artery disease)   Hyperglycemia due to type 2 diabetes mellitus (HCC)   Stage 3a chronic kidney disease (HCC)   Obesity, Class III, BMI 40-49.9 (morbid obesity) ([place])   ACS (acute coronary syndrome) [facility]) Cardiology following Echo pending On heparin gtt x 48 hrs Continue aspirin, metoprolol, Plavix Continue statins Imdur increased to 60 mg daily 5/29 Lexiscan Myoview in a.m.   CAD (coronary artery disease) History of CABG  5/29 continue aspirin, Plavix, statin, nitrates and metoprolol      Dyslipidemia Lipid panel not well controlled LDL goal <70 Patient reports he was not taking his statin for about a year because he was told to take it at night and he could not remember.  Previously he was taking it during the daytime.  Told patient to take it whenever he is able to if it works for him better during the day he should be continuing to do that.   Discussed importance of controlling his lipid panel especially in the setting of history of CAD 5/29 statin resumed     Stage 3a chronic kidney disease ([place]) Secondary to diabetes mellitus 5/29 renal function at baseline          Obesity, Class III, BMI 40-49.9 (morbid obesity) ([place]) Patient has a BMI of 32.67 Complicates overall prognosis and care Lifestyle modification and exercise has been discussed with him in detail      Hyperglycemia due to type 2 diabetes mellitus ([place]) Patient has type 2 diabetes mellitus with hyperglycemia Optimize glycemic control Maintain consistent carbohydrate diet Sliding scale insulin       Essential hypertension Continue metoprolol with holding parameters, nitrates and lisinopril   DVT prophylaxis: Heparin drip Code Status: Full Family Communication: None at bedside Disposition Plan: Back home Status is: Inpatient Remains inpatient appropriate because: IV treatment, echo pending, stress test in a.m.      LOS: 2 days   Time spent: 35 minutes    Nolberto Hanlon, MD [facility] Pager [phone]  If 7PM-7AM, please contact night-coverage 10/28/2021, 8:07 AM

## 2021-10-28 NOTE — Progress Notes (Signed)
ANTICOAGULATION CONSULT NOTE - Initial Consult  Pharmacy Consult for IV heparin Indication: chest pain/ACS/STEMI  No Known Allergies  Patient Measurements: Height: '5\' 8"'$  (172.7 cm) Weight: 116.6 kg (257 lb) IBW/kg (Calculated) : 68.4 Heparin Dosing Weight: 94.8 kg  Vital Signs: Temp: 98.1 F (36.7 C) (05/29 0332) Temp Source: Oral (05/28 2356) BP: 111/62 (05/29 0332) Pulse Rate: 54 (05/29 0332)  Labs: Recent Labs    10/26/21 0825 10/26/21 1109 10/26/21 1433 10/26/21 1650 10/26/21 2133 10/27/21 0331 10/28/21 0535  HGB 12.4*  --   --   --   --  12.9*  --   HCT 38.3*  --   --   --   --  39.9  --   PLT 351  --   --   --   --  348  --   APTT  --   --  168*  --   --   --   --   LABPROT  --   --  14.1  --   --   --   --   INR  --   --  1.1  --   --   --   --   HEPARINUNFRC  --   --   --   --  0.46 0.58 0.53  CREATININE 1.60*  --   --   --   --  1.48*  --   TROPONINIHS 94* 103*  --  99*  --   --   --     Estimated Creatinine Clearance: 53.5 mL/min (A) (by C-G formula based on SCr of 1.48 mg/dL (H)).  Medical History: Past Medical History:  Diagnosis Date   CAD (coronary artery disease)    CKD (chronic kidney disease)    Diabetes mellitus without complication (HCC)    GERD (gastroesophageal reflux disease)    Hyperlipemia    Hypertension    Medications:  Scheduled:   aspirin  81 mg Oral Daily   atorvastatin  80 mg Oral Daily   clopidogrel  75 mg Oral Daily   insulin aspart  0-20 Units Subcutaneous TID WC   insulin glargine-yfgn  20 Units Subcutaneous QHS   isosorbide mononitrate  60 mg Oral Daily   lisinopril  10 mg Oral Daily   metoprolol tartrate  25 mg Oral BID   pantoprazole  40 mg Oral Daily   Infusions:   heparin 1,250 Units/hr (10/28/21 0325)   PRN: acetaminophen, nitroGLYCERIN, ondansetron (ZOFRAN) IV  Not on PTA anticoagulation per my chart review  Assessment: 76 year old male presenting with left sided chest pain. PMH coronary artery disease  status post CABG, diabetes mellitus with complications of stage IIIa chronic kidney disease, hypertension, dyslipidemia, morbid obesity. Initial troponin elevated.   Baseline labs: Hgb, Plts WNL.Ordered aPTT, PT-INR, however heparin started prior to labs drawn due to pt status.  Goal of Therapy:  Heparin level 0.3-0.7 units/ml Monitor platelets by anticoagulation protocol: Yes   Plan:  5/29: HL @ 0535 = 0.53, therapeutic X 3 Will continue pt on current rate and recheck HL on 5/30 with AM labs.   Kayton Dunaj D, PharmD 10/28/2021 6:46 AM

## 2021-10-28 NOTE — Progress Notes (Signed)
[facility]  SUBJECTIVE: Patient laying in bed, denies chest pain or shortness of breath   Vitals:   10/27/21 1950 10/27/21 2356 10/28/21 0332 10/28/21 0846  BP: 111/63 106/61 111/62 (!) 120/54  Pulse: (!) 56 (!) 56 (!) 54 61  Resp: '18 16 16 18  '$ Temp: 98.2 F (36.8 C) 97.7 F (36.5 C) 98.1 F (36.7 C) 98.4 F (36.9 C)  TempSrc: Oral Oral  Oral  SpO2: 94% 96% 95% 100%  Weight:      Height:         Intake/Output Summary (Last 24 hours) at 10/28/2021 0258 Last data filed at 10/28/2021 5277 Gross per 24 hour  Intake 1379.82 ml  Output 1500 ml  Net -120.18 ml      PHYSICAL EXAM  General: Well developed, well nourished, in no acute distress HEENT:  Normocephalic and atramatic Neck:  No JVD.  Lungs: Clear bilaterally to auscultation and percussion. Heart: HRRR . Normal S1 and S2 without gallops or murmurs.  Abdomen: Bowel sounds are positive, abdomen soft and non-tender  Msk:  Back normal, normal gait. Normal strength and tone for age. Extremities: No clubbing, cyanosis or edema.   Neuro: Alert and oriented X 3. Psych:  Good affect, responds appropriately   LABS: Basic Metabolic Panel: Recent Labs    10/26/21 0825 10/27/21 0331  NA 134* 136  K 3.8 4.4  CL 104 105  CO2 19* 24  GLUCOSE 254* 158*  BUN 20 23  CREATININE 1.60* 1.48*  CALCIUM 9.3 9.6   Liver Function Tests: Recent Labs    10/26/21 0825  AST 23  ALT 22  ALKPHOS 64  BILITOT 0.7  PROT 7.7  ALBUMIN 3.5   Recent Labs    10/26/21 0825  LIPASE 32   CBC: Recent Labs    10/26/21 0825 10/27/21 0331  WBC 7.2 8.8  NEUTROABS 5.1  --   HGB 12.4* 12.9*  HCT 38.3* 39.9  MCV 87.2 87.9  PLT 351 348   Cardiac Enzymes: No results for input(s): CKTOTAL, CKMB, CKMBINDEX, TROPONINI in the last 72 hours. BNP: Invalid input(s): POCBNP D-Dimer: No results for input(s): DDIMER in the last 72 hours. Hemoglobin A1C: Recent Labs    10/26/21 1650  HGBA1C 11.3*   Fasting Lipid Panel: Recent  Labs    10/27/21 0331  CHOL 334*  HDL 43  LDLCALC 236*  TRIG 273*  CHOLHDL 7.8   Thyroid Function Tests: No results for input(s): TSH, T4TOTAL, T3FREE, THYROIDAB in the last 72 hours.  Invalid input(s): FREET3 Anemia Panel: No results for input(s): VITAMINB12, FOLATE, FERRITIN, TIBC, IRON, RETICCTPCT in the last 72 hours.  No results found.   Echo pending  TELEMETRY: Sinus cardia at 57 bpm:  ASSESSMENT AND PLAN:  Principal Problem:   ACS (acute coronary syndrome) (HCC) Active Problems:   Essential hypertension   CAD (coronary artery disease)   Hyperglycemia due to type 2 diabetes mellitus (HCC)   Stage 3a chronic kidney disease (HCC)   Obesity, Class III, BMI 40-49.9 (morbid obesity) ([place])    1.  Mildly elevated troponin, (94, 103, 99), without significant delta, with atypical chest pain, without ECG changes, on heparin infusion, on aspirin and clopidogrel, with intermittent chest pain, with atypical features 2.  CAD, CABG x 3, 2005, followed by Dr. Clayborn Bigness, cardiac catheterization 04/15/2021 revealed occluded proximal LAD, proximal RCA, proximal OM1, 70% mid left circumflex, patent LIMA to LAD, patent SVG to PDA and OM1 3.  Chronic HFpEF 4.  CKD, BUN  and creatinine 21.60, GFR 45  Recommendations  1.  Continue current medications 2.  Heparin infusion x48 hours 3.  Continue aspirin and clopidogrel 4.  Review 2D echocardiogram 5.  Lexiscan Myoview in a.m.   Isaias Cowman, MD, PhD, [facility] 10/28/2021 9:03 AM

## 2021-10-29 ENCOUNTER — Inpatient Hospital Stay: Payer: Medicare Other

## 2021-10-29 ENCOUNTER — Encounter: Payer: Self-pay | Admitting: Internal Medicine

## 2021-10-29 DIAGNOSIS — I249 Acute ischemic heart disease, unspecified: Secondary | ICD-10-CM | POA: Diagnosis not present

## 2021-10-29 LAB — NM MYOCAR MULTI W/SPECT W/WALL MOTION / EF
Base ST Depression (mm): 0 mm
Estimated workload: 1
Exercise duration (sec): 54 s
LV dias vol: 59 mL (ref 62–150)
LV sys vol: 33 mL
MPHR: 145 {beats}/min
Nuc Stress EF: 44 %
Peak HR: 82 {beats}/min
Percent HR: 56 %
Rest HR: 53 {beats}/min
Rest Nuclear Isotope Dose: 10.3 mCi
SDS: 3
SRS: 0
SSS: 5
ST Depression (mm): 0 mm
Stress Nuclear Isotope Dose: 31.7 mCi
TID: 0.72

## 2021-10-29 LAB — GLUCOSE, CAPILLARY: Glucose-Capillary: 153 mg/dL — ABNORMAL HIGH (ref 70–99)

## 2021-10-29 MED ORDER — TECHNETIUM TC 99M TETROFOSMIN IV KIT
30.0000 | PACK | Freq: Once | INTRAVENOUS | Status: AC
  Administered 2021-10-29: 31.67 via INTRAVENOUS

## 2021-10-29 MED ORDER — ISOSORBIDE MONONITRATE ER 60 MG PO TB24: 60.0000 mg | ORAL_TABLET | Freq: Every day | ORAL | 0 refills | Status: DC

## 2021-10-29 MED ORDER — CLOPIDOGREL BISULFATE 75 MG PO TABS: 75.0000 mg | ORAL_TABLET | Freq: Every day | ORAL | 0 refills | Status: AC

## 2021-10-29 MED ORDER — TECHNETIUM TC 99M TETROFOSMIN IV KIT
10.3400 | PACK | Freq: Once | INTRAVENOUS | Status: AC | PRN
  Administered 2021-10-29: 10.34 via INTRAVENOUS

## 2021-10-29 MED ORDER — REGADENOSON 0.4 MG/5ML IV SOLN
0.4000 mg | Freq: Once | INTRAVENOUS | Status: AC
  Administered 2021-10-29: 0.4 mg via INTRAVENOUS
  Filled 2021-10-29: qty 5

## 2021-10-29 NOTE — Care Management Important Message (Signed)
Important Message  Patient Details  Name: Justin Keith MRN: [id number] Date of Birth: Feb 18, 1946   Medicare Important Message Given:  Yes  Patient out of room for procedure.  Copy of Medicare IM left in room for reference.   Dannette Barbara 10/29/2021, 11:06 AM

## 2021-10-29 NOTE — Progress Notes (Signed)
[facility]  Patient ID: [name] MRN: [id number] DOB/AGE: 1946/01/26 76 y.o.   Admit date: 10/26/2021 Referring Physician [place], [place] Primary Cardiologist [facility] Reason for Consultation h/o CABG, chest pain, elevated troponin   HPI:  Justin Keith is a 76 year old male with history of CAD s/p CABG x3 2005, hypertension, hyperlipidemia, type 2 diabetes, morbid obesity, CKD who presents with chest pain and was discovered to have a mildly elevated troponin.  Interval history: - no acute events - denies further chest pain overnight - echo resulted with preserved EF, g1 dd - lexiscan myoview performed around 12:30p   Vitals:   10/28/21 2352 10/29/21 0333 10/29/21 0513 10/29/21 0802  BP: (!) 93/48 119/60  129/69  Pulse: (!) 55 (!) 55  (!) 51  Resp: '17 18  16  '$ Temp: 99 F (37.2 C) 98.6 F (37 C)  98.4 F (36.9 C)  TempSrc:    Oral  SpO2: 95% 96%  96%  Weight:   110.8 kg   Height:         Intake/Output Summary (Last 24 hours) at 10/29/2021 1123 Last data filed at 10/29/2021 1012 Gross per 24 hour  Intake 671.96 ml  Output 1200 ml  Net -528.04 ml     PHYSICAL EXAM  General: Elderly black male, well developed, well nourished, in no acute distress HEENT:  Normocephalic and atraumatic Neck:  No JVD.  Lungs: normal respiratory effort on room air.  Heart: HRRR . Normal S1 and S2 without gallops or murmurs.  Abdomen: Obese appearing.  Msk: Normal strength and tone for age. Extremities: No clubbing, cyanosis or edema.   Neuro: Alert and oriented X 3. Psych:  Good affect, responds appropriately   LABS: Basic Metabolic Panel: Recent Labs    10/27/21 0331  NA 136  K 4.4  CL 105  CO2 24  GLUCOSE 158*  BUN 23  CREATININE 1.48*  CALCIUM 9.6    Liver Function Tests: No results for input(s): AST, ALT, ALKPHOS, BILITOT, PROT, ALBUMIN in the last 72 hours.  No results for input(s): LIPASE, AMYLASE  in the last 72 hours.  CBC: Recent Labs    10/27/21 0331  WBC 8.8  HGB 12.9*  HCT 39.9  MCV 87.9  PLT 348    Cardiac Enzymes: No results for input(s): CKTOTAL, CKMB, CKMBINDEX, TROPONINI in the last 72 hours. BNP: Invalid input(s): POCBNP D-Dimer: No results for input(s): DDIMER in the last 72 hours. Hemoglobin A1C: Recent Labs    10/26/21 1650  HGBA1C 11.3*    Fasting Lipid Panel: Recent Labs    10/27/21 0331  CHOL 334*  HDL 43  LDLCALC 236*  TRIG 273*  CHOLHDL 7.8    Thyroid Function Tests: No results for input(s): TSH, T4TOTAL, T3FREE, THYROIDAB in the last 72 hours.  Invalid input(s): FREET3 Anemia Panel: No results for input(s): VITAMINB12, FOLATE, FERRITIN, TIBC, IRON, RETICCTPCT in the last 72 hours.  ECHOCARDIOGRAM COMPLETE  Result Date: 10/28/2021    ECHOCARDIOGRAM REPORT   Patient Name:   Justin Keith Date of Exam: 10/28/2021 Medical Rec #:  [id number]        Height:       68.0 in Accession #:    [id number]       Weight:       257.0 lb Date of Birth:  12-Aug-1945        BSA:          2.274 m Patient Age:  76 years         BP:           111/62 mmHg Patient Gender: M                HR:           54 bpm. Exam Location:  [facility] Procedure: 2D Echo, Cardiac Doppler, Color Doppler and Intracardiac            Opacification Agent Indications:     R07.9 Chest pain  History:         Patient has prior history of Echocardiogram examinations.                  Cardiomyopathy, CAD, Prior CABG; Risk Factors:Hypertension,                  Dyslipidemia and Diabetes.  Sonographer:     Rosalia Hammers Referring Phys:  [id number] [id number] Diagnosing Phys: Isaias Cowman MD  Sonographer Comments: Suboptimal apical window and suboptimal subcostal window. Image acquisition challenging due to patient body habitus and Image acquisition challenging due to respiratory motion. IMPRESSIONS  1. Left ventricular ejection fraction, by estimation, is 50 to 55%. The left ventricle has low  normal function. The left ventricle has no regional wall motion abnormalities. Left ventricular diastolic parameters are consistent with Grade I diastolic dysfunction (impaired relaxation).  2. Right ventricular systolic function is normal. The right ventricular size is normal.  3. The mitral valve is normal in structure. Mild mitral valve regurgitation. No evidence of mitral stenosis.  4. The aortic valve is normal in structure. Aortic valve regurgitation is not visualized. Aortic valve sclerosis is present, with no evidence of aortic valve stenosis.  5. There is mild dilatation of the aortic root, measuring 41 mm.  6. The inferior vena cava is normal in size with greater than 50% respiratory variability, suggesting right atrial pressure of 3 mmHg. FINDINGS  Left Ventricle: Left ventricular ejection fraction, by estimation, is 50 to 55%. The left ventricle has low normal function. The left ventricle has no regional wall motion abnormalities. Definity contrast agent was given IV to delineate the left ventricular endocardial borders. The left ventricular internal cavity size was normal in size. There is no left ventricular hypertrophy. Left ventricular diastolic parameters are consistent with Grade I diastolic dysfunction (impaired relaxation). Right Ventricle: The right ventricular size is normal. No increase in right ventricular wall thickness. Right ventricular systolic function is normal. Left Atrium: Left atrial size was normal in size. Right Atrium: Right atrial size was normal in size. Pericardium: There is no evidence of pericardial effusion. Mitral Valve: The mitral valve is normal in structure. Mild mitral valve regurgitation. No evidence of mitral valve stenosis. MV peak gradient, 2.0 mmHg. The mean mitral valve gradient is 1.0 mmHg. Tricuspid Valve: The tricuspid valve is normal in structure. Tricuspid valve regurgitation is mild . No evidence of tricuspid stenosis. Aortic Valve: The aortic valve is  normal in structure. Aortic valve regurgitation is not visualized. Aortic valve sclerosis is present, with no evidence of aortic valve stenosis. Aortic valve mean gradient measures 4.0 mmHg. Aortic valve peak gradient measures 7.1 mmHg. Aortic valve area, by VTI measures 2.26 cm. Pulmonic Valve: The pulmonic valve was normal in structure. Pulmonic valve regurgitation is not visualized. No evidence of pulmonic stenosis. Aorta: The aortic root is normal in size and structure. There is mild dilatation of the aortic root, measuring 41 mm. Venous: The inferior vena cava is normal  in size with greater than 50% respiratory variability, suggesting right atrial pressure of 3 mmHg. IAS/Shunts: No atrial level shunt detected by color flow Doppler.  LEFT VENTRICLE PLAX 2D LVIDd:         3.38 cm   Diastology LVIDs:         2.55 cm   LV e' medial:    7.40 cm/s LV PW:         1.66 cm   LV E/e' medial:  6.1 LV IVS:        1.35 cm   LV e' lateral:   8.81 cm/s LVOT diam:     2.00 cm   LV E/e' lateral: 5.1 LV SV:         54 LV SV Index:   24 LVOT Area:     3.14 cm  RIGHT VENTRICLE RV Basal diam:  3.29 cm RV S prime:     10.30 cm/s LEFT ATRIUM             Index        RIGHT ATRIUM           Index LA diam:        3.70 cm 1.63 cm/m   RA Area:     14.40 cm LA Vol (A2C):   64.4 ml 28.33 ml/m  RA Volume:   28.50 ml  12.54 ml/m LA Vol (A4C):   49.6 ml 21.82 ml/m LA Biplane Vol: 59.3 ml 26.08 ml/m  AORTIC VALVE                    PULMONIC VALVE AV Area (Vmax):    1.91 cm     PV Vmax:       0.90 m/s AV Area (Vmean):   1.93 cm     PV Vmean:      59.700 cm/s AV Area (VTI):     2.26 cm     PV VTI:        0.173 m AV Vmax:           133.00 cm/s  PV Peak grad:  3.2 mmHg AV Vmean:          98.000 cm/s  PV Mean grad:  2.0 mmHg AV VTI:            0.238 m AV Peak Grad:      7.1 mmHg AV Mean Grad:      4.0 mmHg LVOT Vmax:         80.90 cm/s LVOT Vmean:        60.200 cm/s LVOT VTI:          0.171 m LVOT/AV VTI ratio: 0.72  AORTA Ao Root diam:  3.20 cm MITRAL VALVE MV Area (PHT): 3.36 cm    SHUNTS MV Area VTI:   1.77 cm    Systemic VTI:  0.17 m MV Peak grad:  2.0 mmHg    Systemic Diam: 2.00 cm MV Mean grad:  1.0 mmHg MV Vmax:       0.72 m/s MV Vmean:      37.1 cm/s MV Decel Time: 226 msec MV E velocity: 45.00 cm/s MV A velocity: 60.80 cm/s MV E/A ratio:  0.74 Isaias Cowman MD Electronically signed by Isaias Cowman MD Signature Date/Time: 10/28/2021/2:10:24 PM    Final      Echo pending  TELEMETRY: Sinus cardia at 57 bpm:  ASSESSMENT AND PLAN:  Principal Problem:   ACS (acute coronary syndrome) (HCC) Active Problems:  Essential hypertension   CAD (coronary artery disease)   Hyperglycemia due to type 2 diabetes mellitus (HCC)   Stage 3a chronic kidney disease (HCC)   Obesity, Class III, BMI 40-49.9 (morbid obesity) ([place])    1.  Mildly elevated troponin, (94, 103, 99), without significant delta, with atypical chest pain, without ECG changes, on heparin infusion, on aspirin and clopidogrel, with intermittent chest pain, with atypical features 2.  CAD, CABG x 3, 2005, followed by Dr. Clayborn Bigness, cardiac catheterization 04/15/2021 revealed occluded proximal LAD, proximal RCA, proximal OM1, 70% mid left circumflex, patent LIMA to LAD, patent SVG to PDA and OM1 3.  Chronic HFpEF 4.  CKD, BUN and creatinine 21.60, GFR 45  Recommendations  1.  Continue current medications 2.  S/p heparin infusion x48 hours 3.  Continue aspirin and clopidogrel 4.  Echo resulted with preserved EF, G1DD 5.  Lexiscan Myoview resulted without scar or ischemia, low risk study 6.  ok for discharge today from a cardiac standpoint will need follow up with Dr. Clayborn Bigness in 1-2 weeks.   This patient's plan of care was discussed and created with Dr. Saralyn Pilar and he is in agreement.    Tristan Schroeder, PA-C  10/29/2021 11:23 AM

## 2021-10-29 NOTE — Plan of Care (Signed)
  Problem: Clinical Measurements: Goal: Cardiovascular complication will be avoided Outcome: Progressing, VSS this shift.   Problem: Pain Managment: Goal: General experience of comfort will improve Outcome: Progressing, No voiced complaints of pain.   Problem: Clinical Measurements: Goal: Ability to maintain clinical measurements within normal limits will improve Outcome: Not Progressing, Remains bradycardic.  Meds held per ordered parameters. Up with assistance only encouraged.

## 2021-10-29 NOTE — Discharge Summary (Signed)
Justin Keith HBZ:[id number] DOB: [birth year] DOA: 10/26/2021  PCP: [facility] [place] date: 10/26/2021 Discharge date: 10/29/2021  Admitted From: home Disposition:  home  Recommendations for Outpatient Follow-up:  Follow up with PCP in 1 week Please obtain BMP/CBC in one week Please follow up with cardiology in 1 to 2 weeks, Dr. Clayborn Bigness     Discharge Condition:Stable CODE STATUS:full  Diet recommendation: Heart Healthy / Carb Modified  Brief/Interim Summary: Per HPI  Justin Keith is a 76 y.o. male with medical history significant for coronary artery disease status post CABG, diabetes mellitus with complications of stage IIIa chronic kidney disease, hypertension, dyslipidemia, morbid obesity who presents to the ER for evaluation of left sided chest pain which started about 3 AM at rest.  He described the pain as a squeezing sensation radiating down his abdomen lasting about 30 minutes.  He took a baby aspirin without any significant improvement in his symptoms and so he called EMS. His troponin was elevated.  Cardiology was consulted.  He was started on heparin drip.  He underwent a stress test today that was negative for ischemia and cleared by cardiology for discharge.   ACS (acute coronary syndrome) [facility]) Cardiology following Echo EF 50-55% recieved heparin gtt x 48 hrs Continue aspirin, metoprolol, Plavix Continue statins Imdur increased to 60 mg daily Negative Lexiscan Myoview      CAD (coronary artery disease) History of CABG  continue aspirin, Plavix, statin, nitrates and metoprolol          Dyslipidemia Lipid panel not well controlled LDL goal <70 Placed on statin as he had not taken it in one year       Stage 3a chronic kidney disease ([place]) Secondary to diabetes mellitus  renal function at baseline          Obesity, Class III, BMI 40-49.9 (morbid obesity) ([place]) Patient has a BMI of 10.17 Complicates overall prognosis and  care Lifestyle modification and exercise has been discussed with him in detail       Hyperglycemia due to type 2 diabetes mellitus ([place]) Continue home meds       Essential hypertension Continue metoprolol ,nitrates and lisinopril     Discharge Diagnoses:  Principal Problem:   ACS (acute coronary syndrome) ([facility]) Active Problems:   Essential hypertension   CAD (coronary artery disease)   Hyperglycemia due to type 2 diabetes mellitus (H[facility])   Stage 3a chronic kidney disease (HCC)   Obesity, Class III, BMI 40-49.9 (morbid obesity) ([place])    Discharge Instructions  Discharge Instructions     Call MD for:  severe uncontrolled pain   Complete by: As directed    Diet - low sodium heart healthy   Complete by: As directed    Discharge instructions   Complete by: As directed    F/u with Dr. Clayborn Bigness in 1-2 week   Increase activity slowly   Complete by: As directed       Allergies as of 10/29/2021   No Known Allergies      Medication List     STOP taking these medications    amLODipine 5 MG tablet Commonly known as: NORVASC       TAKE these medications    aspirin 81 MG chewable tablet Chew 1 tablet (81 mg total) by mouth daily.   atorvastatin 80 MG tablet Commonly known as: LIPITOR Take 80 mg by mouth daily.   blood glucose meter kit and supplies Dispense based on patient and  insurance preference. Use up to four times daily as directed. (FOR ICD-10 E10.9, E11.9).   clopidogrel 75 MG tablet Commonly known as: PLAVIX Take 1 tablet (75 mg total) by mouth daily.   Farxiga 10 MG Tabs tablet Generic drug: dapagliflozin propanediol Take 10 mg by mouth daily.   HumaLOG KwikPen 100 UNIT/ML KwikPen Generic drug: insulin lispro   insulin aspart 100 UNIT/ML FlexPen Commonly known as: NOVOLOG 4 units subcutaneous injection three times a day prior to meals (okay to substitute any generic short acting insulin that insurance covers)   insulin glargine 100  UNIT/ML Solostar Pen Commonly known as: LANTUS Inject 20 Units into the skin at bedtime.   Insulin Pen Needle 32G X 4 MM Misc 1 application by Does not apply route at bedtime.   isosorbide mononitrate 60 MG 24 hr tablet Commonly known as: IMDUR Take 1 tablet (60 mg total) by mouth daily. What changed:  medication strength how much to take   ketorolac 0.5 % ophthalmic solution Commonly known as: ACULAR SMARTSIG:In Eye(s)   lisinopril 10 MG tablet Commonly known as: ZESTRIL Take 1 tablet by mouth daily. What changed: Another medication with the same name was removed. Continue taking this medication, and follow the directions you see here.   metoprolol tartrate 25 MG tablet Commonly known as: LOPRESSOR Take 1 tablet (25 mg total) by mouth 2 (two) times daily.   nitroGLYCERIN 0.4 MG SL tablet Commonly known as: NITROSTAT Place 1 tablet (0.4 mg total) under the tongue every 5 (five) minutes as needed for chest pain.   pantoprazole 40 MG tablet Commonly known as: Protonix Take 1 tablet (40 mg total) by mouth daily.        Follow-up Information     Yolonda Kida, MD. Go in 1 week(s).   Specialties: Cardiology, Internal Medicine Contact information: [place] [address] [place], [place] Follow up in 1 week(s).   Specialty: General Practice Contact information: [place] [address] [phone]                No Known Allergies  Consultations: cardiology   Procedures/Studies: DG Chest 2 View  Result Date: 10/26/2021 CLINICAL DATA:  Chest pain EXAM: CHEST - 2 VIEW COMPARISON:  07/13/2021 FINDINGS: Borderline heart size. Prior CABG. There is no edema, consolidation, effusion, or pneumothorax. IMPRESSION: Stable exam.  No acute finding. Electronically Signed   By: Jorje Guild M.D.   On: 10/26/2021 08:52   NM Myocar Multi W/Spect W/Wall Motion /  EF  Result Date: 10/29/2021   Findings are consistent with no prior ischemia and no prior myocardial infarction. The study is low risk.   No ST deviation was noted.   LV perfusion is normal. There is no evidence of ischemia. There is no evidence of infarction.   Left ventricular function is abnormal. Global function is mildly reduced. Nuclear stress EF: 44 %. The left ventricular ejection fraction is mildly decreased (45-54%). End diastolic cavity size is normal. End systolic cavity size is normal. 1.  Mild reduced left ventricular function with estimated LV ejection fraction 44% 2.  No evidence for scar or ischemia 3.  Low cardiovascular risk   ECHOCARDIOGRAM COMPLETE  Result Date: 10/28/2021    ECHOCARDIOGRAM REPORT   Patient Name:   Justin Keith Date of Exam: 10/28/2021 Medical Rec #:  [id number]        Height:  68.0 in Accession #:    [id number]       Weight:       257.0 lb Date of Birth:  [birth year]        BSA:          2.274 m Patient Age:    12 years         BP:           111/62 mmHg Patient Gender: M                HR:           54 bpm. Exam Location:  [facility] Procedure: 2D Echo, Cardiac Doppler, Color Doppler and Intracardiac            Opacification Agent Indications:     R07.9 Chest pain  History:         Patient has prior history of Echocardiogram examinations.                  Cardiomyopathy, CAD, Prior CABG; Risk Factors:Hypertension,                  Dyslipidemia and Diabetes.  Sonographer:     Rosalia Hammers Referring Phys:  [id number] AGBATA Diagnosing Phys: Isaias Cowman MD  Sonographer Comments: Suboptimal apical window and suboptimal subcostal window. Image acquisition challenging due to patient body habitus and Image acquisition challenging due to respiratory motion. IMPRESSIONS  1. Left ventricular ejection fraction, by estimation, is 50 to 55%. The left ventricle has low normal function. The left ventricle has no regional wall motion abnormalities. Left ventricular  diastolic parameters are consistent with Grade I diastolic dysfunction (impaired relaxation).  2. Right ventricular systolic function is normal. The right ventricular size is normal.  3. The mitral valve is normal in structure. Mild mitral valve regurgitation. No evidence of mitral stenosis.  4. The aortic valve is normal in structure. Aortic valve regurgitation is not visualized. Aortic valve sclerosis is present, with no evidence of aortic valve stenosis.  5. There is mild dilatation of the aortic root, measuring 41 mm.  6. The inferior vena cava is normal in size with greater than 50% respiratory variability, suggesting right atrial pressure of 3 mmHg. FINDINGS  Left Ventricle: Left ventricular ejection fraction, by estimation, is 50 to 55%. The left ventricle has low normal function. The left ventricle has no regional wall motion abnormalities. Definity contrast agent was given IV to delineate the left ventricular endocardial borders. The left ventricular internal cavity size was normal in size. There is no left ventricular hypertrophy. Left ventricular diastolic parameters are consistent with Grade I diastolic dysfunction (impaired relaxation). Right Ventricle: The right ventricular size is normal. No increase in right ventricular wall thickness. Right ventricular systolic function is normal. Left Atrium: Left atrial size was normal in size. Right Atrium: Right atrial size was normal in size. Pericardium: There is no evidence of pericardial effusion. Mitral Valve: The mitral valve is normal in structure. Mild mitral valve regurgitation. No evidence of mitral valve stenosis. MV peak gradient, 2.0 mmHg. The mean mitral valve gradient is 1.0 mmHg. Tricuspid Valve: The tricuspid valve is normal in structure. Tricuspid valve regurgitation is mild . No evidence of tricuspid stenosis. Aortic Valve: The aortic valve is normal in structure. Aortic valve regurgitation is not visualized. Aortic valve sclerosis is present,  with no evidence of aortic valve stenosis. Aortic valve mean gradient measures 4.0 mmHg. Aortic valve peak gradient measures 7.1 mmHg. Aortic valve area, by  VTI measures 2.26 cm. Pulmonic Valve: The pulmonic valve was normal in structure. Pulmonic valve regurgitation is not visualized. No evidence of pulmonic stenosis. Aorta: The aortic root is normal in size and structure. There is mild dilatation of the aortic root, measuring 41 mm. Venous: The inferior vena cava is normal in size with greater than 50% respiratory variability, suggesting right atrial pressure of 3 mmHg. IAS/Shunts: No atrial level shunt detected by color flow Doppler.  LEFT VENTRICLE PLAX 2D LVIDd:         3.38 cm   Diastology LVIDs:         2.55 cm   LV e' medial:    7.40 cm/s LV PW:         1.66 cm   LV E/e' medial:  6.1 LV IVS:        1.35 cm   LV e' lateral:   8.81 cm/s LVOT diam:     2.00 cm   LV E/e' lateral: 5.1 LV SV:         54 LV SV Index:   24 LVOT Area:     3.14 cm  RIGHT VENTRICLE RV Basal diam:  3.29 cm RV S prime:     10.30 cm/s LEFT ATRIUM             Index        RIGHT ATRIUM           Index LA diam:        3.70 cm 1.63 cm/m   RA Area:     14.40 cm LA Vol (A2C):   64.4 ml 28.33 ml/m  RA Volume:   28.50 ml  12.54 ml/m LA Vol (A4C):   49.6 ml 21.82 ml/m LA Biplane Vol: 59.3 ml 26.08 ml/m  AORTIC VALVE                    PULMONIC VALVE AV Area (Vmax):    1.91 cm     PV Vmax:       0.90 m/s AV Area (Vmean):   1.93 cm     PV Vmean:      59.700 cm/s AV Area (VTI):     2.26 cm     PV VTI:        0.173 m AV Vmax:           133.00 cm/s  PV Peak grad:  3.2 mmHg AV Vmean:          98.000 cm/s  PV Mean grad:  2.0 mmHg AV VTI:            0.238 m AV Peak Grad:      7.1 mmHg AV Mean Grad:      4.0 mmHg LVOT Vmax:         80.90 cm/s LVOT Vmean:        60.200 cm/s LVOT VTI:          0.171 m LVOT/AV VTI ratio: 0.72  AORTA Ao Root diam: 3.20 cm MITRAL VALVE MV Area (PHT): 3.36 cm    SHUNTS MV Area VTI:   1.77 cm    Systemic VTI:  0.17 m  MV Peak grad:  2.0 mmHg    Systemic Diam: 2.00 cm MV Mean grad:  1.0 mmHg MV Vmax:       0.72 m/s MV Vmean:      37.1 cm/s MV Decel Time: 226 msec MV E velocity: 45.00 cm/s MV A velocity: 60.80 cm/s MV  E/A ratio:  0.74 Isaias Cowman MD Electronically signed by Isaias Cowman MD Signature Date/Time: 10/28/2021/2:10:24 PM    Final       Subjective: No chest pain or shortness of breath   Discharge Exam: Vitals:   10/29/21 1455 10/29/21 1613  BP: (!) 147/57 118/62  Pulse: 65 68  Resp: 18 18  Temp: 98.4 F (36.9 C) 98.1 F (36.7 C)  SpO2: 98% 96%   Vitals:   10/29/21 0513 10/29/21 0802 10/29/21 1455 10/29/21 1613  BP:  129/69 (!) 147/57 118/62  Pulse:  (!) 51 65 68  Resp:  _0 Temp:  98.4 F (36.9 C) 98.4 F (36.9 C) 98.1 F (36.7 C)  TempSrc:  Oral Oral   SpO2:  96% 98% 96%  Weight: 110.8 kg     Height:        General: Pt is alert, awake, not in acute distress Cardiovascular: RRR, S1/S2 +, no rubs, no gallops Respiratory: CTA bilaterally, no wheezing, no rhonchi Abdominal: Soft, NT, ND, bowel sounds + Extremities: no edema, no cyanosis    The results of significant diagnostics from this hospitalization (including imaging, microbiology, ancillary and laboratory) are listed below for reference.     Microbiology: No results found for this or any previous visit (from the past 240 hour(s)).   Labs: BNP (last 3 results) Recent Labs    10/26/21 0825  BNP 462.7*   Basic Metabolic Panel: Recent Labs  Lab 10/26/21 0825 10/27/21 0331  NA 134* 136  K 3.8 4.4  CL 104 105  CO2 19* 24  GLUCOSE 254* 158*  BUN 20 23  CREATININE 1.60* 1.48*  CALCIUM 9.3 9.6   Liver Function Tests: Recent Labs  Lab 10/26/21 0825  AST 23  ALT 22  ALKPHOS 64  BILITOT 0.7  PROT 7.7  ALBUMIN 3.5   Recent Labs  Lab 10/26/21 0825  LIPASE 32   No results for input(s): AMMONIA in the last 168 hours. CBC: Recent Labs  Lab 10/26/21 0825 10/27/21 0331  WBC 7.2  8.8  NEUTROABS 5.1  --   HGB 12.4* 12.9*  HCT 38.3* 39.9  MCV 87.2 87.9  PLT 351 348   Cardiac Enzymes: No results for input(s): CKTOTAL, CKMB, CKMBINDEX, TROPONINI in the last 168 hours. BNP: Invalid input(s): POCBNP CBG: Recent Labs  Lab 10/28/21 0851 10/28/21 1218 10/28/21 1702 10/28/21 2219 10/29/21 0751  GLUCAP 191* 170* 134* 169* 153*   D-Dimer No results for input(s): DDIMER in the last 72 hours. Hgb A1c Recent Labs    10/26/21 1650  HGBA1C 11.3*   Lipid Profile Recent Labs    10/27/21 0331  CHOL 334*  HDL 43  LDLCALC 236*  TRIG 273*  CHOLHDL 7.8   Thyroid function studies No results for input(s): TSH, T4TOTAL, T3FREE, THYROIDAB in the last 72 hours.  Invalid input(s): FREET3 Anemia work up No results for input(s): VITAMINB12, FOLATE, FERRITIN, TIBC, IRON, RETICCTPCT in the last 72 hours. Urinalysis    Component Value Date/Time   COLORURINE YELLOW (A) 04/13/2021 1734   APPEARANCEUR CLEAR (A) 04/13/2021 1734   LABSPEC 1.007 04/13/2021 1734   PHURINE 5.0 04/13/2021 1734   GLUCOSEU >=500 (A) 04/13/2021 1734   HGBUR NEGATIVE 04/13/2021 1734   BILIRUBINUR NEGATIVE 04/13/2021 1734   KETONESUR NEGATIVE 04/13/2021 1734   PROTEINUR NEGATIVE 04/13/2021 1734   NITRITE NEGATIVE 04/13/2021 1734   LEUKOCYTESUR NEGATIVE 04/13/2021 1734   Sepsis Labs Invalid input(s): PROCALCITONIN,  WBC,  LACTICIDVEN Microbiology No results  found for this or any previous visit (from the past 240 hour(s)).   Time coordinating discharge: Over 30 minutes  SIGNED:   Nolberto Hanlon, MD  [facility] 10/29/2021, 4:48 PM Pager   If 7PM-7AM, please contact night-coverage [email or web address] Password [email or web address]

## 2021-10-29 NOTE — Progress Notes (Signed)
Discharge instructions given to and reviewed with patient. Patient verbalized understanding of all discharge instructions including to call to make a follow up appointment with Dr. Clayborn Bigness, new prescriptions and changes to medications. Patient left unit alert with no distress noted by wheelchair with Claiborne Billings, NT. Patient's family driving him home.

## 2021-10-30 LAB — LIPOPROTEIN A (LPA): Lipoprotein (a): 211.2 nmol/L — ABNORMAL HIGH (ref <75.0)

## 2022-03-26 ENCOUNTER — Observation Stay
Admission: EM | Admit: 2022-03-26 | Discharge: 2022-03-27 | Disposition: A | Discharge status: Home / Self Care | Payer: Medicare Other | Attending: Student | Admitting: Student

## 2022-03-26 ENCOUNTER — Other Ambulatory Visit: Payer: Self-pay

## 2022-03-26 ENCOUNTER — Emergency Department: Payer: Medicare Other

## 2022-03-26 DIAGNOSIS — Z7982 Long term (current) use of aspirin: Secondary | ICD-10-CM | POA: Insufficient documentation

## 2022-03-26 DIAGNOSIS — E785 Hyperlipidemia, unspecified: Secondary | ICD-10-CM | POA: Diagnosis not present

## 2022-03-26 DIAGNOSIS — Z7902 Long term (current) use of antithrombotics/antiplatelets: Secondary | ICD-10-CM | POA: Diagnosis not present

## 2022-03-26 DIAGNOSIS — I129 Hypertensive chronic kidney disease with stage 1 through stage 4 chronic kidney disease, or unspecified chronic kidney disease: Secondary | ICD-10-CM | POA: Insufficient documentation

## 2022-03-26 DIAGNOSIS — R7989 Other specified abnormal findings of blood chemistry: Secondary | ICD-10-CM | POA: Insufficient documentation

## 2022-03-26 DIAGNOSIS — I10 Essential (primary) hypertension: Secondary | ICD-10-CM | POA: Diagnosis present

## 2022-03-26 DIAGNOSIS — Z794 Long term (current) use of insulin: Secondary | ICD-10-CM | POA: Insufficient documentation

## 2022-03-26 DIAGNOSIS — N1832 Chronic kidney disease, stage 3b: Secondary | ICD-10-CM | POA: Diagnosis not present

## 2022-03-26 DIAGNOSIS — E1122 Type 2 diabetes mellitus with diabetic chronic kidney disease: Secondary | ICD-10-CM | POA: Insufficient documentation

## 2022-03-26 DIAGNOSIS — R0789 Other chest pain: Secondary | ICD-10-CM | POA: Diagnosis present

## 2022-03-26 DIAGNOSIS — R079 Chest pain, unspecified: Secondary | ICD-10-CM

## 2022-03-26 DIAGNOSIS — I200 Unstable angina: Secondary | ICD-10-CM

## 2022-03-26 DIAGNOSIS — E876 Hypokalemia: Secondary | ICD-10-CM | POA: Diagnosis not present

## 2022-03-26 DIAGNOSIS — F17220 Nicotine dependence, chewing tobacco, uncomplicated: Secondary | ICD-10-CM | POA: Insufficient documentation

## 2022-03-26 DIAGNOSIS — D509 Iron deficiency anemia, unspecified: Secondary | ICD-10-CM | POA: Diagnosis not present

## 2022-03-26 DIAGNOSIS — I2510 Atherosclerotic heart disease of native coronary artery without angina pectoris: Secondary | ICD-10-CM | POA: Diagnosis not present

## 2022-03-26 DIAGNOSIS — I25110 Atherosclerotic heart disease of native coronary artery with unstable angina pectoris: Secondary | ICD-10-CM | POA: Insufficient documentation

## 2022-03-26 DIAGNOSIS — Z7985 Long-term (current) use of injectable non-insulin antidiabetic drugs: Secondary | ICD-10-CM | POA: Insufficient documentation

## 2022-03-26 DIAGNOSIS — E119 Type 2 diabetes mellitus without complications: Secondary | ICD-10-CM

## 2022-03-26 DIAGNOSIS — Z79899 Other long term (current) drug therapy: Secondary | ICD-10-CM | POA: Diagnosis not present

## 2022-03-26 LAB — BASIC METABOLIC PANEL
Anion gap: 9 (ref 5–15)
BUN: 16 mg/dL (ref 8–23)
CO2: 19 mmol/L — ABNORMAL LOW (ref 22–32)
Calcium: 9 mg/dL (ref 8.9–10.3)
Chloride: 110 mmol/L (ref 98–111)
Creatinine, Ser: 1.36 mg/dL — ABNORMAL HIGH (ref 0.61–1.24)
GFR, Estimated: 54 mL/min — ABNORMAL LOW (ref >60)
Glucose, Bld: 166 mg/dL — ABNORMAL HIGH (ref 70–99)
Potassium: 3.9 mmol/L (ref 3.5–5.1)
Sodium: 138 mmol/L (ref 135–145)

## 2022-03-26 LAB — CBC
HCT: 36.7 % — ABNORMAL LOW (ref 39.0–52.0)
Hemoglobin: 11.7 g/dL — ABNORMAL LOW (ref 13.0–17.0)
MCH: 28 pg (ref 26.0–34.0)
MCHC: 31.9 g/dL (ref 30.0–36.0)
MCV: 87.8 fL (ref 80.0–100.0)
Platelets: 304 10*3/uL (ref 150–400)
RBC: 4.18 MIL/uL — ABNORMAL LOW (ref 4.22–5.81)
RDW: 14.7 % (ref 11.5–15.5)
WBC: 9.3 10*3/uL (ref 4.0–10.5)
nRBC: 0 % (ref 0.0–0.2)

## 2022-03-26 LAB — CBC WITH DIFFERENTIAL/PLATELET
Abs Immature Granulocytes: 0.03 10*3/uL (ref 0.00–0.07)
Basophils Absolute: 0.1 10*3/uL (ref 0.0–0.1)
Basophils Relative: 1 %
Eosinophils Absolute: 0.2 10*3/uL (ref 0.0–0.5)
Eosinophils Relative: 2 %
HCT: 36 % — ABNORMAL LOW (ref 39.0–52.0)
Hemoglobin: 11.7 g/dL — ABNORMAL LOW (ref 13.0–17.0)
Immature Granulocytes: 0 %
Lymphocytes Relative: 23 %
Lymphs Abs: 2 10*3/uL (ref 0.7–4.0)
MCH: 28.9 pg (ref 26.0–34.0)
MCHC: 32.5 g/dL (ref 30.0–36.0)
MCV: 88.9 fL (ref 80.0–100.0)
Monocytes Absolute: 0.7 10*3/uL (ref 0.1–1.0)
Monocytes Relative: 8 %
Neutro Abs: 5.9 10*3/uL (ref 1.7–7.7)
Neutrophils Relative %: 66 %
Platelets: 291 10*3/uL (ref 150–400)
RBC: 4.05 MIL/uL — ABNORMAL LOW (ref 4.22–5.81)
RDW: 14.6 % (ref 11.5–15.5)
WBC: 8.9 10*3/uL (ref 4.0–10.5)
nRBC: 0 % (ref 0.0–0.2)

## 2022-03-26 LAB — COMPREHENSIVE METABOLIC PANEL
ALT: 19 U/L (ref 0–44)
AST: 16 U/L (ref 15–41)
Albumin: 3.6 g/dL (ref 3.5–5.0)
Alkaline Phosphatase: 56 U/L (ref 38–126)
Anion gap: 9 (ref 5–15)
BUN: 16 mg/dL (ref 8–23)
CO2: 19 mmol/L — ABNORMAL LOW (ref 22–32)
Calcium: 8.9 mg/dL (ref 8.9–10.3)
Chloride: 110 mmol/L (ref 98–111)
Creatinine, Ser: 1.48 mg/dL — ABNORMAL HIGH (ref 0.61–1.24)
GFR, Estimated: 49 mL/min — ABNORMAL LOW (ref >60)
Glucose, Bld: 185 mg/dL — ABNORMAL HIGH (ref 70–99)
Potassium: 3.3 mmol/L — ABNORMAL LOW (ref 3.5–5.1)
Sodium: 138 mmol/L (ref 135–145)
Total Bilirubin: 0.4 mg/dL (ref 0.3–1.2)
Total Protein: 7.4 g/dL (ref 6.5–8.1)

## 2022-03-26 LAB — GLUCOSE, CAPILLARY
Glucose-Capillary: 156 mg/dL — ABNORMAL HIGH (ref 70–99)
Glucose-Capillary: 178 mg/dL — ABNORMAL HIGH (ref 70–99)
Glucose-Capillary: 156 mg/dL — ABNORMAL HIGH (ref 70–99)

## 2022-03-26 LAB — TROPONIN I (HIGH SENSITIVITY)
Troponin I (High Sensitivity): 60 ng/L — ABNORMAL HIGH (ref <18)
Troponin I (High Sensitivity): 65 ng/L — ABNORMAL HIGH (ref <18)

## 2022-03-26 LAB — PROTIME-INR
INR: 1.1 (ref 0.8–1.2)
Prothrombin Time: 13.7 seconds (ref 11.4–15.2)

## 2022-03-26 LAB — MAGNESIUM: Magnesium: 2.2 mg/dL (ref 1.7–2.4)

## 2022-03-26 LAB — LIPASE, BLOOD: Lipase: 32 U/L (ref 11–51)

## 2022-03-26 LAB — HEPARIN LEVEL (UNFRACTIONATED)
Heparin Unfractionated: 0.45 IU/mL (ref 0.30–0.70)
Heparin Unfractionated: <0.1 IU/mL — ABNORMAL LOW (ref 0.30–0.70)

## 2022-03-26 LAB — APTT: aPTT: 31 seconds (ref 24–36)

## 2022-03-26 MED ORDER — ACETAMINOPHEN 650 MG RE SUPP: 650.0000 mg | Freq: Four times a day (QID) | RECTAL | Status: DC | PRN

## 2022-03-26 MED ORDER — HEPARIN (PORCINE) 25000 UT/250ML-% IV SOLN: 14.0000 [IU]/kg/h | INTRAVENOUS | Status: DC

## 2022-03-26 MED ORDER — NITROGLYCERIN 0.4 MG SL SUBL
0.4000 mg | SUBLINGUAL_TABLET | SUBLINGUAL | Status: DC | PRN
Start: 2022-03-26 — End: 2022-03-27

## 2022-03-26 MED ORDER — LISINOPRIL 10 MG PO TABS
10.0000 mg | ORAL_TABLET | Freq: Every day | ORAL | Status: DC
  Administered 2022-03-26 – 2022-03-27 (×2): 10 mg via ORAL
  Filled 2022-03-26 (×2): qty 1

## 2022-03-26 MED ORDER — POTASSIUM CHLORIDE CRYS ER 20 MEQ PO TBCR
40.0000 meq | EXTENDED_RELEASE_TABLET | Freq: Once | ORAL | Status: AC
  Administered 2022-03-26: 40 meq via ORAL
  Filled 2022-03-26: qty 2

## 2022-03-26 MED ORDER — ASPIRIN 81 MG PO TBEC
81.0000 mg | DELAYED_RELEASE_TABLET | Freq: Every day | ORAL | Status: DC
  Administered 2022-03-26 – 2022-03-27 (×2): 81 mg via ORAL
  Filled 2022-03-26 (×2): qty 1

## 2022-03-26 MED ORDER — PANTOPRAZOLE SODIUM 40 MG PO TBEC
40.0000 mg | DELAYED_RELEASE_TABLET | Freq: Every day | ORAL | Status: DC
  Administered 2022-03-26 – 2022-03-27 (×2): 40 mg via ORAL
  Filled 2022-03-26 (×2): qty 1

## 2022-03-26 MED ORDER — CLOPIDOGREL BISULFATE 75 MG PO TABS
75.0000 mg | ORAL_TABLET | Freq: Every day | ORAL | Status: DC
  Administered 2022-03-26 – 2022-03-27 (×2): 75 mg via ORAL
  Filled 2022-03-26 (×2): qty 1

## 2022-03-26 MED ORDER — HEPARIN SODIUM (PORCINE) 5000 UNIT/ML IJ SOLN: 4000.0000 [IU] | Freq: Once | INTRAMUSCULAR | Status: DC

## 2022-03-26 MED ORDER — POTASSIUM CHLORIDE IN NACL 20-0.9 MEQ/L-% IV SOLN
INTRAVENOUS | Status: DC
  Administered 2022-03-27: 100 mL/h via INTRAVENOUS
  Filled 2022-03-26 (×5): qty 1000

## 2022-03-26 MED ORDER — DAPAGLIFLOZIN PROPANEDIOL 10 MG PO TABS
10.0000 mg | ORAL_TABLET | Freq: Every day | ORAL | Status: DC
  Administered 2022-03-26 – 2022-03-27 (×2): 10 mg via ORAL
  Filled 2022-03-26 (×2): qty 1

## 2022-03-26 MED ORDER — INSULIN GLARGINE-YFGN 100 UNIT/ML ~~LOC~~ SOLN
20.0000 [IU] | Freq: Every day | SUBCUTANEOUS | Status: DC
  Administered 2022-03-26: 20 [IU] via SUBCUTANEOUS
  Filled 2022-03-26 (×2): qty 0.2

## 2022-03-26 MED ORDER — TRAZODONE HCL 50 MG PO TABS: 25.0000 mg | ORAL_TABLET | Freq: Every evening | ORAL | Status: DC | PRN

## 2022-03-26 MED ORDER — HEPARIN (PORCINE) 25000 UT/250ML-% IV SOLN
1400.0000 [IU]/h | INTRAVENOUS | Status: DC
  Administered 2022-03-26: 1400 [IU]/h via INTRAVENOUS
  Filled 2022-03-26: qty 250

## 2022-03-26 MED ORDER — MAGNESIUM HYDROXIDE 400 MG/5ML PO SUSP: 30.0000 mL | Freq: Every day | ORAL | Status: DC | PRN

## 2022-03-26 MED ORDER — METOPROLOL TARTRATE 25 MG PO TABS
25.0000 mg | ORAL_TABLET | Freq: Two times a day (BID) | ORAL | Status: DC
  Administered 2022-03-26 (×2): 25 mg via ORAL
  Filled 2022-03-26 (×3): qty 1

## 2022-03-26 MED ORDER — INSULIN ASPART 100 UNIT/ML IJ SOLN: 0.0000 [IU] | Freq: Every day | INTRAMUSCULAR | Status: DC

## 2022-03-26 MED ORDER — ACETAMINOPHEN 325 MG PO TABS: 650.0000 mg | ORAL_TABLET | Freq: Four times a day (QID) | ORAL | Status: DC | PRN

## 2022-03-26 MED ORDER — ATORVASTATIN CALCIUM 80 MG PO TABS
80.0000 mg | ORAL_TABLET | Freq: Every day | ORAL | Status: DC
  Administered 2022-03-26 – 2022-03-27 (×2): 80 mg via ORAL
  Filled 2022-03-26: qty 4
  Filled 2022-03-26: qty 1

## 2022-03-26 MED ORDER — ISOSORBIDE MONONITRATE ER 60 MG PO TB24
60.0000 mg | ORAL_TABLET | Freq: Every day | ORAL | Status: DC
  Administered 2022-03-26 – 2022-03-27 (×2): 60 mg via ORAL
  Filled 2022-03-26 (×2): qty 1

## 2022-03-26 MED ORDER — NITROGLYCERIN 2 % TD OINT
1.0000 [in_us] | TOPICAL_OINTMENT | Freq: Once | TRANSDERMAL | Status: AC
  Administered 2022-03-26: 1 [in_us] via TOPICAL
  Filled 2022-03-26: qty 1

## 2022-03-26 MED ORDER — ONDANSETRON HCL 4 MG PO TABS: 4.0000 mg | ORAL_TABLET | Freq: Four times a day (QID) | ORAL | Status: DC | PRN

## 2022-03-26 MED ORDER — INSULIN ASPART 100 UNIT/ML IJ SOLN
0.0000 [IU] | Freq: Three times a day (TID) | INTRAMUSCULAR | Status: DC
  Administered 2022-03-26 – 2022-03-27 (×3): 3 [IU] via SUBCUTANEOUS
  Filled 2022-03-26 (×3): qty 1

## 2022-03-26 MED ORDER — ONDANSETRON HCL 4 MG/2ML IJ SOLN: 4.0000 mg | Freq: Four times a day (QID) | INTRAMUSCULAR | Status: DC | PRN

## 2022-03-26 MED ORDER — HEPARIN BOLUS VIA INFUSION
4000.0000 [IU] | Freq: Once | INTRAVENOUS | Status: AC
  Administered 2022-03-26: 4000 [IU] via INTRAVENOUS
  Filled 2022-03-26: qty 4000

## 2022-03-26 NOTE — Consult Note (Signed)
[facility] Cardiology Consultation Note  Patient ID: Justin Keith, MRN: [id number], DOB/AGE: [birth year] 76 y.o. Admit date: 03/26/2022   Date of Consult: 03/26/2022 Primary Physician: [facility], [place] Primary Cardiologist: [facility]  Chief Complaint:  Chief Complaint  Patient presents with   Chest Pain   Reason for Consult:  Chest pain  HPI: 76 y.o. male with known coronary artery disease this was coronary bypass graft hypertension hyperlipidemia chronic kidney disease for which the patient was seen in the emergency room for an acute onset of resting chest discomfort substernal in nature with slight shortness of breath.  He claims that this is occurring for approximately 5 minutes and was relieved somewhat by time as well as possible nitroglycerin.  It was waxing and waning slightly until seen in the emergency room for which he had full relief.  Since that he has not had any chest pain or other significant symptoms.  EKG shows normal sinus rhythm with frequent preventricular contractions inferior and possible anterior myocardial infarction age undetermined.  The patient's troponin has been 60 and 65 without evidence of acute myocardial infarction.  He had a cardiac catheterization last year showing patent grafts with circumflex artery stenosis to obtuse marginal which may cause anginal symptoms.  He has been on high intensity cholesterol therapy Farxiga isosorbide lisinopril and metoprolol for appropriate treatment of his anginal symptoms and further risk reduction.  Currently based on above the patient would be appropriately continued medical management with ambulation.  If ambulating well potentially could be discharged home.  If further chest discomfort would also consider the possibility Lexiscan infusion stress test.  Past Medical History:  Diagnosis Date   CAD (coronary artery disease)    CKD (chronic kidney disease)    Diabetes mellitus without complication  (HCC)    GERD (gastroesophageal reflux disease)    Hyperlipemia    Hypertension       Surgical History:  Past Surgical History:  Procedure Laterality Date   CARDIAC CATHETERIZATION     CARDIAC SURGERY     COLONOSCOPY WITH PROPOFOL N/A 02/25/2018   Procedure: COLONOSCOPY WITH PROPOFOL;  Surgeon: Jonathon Bellows, MD;  Location: [facility] ENDOSCOPY;  Service: Gastroenterology;  Laterality: N/A;   COLONOSCOPY WITH PROPOFOL N/A 02/26/2018   Procedure: COLONOSCOPY WITH PROPOFOL;  Surgeon: Jonathon Bellows, MD;  Location: [facility] ENDOSCOPY;  Service: Gastroenterology;  Laterality: N/A;   ESOPHAGOGASTRODUODENOSCOPY (EGD) WITH PROPOFOL N/A 01/18/2019   Procedure: ESOPHAGOGASTRODUODENOSCOPY (EGD) WITH PROPOFOL;  Surgeon: Lucilla Lame, MD;  Location: [facility] ENDOSCOPY;  Service: Endoscopy;  Laterality: N/A;   FOREIGN BODY REMOVAL N/A 03/07/2018   Procedure: FOREIGN BODY REMOVAL;  Surgeon: Jonathon Bellows, MD;  Location: [facility] ENDOSCOPY;  Service: Gastroenterology;  Laterality: N/A;   LEFT HEART CATH AND CORS/GRAFTS ANGIOGRAPHY N/A 08/07/2017   Procedure: LEFT HEART CATH AND CORS/GRAFTS ANGIOGRAPHY;  Surgeon: Yolonda Kida, MD;  Location: [place] CV LAB;  Service: Cardiovascular;  Laterality: N/A;   LEFT HEART CATH AND CORS/GRAFTS ANGIOGRAPHY N/A 11/28/2019   Procedure: LEFT HEART CATH AND CORS/GRAFTS ANGIOGRAPHY;  Surgeon: Teodoro Spray, MD;  Location: [place] CV LAB;  Service: Cardiovascular;  Laterality: N/A;   LEFT HEART CATH AND CORS/GRAFTS ANGIOGRAPHY N/A 04/15/2021   Procedure: LEFT HEART CATH AND CORS/GRAFTS ANGIOGRAPHY;  Surgeon: Corey Skains, MD;  Location: [place] CV LAB;  Service: Cardiovascular;  Laterality: N/A;   PROSTATE SURGERY     SHOULDER ARTHROSCOPY       Home Meds: Prior to Admission medications  Medication Sig Start Date End Date Taking? Authorizing Provider  aspirin EC 81 MG tablet Take 81 mg by mouth daily. 02/28/22  Yes [provider]  atorvastatin (LIPITOR) 80  MG tablet Take 80 mg by mouth daily.    Yes [provider]  clopidogrel (PLAVIX) 75 MG tablet Take 75 mg by mouth daily. 02/28/22  Yes [provider]  FARXIGA 10 MG TABS tablet Take 10 mg by mouth daily. 02/05/21  Yes [provider]  HUMALOG KWIKPEN 100 UNIT/ML KwikPen Inject 4 Units into the skin 3 (three) times daily. 08/19/21  Yes [provider]  insulin glargine (LANTUS) 100 UNIT/ML Solostar Pen Inject 20 Units into the skin at bedtime. 04/15/21  Yes Loletha Grayer, MD  isosorbide mononitrate (IMDUR) 60 MG 24 hr tablet Take 1 tablet (60 mg total) by mouth daily. 10/29/21 03/26/22 Yes Nolberto Hanlon, MD  ketorolac (ACULAR) 0.5 % ophthalmic solution SMARTSIG:In Eye(s) 09/23/21  Yes [provider]  lisinopril (ZESTRIL) 10 MG tablet Take 1 tablet by mouth daily. 08/19/21  Yes [provider]  metoprolol tartrate (LOPRESSOR) 25 MG tablet Take 1 tablet (25 mg total) by mouth 2 (two) times daily. 11/29/19  Yes Jennye Boroughs, MD  pantoprazole (PROTONIX) 40 MG tablet Take 1 tablet (40 mg total) by mouth daily. 01/18/19 03/26/22 Yes Lucilla Lame, MD  blood glucose meter kit and supplies Dispense based on patient and insurance preference. Use up to four times daily as directed. (FOR ICD-10 E10.9, E11.9). 11/29/19   Jennye Boroughs, MD  Insulin Pen Needle 32G X 4 MM MISC 1 application by Does not apply route at bedtime. 11/29/19   Jennye Boroughs, MD  nitroGLYCERIN (NITROSTAT) 0.4 MG SL tablet Place 1 tablet (0.4 mg total) under the tongue every 5 (five) minutes as needed for chest pain. 01/10/21   Paulette Blanch, MD    Inpatient Medications:   aspirin EC  81 mg Oral Daily   atorvastatin  80 mg Oral Daily   clopidogrel  75 mg Oral Daily   dapagliflozin propanediol  10 mg Oral Daily   insulin aspart  0-15 Units Subcutaneous TID WC   insulin aspart  0-5 Units Subcutaneous QHS   insulin glargine-yfgn  20 Units Subcutaneous QHS   isosorbide mononitrate  60 mg  Oral Daily   lisinopril  10 mg Oral Daily   metoprolol tartrate  25 mg Oral BID   pantoprazole  40 mg Oral Daily    0.9 % NaCl with KCl 20 mEq / L 100 mL/hr at 03/26/22 0958   heparin 1,400 Units/hr (03/26/22 0255)    Allergies: No Known Allergies  Social History   Socioeconomic History   Marital status: Single    Spouse name: Not on file   Number of children: Not on file   Years of education: Not on file   Highest education level: Not on file  Occupational History   Not on file  Tobacco Use   Smoking status: Never   Smokeless tobacco: Current    Types: Chew  Vaping Use   Vaping Use: Never used  Substance and Sexual Activity   Alcohol use: No   Drug use: Not Currently   Sexual activity: Not on file  Other Topics Concern   Not on file  Social History Narrative   Not on file   Social Determinants of Health   Financial Resource Strain: Not on file  Food Insecurity: No Food Insecurity (03/26/2022)   Hunger Vital Sign  Worried About Charity fundraiser in the Last Year: Never true    [place] in the Last Year: Never true  Transportation Needs: No Transportation Needs (03/26/2022)   PRAPARE - Hydrologist (Medical): No    Lack of Transportation (Non-Medical): No  Physical Activity: Not on file  Stress: Not on file  Social Connections: Not on file  Intimate Partner Violence: Not At Risk (03/26/2022)   Humiliation, Afraid, Rape, and Kick questionnaire    Fear of Current or Ex-Partner: No    Emotionally Abused: No    Physically Abused: No    Sexually Abused: No     History reviewed. No pertinent family history.   Review of Systems Positive for chest pain Negative for: General:  chills, fever, night sweats or weight changes.  Cardiovascular: PND orthopnea syncope dizziness  Dermatological skin lesions rashes Respiratory: Cough congestion Urologic: Frequent urination urination at night and hematuria Abdominal: negative for  nausea, vomiting, diarrhea, bright red blood per rectum, melena, or hematemesis Neurologic: negative for visual changes, and/or hearing changes  All other systems reviewed and are otherwise negative except as noted above.  Labs: No results for input(s): "CKTOTAL", "CKMB", "TROPONINI" in the last 72 hours. Lab Results  Component Value Date   WBC 9.3 03/26/2022   HGB 11.7 (L) 03/26/2022   HCT 36.7 (L) 03/26/2022   MCV 87.8 03/26/2022   PLT 304 03/26/2022    Recent Labs  Lab 03/26/22 0118 03/26/22 0546  NA 138 138  K 3.3* 3.9  CL 110 110  CO2 19* 19*  BUN 16 16  CREATININE 1.48* 1.36*  CALCIUM 8.9 9.0  PROT 7.4  --   BILITOT 0.4  --   ALKPHOS 56  --   ALT 19  --   AST 16  --   GLUCOSE 185* 166*   Lab Results  Component Value Date   CHOL 334 (H) 10/27/2021   HDL 43 10/27/2021   LDLCALC 236 (H) 10/27/2021   TRIG 273 (H) 10/27/2021   No results found for: "DDIMER"  Radiology/Studies:  DG Chest Port 1 View  Result Date: 03/26/2022 CLINICAL DATA:  Chest pain EXAM: PORTABLE CHEST 1 VIEW COMPARISON:  10/26/2021 FINDINGS: Cardiac shadow is enlarged but stable. Postsurgical changes are noted. Lungs are clear bilaterally. No acute bony abnormality is noted. IMPRESSION: No active disease. Electronically Signed   By: Inez Catalina M.D.   On: 03/26/2022 01:42    EKG: Normal sinus rhythm with frequent preventricular contractions and inferior possible anterior myocardial infarction age undetermined  Weights: Filed Weights   03/26/22 0110 03/26/22 1332  Weight: 122.7 kg 122.3 kg     Physical Exam: Blood pressure (!) 110/59, pulse (!) 58, temperature 97.7 F (36.5 C), temperature source Oral, resp. rate 20, weight 122.3 kg, SpO2 94 %. Body mass index is 41 kg/m. General: Well developed, well nourished, in no acute distress. Head eyes ears nose throat: Normocephalic, atraumatic, sclera non-icteric, no xanthomas, nares are without discharge. No apparent thyromegaly and/or  mass  Lungs: Normal respiratory effort.  no wheezes, no rales, no rhonchi.  Heart: RRR with normal S1 S2. no murmur gallop, no rub, PMI is normal size and placement, carotid upstroke normal without bruit, jugular venous pressure is normal Abdomen: Soft, non-tender, non-distended with normoactive bowel sounds. No hepatomegaly. No rebound/guarding. No obvious abdominal masses. Abdominal aorta is normal size without bruit Extremities: No edema. no cyanosis, no clubbing, no ulcers  Peripheral : 2+  bilateral upper extremity pulses, 2+ bilateral femoral pulses, 2+ bilateral dorsal pedal pulse Neuro: Alert and oriented. No facial asymmetry. No focal deficit. Moves all extremities spontaneously. Musculoskeletal: Normal muscle tone without kyphosis Psych:  Responds to questions appropriately with a normal affect.    Assessment: 76 year old male with known coronary disease status post coronary bypass graft in the past hypertension hyperlipidemia diabetes and patent grafts by cardiac catheterization last year with some small vessel stenosis medically manage appropriately without current evidence of congestive heart failure or myocardial infarction  Plan: 1.  Continuation of appropriate medication management for coronary artery disease status post coronary bypass graft and angina including metoprolol isosorbide 2.  Continuation of dual antiplatelet therapy for which the patient has been diligent in its use 3.  No further cardiac diagnostics necessary at this time due to no evidence of myocardial infarction or congestive heart failure but would consider the possibility of Lexiscan infusion stress test in a.m. if further symptoms.  If patient ambulating well without any further symptoms would be okay for discharged home from the cardiac standpoint with follow-up next week and further adjustments of medications or outpatient stress test if needed  Signed, Corey Skains M.D. [facility]  Cardiology 03/26/2022, 1:42 PM

## 2022-03-26 NOTE — ED Notes (Signed)
Patient is resting comfortably with eyes closed. VSS, NAD. WCTM. Call light in reach.

## 2022-03-26 NOTE — Consult Note (Signed)
ANTICOAGULATION CONSULT NOTE  Pharmacy Consult for Heparin Indication: chest pain/ACS  No Known Allergies  Patient Measurements: Weight: 122.3 kg (269 lb 10 oz) Heparin Dosing Weight: 96.7 kg  Vital Signs: Temp: 97.7 F (36.5 C) (10/25 1332) Temp Source: Oral (10/25 1332) BP: 110/59 (10/25 1332) Pulse Rate: 58 (10/25 1332)  Labs: Recent Labs    03/26/22 0118 03/26/22 0250 03/26/22 0546 03/26/22 1201  HGB 11.7*  --  11.7*  --   HCT 36.0*  --  36.7*  --   PLT 291  --  304  --   APTT  --  31  --   --   LABPROT  --  13.7  --   --   INR  --  1.1  --   --   HEPARINUNFRC  --   --   --  0.45  CREATININE 1.48*  --  1.36*  --   TROPONINIHS 60* 65*  --   --      Estimated Creatinine Clearance: 59.7 mL/min (A) (by C-G formula based on SCr of 1.36 mg/dL (H)).   Medical History: Past Medical History:  Diagnosis Date   CAD (coronary artery disease)    CKD (chronic kidney disease)    Diabetes mellitus without complication (HCC)    GERD (gastroesophageal reflux disease)    Hyperlipemia    Hypertension     Medications:  No history of chronic anticoagulant use PTA  Assessment: Pharmacy has been consulted to initiate and dose heparin infusion in 76yo patient with history of CAD s/p stent placement who presents to the ED with sharp left-sided, nonradiating chest pain which began on the evening of 10/24. Initial troponin level of 60.  Baseline labs: aPTT 31 sec, INR 1.1, Hgb 11.7, Plts 291  Date/Time HL Rate/Comment 10/25 1201 0.45 1400 units/hr / therapeutic x 1  Goal of Therapy:  Heparin level 0.3-0.7 units/ml Monitor platelets by anticoagulation protocol: Yes   Plan: heparin level is therapeutic x 1 Continue heparin infusion at 1400 units/hr Check anti-Xa level in 8 hours for confirmatory level as well as daily while on heparin Continue to monitor H&H and platelets  Wynelle Cleveland 03/26/2022,1:46 PM

## 2022-03-26 NOTE — Assessment & Plan Note (Signed)
-   We will continue antihypertensives. 

## 2022-03-26 NOTE — Assessment & Plan Note (Addendum)
-   The patient will be placed in an observation cardiac telemetry bed. - We will continue aspirin and Plavix. - We will follow serial troponins. - We will continue on IV heparin especially given elevated troponin I. - We will place him on  as needed sublingual nitroglycerin and IV morphine sulfate for pain. - He had an inch Nitropaste and his Imdur will be continued. - Cardiology consult will be obtained. - I notified Dr. Nehemiah Massed about the patient.

## 2022-03-26 NOTE — Assessment & Plan Note (Addendum)
-   The patient will be placed on supplemental coverage with NovoLog. - We will continue basal coverage. - We will continue [place].

## 2022-03-26 NOTE — Progress Notes (Signed)
No charge progress note.  Justin Keith is a 76 y.o. African-American male with medical history significant for coronary artery disease, CKD, type 2 diabetes mellitus, GERD, hypertension dyslipidemia, who presented to the emergency room with acute onset of left-sided chest pain that started about half an hour before she presented to the ER.  The patient describes his pain as chest tightness and graded 10/10 in severity with no nausea or vomiting or diaphoresis.  He denies any dyspnea or palpitations or radiation of his pain.  ED Course: Upon presentation to the emergency room, he was bradycardic with a heart rate of 31-54 and otherwise normal vital signs.  Labs revealed hypokalemia of 3.3 and blood glucose of 185 with CO2 of 19 and creatinine 1.48 comparable to previous levels.  CBC showed hemoglobin 11.7 and hematocrit 36 with normal coag profile. EKG as reviewed by me : EKG showed normal sinus rhythm with a rate of 62 with first-degree AV block and PVCs in a pattern of bigeminy. Imaging: Portable chest x-ray showed no acute cardiopulmonary disease.   The patient was given 2 sublingual nitroglycerin sprays and 4 baby aspirin with resolution of pain, 40  M EQ p.o. potassium chloride, 1 inch of Nitropaste and IV heparin bolus and drip.  Cardiology was consulted but has not seen the patient yet.  Chest pain with some improvement but remained at 4/10, feels like pressure, nonradiating and no other associated symptoms.  Troponin barely positive at 60 with a flat curve. His physical exam was benign except morbid obesity.  We will continue on current management and let the cardiology decide about heparin infusion.  Less likely ACS.

## 2022-03-26 NOTE — ED Notes (Signed)
ED Provider at bedside. 

## 2022-03-26 NOTE — Assessment & Plan Note (Signed)
-  We will replace potassium and check magnesium level. 

## 2022-03-26 NOTE — H&P (Signed)
[place]   PATIENT NAME: Justin Keith    MR#:  [id number]  DATE OF BIRTH:  1945/06/18  DATE OF ADMISSION:  03/26/2022  PRIMARY CARE PHYSICIAN: [facility] [place]   Patient is coming from: Home  REQUESTING/REFERRING PHYSICIAN: Lurline Hare, MD  CHIEF COMPLAINT:   Chief Complaint  Patient presents with   Chest Pain    HISTORY OF PRESENT ILLNESS:  Justin Keith is a 76 y.o. African-American male with medical history significant for coronary artery disease, CKD, type 2 diabetes mellitus, GERD, hypertension dyslipidemia, who presented to the emergency room with acute onset of left-sided chest pain that started about half an hour before she presented to the ER.  The patient describes his pain as chest tightness and graded 10/10 in severity with no nausea or vomiting or diaphoresis.  He denies any dyspnea or palpitations or radiation of his pain.  No leg pain or edema or recent travels or surgeries.  No fever or chills.  No cough or wheezing or hemoptysis.  No dysuria, oliguria or hematuria or flank pain.  No bleeding diathesis.  ED Course: Upon presentation to the emergency room, he was bradycardic with a heart rate of 31-54 and otherwise normal vital signs.  Labs revealed hypokalemia of 3.3 and blood glucose of 185 with CO2 of 19 and creatinine 1.48 comparable to previous levels.  CBC showed hemoglobin 11.7 and hematocrit 36 with normal coag profile. EKG as reviewed by me : EKG showed normal sinus rhythm with a rate of 62 with first-degree AV block and PVCs in a pattern of bigeminy. Imaging: Portable chest x-ray showed no acute cardiopulmonary disease.  The patient was given 2 sublingual nitroglycerin sprays and 4 baby aspirin with resolution of pain, 40  M EQ p.o. potassium chloride, 1 inch of Nitropaste and IV heparin bolus and drip.  He will be admitted to an observation cardiac telemetry bed for further evaluation and management. PAST MEDICAL HISTORY:    Past Medical History:  Diagnosis Date   CAD (coronary artery disease)    CKD (chronic kidney disease)    Diabetes mellitus without complication (HCC)    GERD (gastroesophageal reflux disease)    Hyperlipemia    Hypertension     PAST SURGICAL HISTORY:   Past Surgical History:  Procedure Laterality Date   CARDIAC CATHETERIZATION     CARDIAC SURGERY     COLONOSCOPY WITH PROPOFOL N/A 02/25/2018   Procedure: COLONOSCOPY WITH PROPOFOL;  Surgeon: Jonathon Bellows, MD;  Location[facility] ENDOSCOPY;  Service: Gastroenterology;  Laterality: N/A;   COLONOSCOPY WITH PROPOFOL N/A 02/26/2018   Procedure: COLONOSCOPY WITH PROPOFOL;  Surgeon: Jonathon Bellows, MD;  Location: [facility] ENDOSCOPY;  Service: Gastroenterology;  Laterality: N/A;   ESOPHAGOGASTRODUODENOSCOPY (EGD) WITH PROPOFOL N/A 01/18/2019   Procedure: ESOPHAGOGASTRODUODENOSCOPY (EGD) WITH PROPOFOL;  Surgeon: Lucilla Lame, MD;  Location: [facility] ENDOSCOPY;  Service: Endoscopy;  Laterality: N/A;   FOREIGN BODY REMOVAL N/A 03/07/2018   Procedure: FOREIGN BODY REMOVAL;  Surgeon: Jonathon Bellows, MD;  Location: [facility] ENDOSCOPY;  Service: Gastroenterology;  Laterality: N/A;   LEFT HEART CATH AND CORS/GRAFTS ANGIOGRAPHY N/A 08/07/2017   Procedure: LEFT HEART CATH AND CORS/GRAFTS ANGIOGRAPHY;  Surgeon: Yolonda Kida, MD;  Location: [place] CV LAB;  Service: Cardiovascular;  Laterality: N/A;   LEFT HEART CATH AND CORS/GRAFTS ANGIOGRAPHY N/A 11/28/2019   Procedure: LEFT HEART CATH AND CORS/GRAFTS ANGIOGRAPHY;  Surgeon: Teodoro Spray, MD;  Location: [place] CV LAB;  Service: Cardiovascular;  Laterality:  N/A;   LEFT HEART CATH AND CORS/GRAFTS ANGIOGRAPHY N/A 04/15/2021   Procedure: LEFT HEART CATH AND CORS/GRAFTS ANGIOGRAPHY;  Surgeon: Corey Skains, MD;  Location: [place] CV LAB;  Service: Cardiovascular;  Laterality: N/A;   PROSTATE SURGERY     SHOULDER ARTHROSCOPY      SOCIAL HISTORY:   Social History   Tobacco Use   Smoking status:  Never   Smokeless tobacco: Current    Types: Chew  Substance Use Topics   Alcohol use: No    FAMILY HISTORY:  The patient's brother died from MI at age of 23.  The history is otherwise positive for cancer and diabetes mellitus as well as coronary artery disease with other family members.  DRUG ALLERGIES:  No Known Allergies  REVIEW OF SYSTEMS:   ROS As per history of present illness. All pertinent systems were reviewed above. Constitutional, HEENT, cardiovascular, respiratory, GI, GU, musculoskeletal, neuro, psychiatric, endocrine, integumentary and hematologic systems were reviewed and are otherwise negative/unremarkable except for positive findings mentioned above in the HPI.   MEDICATIONS AT HOME:   Prior to Admission medications   Medication Sig Start Date End Date Taking? Authorizing Provider  aspirin EC 81 MG tablet Take 81 mg by mouth daily. 02/28/22  Yes [provider]  atorvastatin (LIPITOR) 80 MG tablet Take 80 mg by mouth daily.    Yes [provider]  clopidogrel (PLAVIX) 75 MG tablet Take 75 mg by mouth daily. 02/28/22  Yes [provider]  FARXIGA 10 MG TABS tablet Take 10 mg by mouth daily. 02/05/21  Yes [provider]  HUMALOG KWIKPEN 100 UNIT/ML KwikPen Inject 4 Units into the skin 3 (three) times daily. 08/19/21  Yes [provider]  insulin glargine (LANTUS) 100 UNIT/ML Solostar Pen Inject 20 Units into the skin at bedtime. 04/15/21  Yes Loletha Grayer, MD  isosorbide mononitrate (IMDUR) 60 MG 24 hr tablet Take 1 tablet (60 mg total) by mouth daily. 10/29/21 03/26/22 Yes Nolberto Hanlon, MD  ketorolac (ACULAR) 0.5 % ophthalmic solution SMARTSIG:In Eye(s) 09/23/21  Yes [provider]  lisinopril (ZESTRIL) 10 MG tablet Take 1 tablet by mouth daily. 08/19/21  Yes [provider]  metoprolol tartrate (LOPRESSOR) 25 MG tablet Take 1 tablet (25 mg total) by mouth 2 (two) times daily. 11/29/19  Yes Jennye Boroughs, MD   pantoprazole (PROTONIX) 40 MG tablet Take 1 tablet (40 mg total) by mouth daily. 01/18/19 03/26/22 Yes Lucilla Lame, MD  blood glucose meter kit and supplies Dispense based on patient and insurance preference. Use up to four times daily as directed. (FOR ICD-10 E10.9, E11.9). 11/29/19   Jennye Boroughs, MD  Insulin Pen Needle 32G X 4 MM MISC 1 application by Does not apply route at bedtime. 11/29/19   Jennye Boroughs, MD  nitroGLYCERIN (NITROSTAT) 0.4 MG SL tablet Place 1 tablet (0.4 mg total) under the tongue every 5 (five) minutes as needed for chest pain. 01/10/21   Paulette Blanch, MD      VITAL SIGNS:  Blood pressure (!) 152/68, pulse 65, temperature 98.4 F (36.9 C), temperature source Oral, resp. rate 18, weight 122.7 kg, SpO2 98 %.  PHYSICAL EXAMINATION:  Physical Exam  GENERAL:  76 y.o.-year-old African-American male patient lying in the bed with no acute distress.  EYES: Pupils equal, round, reactive to light and accommodation. No scleral icterus. Extraocular muscles intact.  HEENT: Head atraumatic, normocephalic. Oropharynx and nasopharynx clear.  NECK:  Supple, no jugular venous distention. No thyroid enlargement,  no tenderness.  LUNGS: Normal breath sounds bilaterally, no wheezing, rales,rhonchi or crepitation. No use of accessory muscles of respiration.  CARDIOVASCULAR: Regular rate and rhythm, S1, S2 normal. No murmurs, rubs, or gallops.  ABDOMEN: Soft, nondistended, nontender. Bowel sounds present. No organomegaly or mass.  EXTREMITIES: No pedal edema, cyanosis, or clubbing.  NEUROLOGIC: Cranial nerves II through XII are intact. Muscle strength 5/5 in all extremities. Sensation intact. Gait not checked.  PSYCHIATRIC: The patient is alert and oriented x 3.  Normal affect and good eye contact. SKIN: No obvious rash, lesion, or ulcer.   LABORATORY PANEL:   CBC Recent Labs  Lab 03/26/22 0118  WBC 8.9  HGB 11.7*  HCT 36.0*  PLT 291    ------------------------------------------------------------------------------------------------------------------  Chemistries  Recent Labs  Lab 03/26/22 0118  NA 138  K 3.3*  CL 110  CO2 19*  GLUCOSE 185*  BUN 16  CREATININE 1.48*  CALCIUM 8.9  AST 16  ALT 19  ALKPHOS 56  BILITOT 0.4   ------------------------------------------------------------------------------------------------------------------  Cardiac Enzymes No results for input(s): "TROPONINI" in the last 168 hours. ------------------------------------------------------------------------------------------------------------------  RADIOLOGY:  DG Chest Port 1 View  Result Date: 03/26/2022 CLINICAL DATA:  Chest pain EXAM: PORTABLE CHEST 1 VIEW COMPARISON:  10/26/2021 FINDINGS: Cardiac shadow is enlarged but stable. Postsurgical changes are noted. Lungs are clear bilaterally. No acute bony abnormality is noted. IMPRESSION: No active disease. Electronically Signed   By: Inez Catalina M.D.   On: 03/26/2022 01:42      IMPRESSION AND PLAN:  Assessment and Plan: * Chest pain - The patient will be placed in an observation cardiac telemetry bed. - We will continue aspirin and Plavix. - We will follow serial troponins. - We will continue on IV heparin especially given elevated troponin I. - We will place him on  as needed sublingual nitroglycerin and IV morphine sulfate for pain. - He had an inch Nitropaste and his Imdur will be continued. - Cardiology consult will be obtained. - I notified Dr. Nehemiah Massed about the patient.  Hypokalemia - We will replace potassium and check magnesium level.  Dyslipidemia - We will continue statin therapy with high-dose statin given elevated troponin.  Essential hypertension - We will continue antihypertensives.  Type 2 diabetes mellitus without complication, with long-term current use of insulin ([place]) - The patient will be placed on supplemental coverage with NovoLog. - We will  continue basal coverage. - We will continue [place].  Coronary artery disease We will continue aspirin and Plavix as well as Imdur and ACE inhibitor therapy with beta-blocker therapy.    DVT prophylaxis: Lovenox.  Advanced Care Planning:  Code Status: full code.  Family Communication:  The plan of care was discussed in details with the patient (and family). I answered all questions. The patient agreed to proceed with the above mentioned plan. Further management will depend upon hospital course. Disposition Plan: Back to previous home environment Consults called: Cardiology All the records are reviewed and case discussed with ED provider.  Status is: Observation   I certify that at the time of admission, it is my clinical judgment that the patient will require hospital care extending less than 2 midnights.                            Dispo: The patient is from: Home              Anticipated d/c is to: Home  Patient currently is not medically stable to d/c.              Difficult to place patient: No  Christel Mormon M.D on 03/26/2022 at 4:25 AM  [facility]   From 7 PM-7 AM, contact night-coverage [email or web address]  CC: Primary care physician; [facility] [place]

## 2022-03-26 NOTE — ED Notes (Signed)
Assumed care of pt. Pt aaox4. Pt ambulates to restroom with steady gait, vital signs stable

## 2022-03-26 NOTE — Assessment & Plan Note (Addendum)
-   We will continue statin therapy with high-dose statin given elevated troponin.

## 2022-03-26 NOTE — Assessment & Plan Note (Signed)
We will continue aspirin and Plavix as well as Imdur and ACE inhibitor therapy with beta-blocker therapy.

## 2022-03-26 NOTE — ED Triage Notes (Signed)
Pt coming from home via [facility] c/o sharp left sided chest pain that started about 30 mins ago. Pt was laying in bed when pain started. Pt given 324 aspirin, 2 sprays of nitro

## 2022-03-26 NOTE — Consult Note (Signed)
ANTICOAGULATION CONSULT NOTE  Pharmacy Consult for Heparin Indication: chest pain/ACS  No Known Allergies  Patient Measurements: Weight: 122.7 kg (270 lb 8.1 oz) Heparin Dosing Weight: 96.7 kg  Vital Signs: Temp: 98.3 F (36.8 C) (10/25 0125) Temp Source: Oral (10/25 0125) BP: 124/63 (10/25 0230) Pulse Rate: 54 (10/25 0230)  Labs: Recent Labs    03/26/22 0118  HGB 11.7*  HCT 36.0*  PLT 291  CREATININE 1.48*  TROPONINIHS 60*    Estimated Creatinine Clearance: 55 mL/min (A) (by C-G formula based on SCr of 1.48 mg/dL (H)).   Medical History: Past Medical History:  Diagnosis Date   CAD (coronary artery disease)    CKD (chronic kidney disease)    Diabetes mellitus without complication (HCC)    GERD (gastroesophageal reflux disease)    Hyperlipemia    Hypertension     Medications:  No history of chronic anticoagulant use PTA  Assessment: Pharmacy has been consulted to initiate and dose heparin infusion in 76yo patient with history of CAD s/p stent placement who presents to the ED with sharp left-sided, nonradiating chest pain which began on the evening of 10/24. Initial troponin level of 60.  Baseline labs: aPTT 31 sec, INR 1.1, Hgb 11.7, Plts 291  Goal of Therapy:  Heparin level 0.3-0.7 units/ml Monitor platelets by anticoagulation protocol: Yes   Plan:  Give 4000 units bolus x 1 Start heparin infusion at 1400 units/hr Check anti-Xa level in 8 hours and daily while on heparin Continue to monitor H&H and platelets  Sahir Tolson A Iyahna Obriant 03/26/2022,2:48 AM

## 2022-03-26 NOTE — ED Provider Notes (Signed)
[facility] Provider Note    Event Date/Time   First MD Initiated Contact with Patient 03/26/22 0110     (approximate)   History   Chest Pain   HPI  Justin Keith is a 76 y.o. male brought to the ED via EMS from home with a chief complaint of chest pain.  Patient with a history of CAD status post stents who endorses sharp left-sided, nonradiating chest pain which began at rest approximately 30 minutes ago.  Denies associated diaphoresis, nausea/vomiting, palpitations or dizziness.  Patient given 324 mg aspirin and 2 sprays of Nitropaste and endorses improvement in pain.  States pain feels similar when he had his heart attacks in the past.  Denies recent fever, cough, abdominal pain, dysuria or diarrhea.  Denies calf pain or swelling.     Past Medical History   Past Medical History:  Diagnosis Date   CAD (coronary artery disease)    CKD (chronic kidney disease)    Diabetes mellitus without complication (HCC)    GERD (gastroesophageal reflux disease)    Hyperlipemia    Hypertension      Active Problem List   Patient Active Problem List   Diagnosis Date Noted   ACS (acute coronary syndrome) ([place]) 10/26/2021   Obesity, Class III, BMI 40-49.9 (morbid obesity) ([place]) 10/26/2021   Unstable angina (HCC)    Hyperglycemia due to type 2 diabetes mellitus ([place]) 04/13/2021   Stage 3b chronic kidney disease ([place]) 04/13/2021   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   Elevated troponin level    Stage 3a chronic kidney disease (HCC)    Hyponatremia    Cardiomyopathy (HCC)    Acute non-ST elevation myocardial infarction (NSTEMI) ([place]) 11/26/2019   NSTEMI (non-ST elevated myocardial infarction) ([place]) 11/25/2019   AKI (acute kidney injury) ([place]) 11/25/2019   Angina pectoris ([place]) 03/15/2019   Problems with swallowing and mastication    Chest pain in adult 10/22/2018   Essential hypertension 09/15/2018   HLD (hyperlipidemia) 09/15/2018   GERD (gastroesophageal reflux  disease) 09/15/2018   CAD (coronary artery disease) 09/15/2018   Uncontrolled secondary diabetes mellitus with stage 3 CKD (GFR 30-59) 09/15/2018   Chest pain 12/07/2015     Past Surgical History   Past Surgical History:  Procedure Laterality Date   CARDIAC CATHETERIZATION     CARDIAC SURGERY     COLONOSCOPY WITH PROPOFOL N/A 02/25/2018   Procedure: COLONOSCOPY WITH PROPOFOL;  Surgeon: Jonathon Bellows, MD;  Location: [facility] ENDOSCOPY;  Service: Gastroenterology;  Laterality: N/A;   COLONOSCOPY WITH PROPOFOL N/A 02/26/2018   Procedure: COLONOSCOPY WITH PROPOFOL;  Surgeon: Jonathon Bellows, MD;  Location: [facility] ENDOSCOPY;  Service: Gastroenterology;  Laterality: N/A;   ESOPHAGOGASTRODUODENOSCOPY (EGD) WITH PROPOFOL N/A 01/18/2019   Procedure: ESOPHAGOGASTRODUODENOSCOPY (EGD) WITH PROPOFOL;  Surgeon: Lucilla Lame, MD;  Location: [facility] ENDOSCOPY;  Service: Endoscopy;  Laterality: N/A;   FOREIGN BODY REMOVAL N/A 03/07/2018   Procedure: FOREIGN BODY REMOVAL;  Surgeon: Jonathon Bellows, MD;  Location: [facility] ENDOSCOPY;  Service: Gastroenterology;  Laterality: N/A;   LEFT HEART CATH AND CORS/GRAFTS ANGIOGRAPHY N/A 08/07/2017   Procedure: LEFT HEART CATH AND CORS/GRAFTS ANGIOGRAPHY;  Surgeon: Yolonda Kida, MD;  Location: [place] CV LAB;  Service: Cardiovascular;  Laterality: N/A;   LEFT HEART CATH AND CORS/GRAFTS ANGIOGRAPHY N/A 11/28/2019   Procedure: LEFT HEART CATH AND CORS/GRAFTS ANGIOGRAPHY;  Surgeon: Teodoro Spray, MD;  Location: [place] CV LAB;  Service: Cardiovascular;  Laterality: N/A;   LEFT HEART CATH AND CORS/GRAFTS ANGIOGRAPHY  N/A 04/15/2021   Procedure: LEFT HEART CATH AND CORS/GRAFTS ANGIOGRAPHY;  Surgeon: Corey Skains, MD;  Location: [place] CV LAB;  Service: Cardiovascular;  Laterality: N/A;   PROSTATE SURGERY     SHOULDER ARTHROSCOPY       Home Medications   Prior to Admission medications   Medication Sig Start Date End Date Taking? Authorizing Provider  aspirin 81 MG  chewable tablet Chew 1 tablet (81 mg total) by mouth daily. 12/08/15   Vaughan Basta, MD  atorvastatin (LIPITOR) 80 MG tablet Take 80 mg by mouth daily.     [provider]  blood glucose meter kit and supplies Dispense based on patient and insurance preference. Use up to four times daily as directed. (FOR ICD-10 E10.9, E11.9). 11/29/19   Jennye Boroughs, MD  FARXIGA 10 MG TABS tablet Take 10 mg by mouth daily. 02/05/21   [provider]  Cleda Clarks 100 UNIT/ML KwikPen  08/19/21   [provider]  insulin aspart (NOVOLOG) 100 UNIT/ML FlexPen 4 units subcutaneous injection three times a day prior to meals (okay to substitute any generic short acting insulin that insurance covers) 04/15/21   Loletha Grayer, MD  insulin glargine (LANTUS) 100 UNIT/ML Solostar Pen Inject 20 Units into the skin at bedtime. 04/15/21   Loletha Grayer, MD  Insulin Pen Needle 32G X 4 MM MISC 1 application by Does not apply route at bedtime. 11/29/19   Jennye Boroughs, MD  isosorbide mononitrate (IMDUR) 60 MG 24 hr tablet Take 1 tablet (60 mg total) by mouth daily. 10/29/21 11/28/21  Nolberto Hanlon, MD  ketorolac (ACULAR) 0.5 % ophthalmic solution SMARTSIG:In Eye(s) 09/23/21   [provider]  lisinopril (ZESTRIL) 10 MG tablet Take 1 tablet by mouth daily. 08/19/21   [provider]  metoprolol tartrate (LOPRESSOR) 25 MG tablet Take 1 tablet (25 mg total) by mouth 2 (two) times daily. 11/29/19   Jennye Boroughs, MD  nitroGLYCERIN (NITROSTAT) 0.4 MG SL tablet Place 1 tablet (0.4 mg total) under the tongue every 5 (five) minutes as needed for chest pain. 01/10/21   Paulette Blanch, MD  pantoprazole (PROTONIX) 40 MG tablet Take 1 tablet (40 mg total) by mouth daily. 01/18/19 10/26/21  Lucilla Lame, MD     Allergies  Patient has no known allergies.   Family History  No family history on file.   Physical Exam  Triage Vital Signs: ED Triage Vitals [03/26/22 0110]  Enc Vitals Group      BP      Pulse      Resp      Temp      Temp src      SpO2      Weight 270 lb 8.1 oz (122.7 kg)     Height      Head Circumference      Peak Flow      Pain Score 10     Pain Loc      Pain Edu?      Excl. in [place]?     Updated Vital Signs: BP 124/63   Pulse (!) 54   Temp 98.3 F (36.8 C) (Oral)   Resp 20   Wt 122.7 kg   SpO2 94%   BMI 41.13 kg/m    General: Awake, no distress.  CV:  RRR.  Good peripheral perfusion.  Resp:  Normal effort.  CTA B. Abd:  Nontender.  No distention.  Other:  Bilateral calves are nontender and nonswollen.   ED  Results / Procedures / Treatments  Labs (all labs ordered are listed, but only abnormal results are displayed) Labs Reviewed  CBC WITH DIFFERENTIAL/PLATELET - Abnormal; Notable for the following components:      Result Value   RBC 4.05 (*)    Hemoglobin 11.7 (*)    HCT 36.0 (*)    All other components within normal limits  COMPREHENSIVE METABOLIC PANEL - Abnormal; Notable for the following components:   Potassium 3.3 (*)    CO2 19 (*)    Glucose, Bld 185 (*)    Creatinine, Ser 1.48 (*)    GFR, Estimated 49 (*)    All other components within normal limits  TROPONIN I (HIGH SENSITIVITY) - Abnormal; Notable for the following components:   Troponin I (High Sensitivity) 60 (*)    All other components within normal limits  LIPASE, BLOOD     EKG  ED ECG REPORT I, Ting Cage J, the attending physician, personally viewed and interpreted this ECG.   Date: 03/26/2022  EKG Time: 0123  Rate: 62  Rhythm: normal sinus rhythm  Axis: LAD  Intervals: Frequent PVCs  ST&T Change: Nonspecific    RADIOLOGY I have independently visualized and interpreted patient's chest x-ray as well as noted the radiology interpretation:  Chest x-ray: No acute cardiopulmonary process  Official radiology report(s): DG Chest Port 1 View  Result Date: 03/26/2022 CLINICAL DATA:  Chest pain EXAM: PORTABLE CHEST 1 VIEW COMPARISON:  10/26/2021  FINDINGS: Cardiac shadow is enlarged but stable. Postsurgical changes are noted. Lungs are clear bilaterally. No acute bony abnormality is noted. IMPRESSION: No active disease. Electronically Signed   By: Inez Catalina M.D.   On: 03/26/2022 01:42     PROCEDURES:  Critical Care performed: Yes, see critical care procedure note(s)  CRITICAL CARE Performed by: Paulette Blanch   Total critical care time: 30 minutes  Critical care time was exclusive of separately billable procedures and treating other patients.  Critical care was necessary to treat or prevent imminent or life-threatening deterioration.  Critical care was time spent personally by me on the following activities: development of treatment plan with patient and/or surrogate as well as nursing, discussions with consultants, evaluation of patient's response to treatment, examination of patient, obtaining history from patient or surrogate, ordering and performing treatments and interventions, ordering and review of laboratory studies, ordering and review of radiographic studies, pulse oximetry and re-evaluation of patient's condition.   Marland Kitchen1-3 Lead EKG Interpretation  Performed by: Paulette Blanch, MD Authorized by: Paulette Blanch, MD     Interpretation: abnormal     ECG rate:  54   ECG rate assessment: bradycardic     Rhythm: sinus bradycardia     Ectopy: none     Conduction: normal   Comments:     Patient placed on cardiac monitor to evaluate for arrhythmias    MEDICATIONS ORDERED IN ED: Medications  potassium chloride SA (KLOR-CON M) CR tablet 40 mEq (has no administration in time range)  heparin injection 4,000 Units (has no administration in time range)  heparin ADULT infusion 100 units/mL (25000 units/243mL) (has no administration in time range)  nitroGLYCERIN (NITROGLYN) 2 % ointment 1 inch (1 inch Topical Given 03/26/22 0128)     IMPRESSION / MDM / ASSESSMENT AND PLAN / ED COURSE  I reviewed the triage vital signs and the  nursing notes.  76 year old male presenting with chest pain. Differential diagnosis includes, but is not limited to, ACS, aortic dissection, pulmonary embolism, cardiac tamponade, pneumothorax, pneumonia, pericarditis, myocarditis, GI-related causes including esophagitis/gastritis, and musculoskeletal chest wall pain.   I have personally reviewed patient's records and note a nephrology office visit on 02/04/2022 for diabetes and CKD.  Patient's presentation is most consistent with acute presentation with potential threat to life or bodily function.  The patient is on the cardiac monitor to evaluate for evidence of arrhythmia and/or significant heart rate changes.  We will obtain cardiac panel, chest x-ray.  Apply nitroglycerin paste and reassess.  Clinical Course as of 03/26/22 0239  Wed Mar 26, 2022  0238 Updated patient on laboratory results notable for mild hypokalemia potassium 3.3, mildly elevated troponin.  Patient is chest pain-free at this time after application of nitroglycerin paste.  Will initiate heparin bolus with drip and consult hospital services for evaluation and admission. [JS]    Clinical Course User Index [JS] Paulette Blanch, MD     FINAL CLINICAL IMPRESSION(S) / ED DIAGNOSES   Final diagnoses:  Chest pain, unspecified type  Unstable angina ([place])     Rx / DC Orders   ED Discharge Orders     None        Note:  This document was prepared using Dragon voice recognition software and may include unintentional dictation errors.   Paulette Blanch, MD 03/26/22 [phone]

## 2022-03-26 NOTE — ED Notes (Addendum)
Pt reporting he is diabetic and his sugar hasn't been monitored. Will notify provider.   Pt is independently ambulatory to and from restroom without difficulty.

## 2022-03-27 DIAGNOSIS — R079 Chest pain, unspecified: Secondary | ICD-10-CM | POA: Diagnosis not present

## 2022-03-27 LAB — GLUCOSE, CAPILLARY
Glucose-Capillary: 152 mg/dL — ABNORMAL HIGH (ref 70–99)
Glucose-Capillary: 176 mg/dL — ABNORMAL HIGH (ref 70–99)

## 2022-03-27 NOTE — Discharge Summary (Signed)
Physician Discharge Summary   Patient: Justin Keith MRN: [id number] DOB: 07-Dec-1945  Admit date:     03/26/2022  Discharge date: 03/27/22  Discharge Physician: Val Riles   PCP: [facility] [place]   Recommendations at discharge:   Follow-up with PCP in 1 week Follow-up with cardiology in 1 to 2 weeks as an outpatient  Discharge Diagnoses: Principal Problem:   Chest pain Active Problems:   Hypokalemia   Essential hypertension   Dyslipidemia   Type 2 diabetes mellitus without complication, with long-term current use of insulin (HCC)   Coronary artery disease  Resolved Problems:   * No resolved hospital problems. *  Hospital Course: Assessment and Plan:  Justin Keith is a 76 y.o. African-American male with medical history significant for coronary artery disease, CKD, type 2 diabetes mellitus, GERD, hypertension dyslipidemia, who presented to the emergency room with acute onset of left-sided chest pain that started about half an hour before she presented to the ER.  The patient describes his pain as chest tightness and graded 10/10 in severity with no nausea or vomiting or diaphoresis.  He denies any dyspnea or palpitations or radiation of his pain.   ED Course: Upon presentation to the emergency room, he was bradycardic with a heart rate of 31-54 and otherwise normal vital signs.  Labs revealed hypokalemia of 3.3 and blood glucose of 185 with CO2 of 19 and creatinine 1.48 comparable to previous levels.  CBC showed hemoglobin 11.7 and hematocrit 36 with normal coag profile. EKG as reviewed by me : EKG showed normal sinus rhythm with a rate of 62 with first-degree AV block and PVCs in a pattern of bigeminy. Imaging: Portable chest x-ray showed no acute cardiopulmonary disease.   The patient was given 2 sublingual nitroglycerin sprays and 4 baby aspirin with resolution of pain, 40  M EQ p.o. potassium chloride, 1 inch of Nitropaste and IV heparin bolus and  drip.  # Chest pain  continue aspirin and Plavix. Troponin 60/65, remained flat. S/p IV heparin infusion and IV morphine was given for pain control.  Patient was also given Nitropaste and Imdur was continued. Cardiology was consulted, recommended no significant changes on the EKG and troponin remained flat, no ischemic work-up needed at this time.  Patient ambulated in the hallway and remained asymptomatic.  Chest pain resolved.  Cleared by cardiology to discharge home and follow-up as an outpatient in 1 to 2 weeks. # Hypokalemia, potassium was repleted and resolved. # Dyslipidemia, continue statin therapy # Essential hypertension, continue antihypertensives, home regimen. # Type 2 diabetes mellitus without complication, with long-term current use of insulin Resumed Home regimen, patient was advised to monitor FSBG at home and continue diabetic diet. # Coronary artery disease, continue aspirin and Plavix as well as Imdur and ACE inhibitor therapy with beta-blocker therapy. # CKD stage IIIa, creatinine 1.4 at-today 1.36 improved, patient was advised to continue oral hydration and follow with PCP, repeat BMP after 1 week. # Microcytic anemia, Hb 11.7, recommended to follow with PCP for anemia work-up as an outpatient.  Morbid obesity BMI 41, calorie restricted diet and daily exercise advised to lose body weight.   Consultants: Cardiology Procedures performed: None Disposition: Home Diet recommendation:  Discharge Diet Orders (From admission, onward)     Start     Ordered   03/27/22 0000  Diet - low sodium heart healthy        03/27/22 1141  Cardiac and Carb modified diet DISCHARGE MEDICATION: Allergies as of 03/27/2022   No Known Allergies      Medication List     TAKE these medications    aspirin EC 81 MG tablet Take 81 mg by mouth daily.   atorvastatin 80 MG tablet Commonly known as: LIPITOR Take 80 mg by mouth daily.   blood glucose meter kit and  supplies Dispense based on patient and insurance preference. Use up to four times daily as directed. (FOR ICD-10 E10.9, E11.9).   clopidogrel 75 MG tablet Commonly known as: PLAVIX Take 75 mg by mouth daily.   Farxiga 10 MG Tabs tablet Generic drug: dapagliflozin propanediol Take 10 mg by mouth daily.   HumaLOG KwikPen 100 UNIT/ML KwikPen Generic drug: insulin lispro Inject 4 Units into the skin 3 (three) times daily.   insulin glargine 100 UNIT/ML Solostar Pen Commonly known as: LANTUS Inject 20 Units into the skin at bedtime.   Insulin Pen Needle 32G X 4 MM Misc 1 application by Does not apply route at bedtime.   isosorbide mononitrate 60 MG 24 hr tablet Commonly known as: IMDUR Take 1 tablet (60 mg total) by mouth daily.   ketorolac 0.5 % ophthalmic solution Commonly known as: ACULAR SMARTSIG:In Eye(s)   lisinopril 10 MG tablet Commonly known as: ZESTRIL Take 1 tablet by mouth daily.   metoprolol tartrate 25 MG tablet Commonly known as: LOPRESSOR Take 1 tablet (25 mg total) by mouth 2 (two) times daily.   nitroGLYCERIN 0.4 MG SL tablet Commonly known as: NITROSTAT Place 1 tablet (0.4 mg total) under the tongue every 5 (five) minutes as needed for chest pain.   pantoprazole 40 MG tablet Commonly known as: Protonix Take 1 tablet (40 mg total) by mouth daily.        Follow-up Information     Yolonda Kida, MD. Go in 1 week(s).   Specialties: Cardiology, Internal Medicine Contact information: [address] [place] [phone]                Discharge Exam: Danley Danker Weights   03/26/22 0110 03/26/22 1332  Weight: 122.7 kg 122.3 kg     Condition at discharge: good  The results of significant diagnostics from this hospitalization (including imaging, microbiology, ancillary and laboratory) are listed below for reference.   Imaging Studies: DG Chest Port 1 View  Result Date: 03/26/2022 CLINICAL DATA:  Chest pain EXAM:  PORTABLE CHEST 1 VIEW COMPARISON:  10/26/2021 FINDINGS: Cardiac shadow is enlarged but stable. Postsurgical changes are noted. Lungs are clear bilaterally. No acute bony abnormality is noted. IMPRESSION: No active disease. Electronically Signed   By: Inez Catalina M.D.   On: 03/26/2022 01:42    Microbiology: Results for orders placed or performed during the hospital encounter of 04/12/21  Resp Panel by RT-PCR (Flu A&B, Covid) Nasopharyngeal Swab     Status: None   Collection Time: 04/13/21  1:34 AM   Specimen: Nasopharyngeal Swab; Nasopharyngeal(NP) swabs in vial transport medium  Result Value Ref Range Status   SARS Coronavirus 2 by RT PCR NEGATIVE NEGATIVE Final    Comment: (NOTE) SARS-CoV-2 target nucleic acids are NOT DETECTED.  The SARS-CoV-2 RNA is generally detectable in upper respiratory specimens during the acute phase of infection. The lowest concentration of SARS-CoV-2 viral copies this assay can detect is 138 copies/mL. A negative result does not preclude SARS-Cov-2 infection and should not be used as the sole basis for treatment or other patient management decisions.  A negative result may occur with  improper specimen collection/handling, submission of specimen other than nasopharyngeal swab, presence of viral mutation(s) within the areas targeted by this assay, and inadequate number of viral copies(<138 copies/mL). A negative result must be combined with clinical observations, patient history, and epidemiological information. The expected result is Negative.  Fact Sheet for Patients:  EntrepreneurPulse.com.au  Fact Sheet for Healthcare Providers:  IncredibleEmployment.be  This test is no t yet approved or cleared by the [place] FDA and  has been authorized for detection and/or diagnosis of SARS-CoV-2 by FDA under an Emergency Use Authorization (EUA). This EUA will remain  in effect (meaning this test can be used) for the duration  of the COVID-19 declaration under Section 564(b)(1) of the Act, 21 U.S.C.section 360bbb-3(b)(1), unless the authorization is terminated  or revoked sooner.       Influenza A by PCR NEGATIVE NEGATIVE Final   Influenza B by PCR NEGATIVE NEGATIVE Final    Comment: (NOTE) The Xpert Xpress SARS-CoV-2/FLU/RSV plus assay is intended as an aid in the diagnosis of influenza from Nasopharyngeal swab specimens and should not be used as a sole basis for treatment. Nasal washings and aspirates are unacceptable for Xpert Xpress SARS-CoV-2/FLU/RSV testing.  Fact Sheet for Patients: EntrepreneurPulse.com.au  Fact Sheet for Healthcare Providers: IncredibleEmployment.be  This test is not yet approved or cleared by the [place] FDA and has been authorized for detection and/or diagnosis of SARS-CoV-2 by FDA under an Emergency Use Authorization (EUA). This EUA will remain in effect (meaning this test can be used) for the duration of the COVID-19 declaration under Section 564(b)(1) of the Act, 21 U.S.C. section 360bbb-3(b)(1), unless the authorization is terminated or revoked.  Performed at [facility], [address] [place], [place] [address]     Labs: CBC: Recent Labs  Lab 03/26/22 0118 03/26/22 0546  WBC 8.9 9.3  NEUTROABS 5.9  --   HGB 11.7* 11.7*  HCT 36.0* 36.7*  MCV 88.9 87.8  PLT 291 530   Basic Metabolic Panel: Recent Labs  Lab 03/26/22 0118 03/26/22 0546  NA 138 138  K 3.3* 3.9  CL 110 110  CO2 19* 19*  GLUCOSE 185* 166*  BUN 16 16  CREATININE 1.48* 1.36*  CALCIUM 8.9 9.0  MG  --  2.2   Liver Function Tests: Recent Labs  Lab 03/26/22 0118  AST 16  ALT 19  ALKPHOS 56  BILITOT 0.4  PROT 7.4  ALBUMIN 3.6   CBG: Recent Labs  Lab 03/26/22 1141 03/26/22 1725 03/26/22 2028 03/27/22 0727 03/27/22 1115  GLUCAP 156* 178* 156* 152* 176*    Discharge time spent: greater than 30  minutes.  Signed: Val Riles, MD [facility] 03/27/2022

## 2022-03-27 NOTE — Progress Notes (Signed)
[facility] Cardiology [facility] Encounter Note  Patient: Justin Keith / Admit Date: 03/26/2022 / Date of Encounter: 03/27/2022, 9:03 AM   Subjective: Patient overall feels well today with no evidence of chest pain since arrival to the emergency room.  He had a very short run of shortness of breath and chest pressure.  There were no EKG changes and or elevation of troponin to any significance.  Currently there is no evidence of congestive heart failure or further cardiovascular symptoms on appropriate medication management for his previous coronary artery disease.  Review of Systems: Positive for: None Negative for: Vision change, hearing change, syncope, dizziness, nausea, vomiting,diarrhea, bloody stool, stomach pain, cough, congestion, diaphoresis, urinary frequency, urinary pain,skin lesions, skin rashes Others previously listed  Objective: Telemetry: Normal sinus rhythm Physical Exam: Blood pressure (!) 150/94, pulse 63, temperature 98 F (36.7 C), temperature source Oral, resp. rate 18, height 5' 7.99" (1.727 m), weight 122.3 kg, SpO2 99 %. Body mass index is 41.01 kg/m. General: Well developed, well nourished, in no acute distress. Head: Normocephalic, atraumatic, sclera non-icteric, no xanthomas, nares are without discharge. Neck: No apparent masses Lungs: Normal respirations with no wheezes, no rhonchi, no rales , no crackles   Heart: Regular rate and rhythm, normal S1 S2, no murmur, no rub, no gallop, PMI is normal size and placement, carotid upstroke normal without bruit, jugular venous pressure normal Abdomen: Soft, non-tender, non-distended with normoactive bowel sounds. No hepatosplenomegaly. Abdominal aorta is normal size without bruit Extremities: No edema, no clubbing, no cyanosis, no ulcers,  Peripheral: 2+ radial, 2+ femoral, 2+ dorsal pedal pulses Neuro: Alert and oriented. Moves all extremities spontaneously. Psych:  Responds to questions appropriately with a  normal affect.   Intake/Output Summary (Last 24 hours) at 03/27/2022 0903 Last data filed at 03/27/2022 0600 Gross per 24 hour  Intake 2157.64 ml  Output 1600 ml  Net 557.64 ml    Inpatient Medications:   aspirin EC  81 mg Oral Daily   atorvastatin  80 mg Oral Daily   clopidogrel  75 mg Oral Daily   dapagliflozin propanediol  10 mg Oral Daily   insulin aspart  0-15 Units Subcutaneous TID WC   insulin aspart  0-5 Units Subcutaneous QHS   insulin glargine-yfgn  20 Units Subcutaneous QHS   isosorbide mononitrate  60 mg Oral Daily   lisinopril  10 mg Oral Daily   metoprolol tartrate  25 mg Oral BID   pantoprazole  40 mg Oral Daily   Infusions:   0.9 % NaCl with KCl 20 mEq / L 100 mL/hr at 03/27/22 0600    Labs: Recent Labs    03/26/22 0118 03/26/22 0546  NA 138 138  K 3.3* 3.9  CL 110 110  CO2 19* 19*  GLUCOSE 185* 166*  BUN 16 16  CREATININE 1.48* 1.36*  CALCIUM 8.9 9.0  MG  --  2.2   Recent Labs    03/26/22 0118  AST 16  ALT 19  ALKPHOS 56  BILITOT 0.4  PROT 7.4  ALBUMIN 3.6   Recent Labs    03/26/22 0118 03/26/22 0546  WBC 8.9 9.3  NEUTROABS 5.9  --   HGB 11.7* 11.7*  HCT 36.0* 36.7*  MCV 88.9 87.8  PLT 291 304   No results for input(s): "CKTOTAL", "CKMB", "TROPONINI" in the last 72 hours. Invalid input(s): "POCBNP" No results for input(s): "HGBA1C" in the last 72 hours.   Weights: Filed Weights   03/26/22 0110 03/26/22 1332  Weight:  122.7 kg 122.3 kg     Radiology/Studies:  DG Chest Port 1 View  Result Date: 03/26/2022 CLINICAL DATA:  Chest pain EXAM: PORTABLE CHEST 1 VIEW COMPARISON:  10/26/2021 FINDINGS: Cardiac shadow is enlarged but stable. Postsurgical changes are noted. Lungs are clear bilaterally. No acute bony abnormality is noted. IMPRESSION: No active disease. Electronically Signed   By: Inez Catalina M.D.   On: 03/26/2022 01:42     Assessment and Recommendation  76 y.o. male with known coronary artery disease hypertension  hyperlipidemia diabetes for which she had chest discomfort but no evidence of myocardial infarction or congestive heart failure and known coronary artery disease by coronary bypass graft with cardiac catheterization last year showing no evidence of significant stenoses at that time requiring further intervention.  The patient has done well with appropriate medication management and still is doing well today 1.  Continuation of medication management including dual antiplatelet therapy high intensity cholesterol therapy and hypertension control. 2.  Continue antianginal medication management with isosorbide metoprolol as before 3.  No further cardiac diagnostics necessary at this time 4.  Begin ambulation and follow-up for improvements of symptoms and if no evidence of significant further symptoms from the cardiovascular standpoint okay for discharged home with follow-up in 1 to 2 weeks  Signed, Serafina Royals M.D. FACC

## 2022-08-25 ENCOUNTER — Emergency Department: Payer: 59

## 2022-08-25 ENCOUNTER — Other Ambulatory Visit: Payer: Self-pay

## 2022-08-25 ENCOUNTER — Emergency Department
Admission: EM | Admit: 2022-08-25 | Discharge: 2022-08-25 | Disposition: A | Discharge status: Home / Self Care | Payer: 59 | Attending: Emergency Medicine | Admitting: Emergency Medicine

## 2022-08-25 DIAGNOSIS — R079 Chest pain, unspecified: Secondary | ICD-10-CM

## 2022-08-25 DIAGNOSIS — R0789 Other chest pain: Secondary | ICD-10-CM | POA: Insufficient documentation

## 2022-08-25 DIAGNOSIS — I2510 Atherosclerotic heart disease of native coronary artery without angina pectoris: Secondary | ICD-10-CM | POA: Diagnosis not present

## 2022-08-25 DIAGNOSIS — E1165 Type 2 diabetes mellitus with hyperglycemia: Secondary | ICD-10-CM | POA: Diagnosis not present

## 2022-08-25 DIAGNOSIS — R739 Hyperglycemia, unspecified: Secondary | ICD-10-CM

## 2022-08-25 DIAGNOSIS — N1832 Chronic kidney disease, stage 3b: Secondary | ICD-10-CM | POA: Diagnosis not present

## 2022-08-25 DIAGNOSIS — E1122 Type 2 diabetes mellitus with diabetic chronic kidney disease: Secondary | ICD-10-CM | POA: Diagnosis not present

## 2022-08-25 DIAGNOSIS — I129 Hypertensive chronic kidney disease with stage 1 through stage 4 chronic kidney disease, or unspecified chronic kidney disease: Secondary | ICD-10-CM | POA: Diagnosis not present

## 2022-08-25 LAB — CBC
HCT: 40.3 % (ref 39.0–52.0)
Hemoglobin: 13 g/dL (ref 13.0–17.0)
MCH: 28.7 pg (ref 26.0–34.0)
MCHC: 32.3 g/dL (ref 30.0–36.0)
MCV: 89 fL (ref 80.0–100.0)
Platelets: 309 10*3/uL (ref 150–400)
RBC: 4.53 MIL/uL (ref 4.22–5.81)
RDW: 13.9 % (ref 11.5–15.5)
WBC: 8.9 10*3/uL (ref 4.0–10.5)
nRBC: 0 % (ref 0.0–0.2)

## 2022-08-25 LAB — HEPATIC FUNCTION PANEL
ALT: 14 U/L (ref 0–44)
AST: 18 U/L (ref 15–41)
Albumin: 3.7 g/dL (ref 3.5–5.0)
Alkaline Phosphatase: 60 U/L (ref 38–126)
Bilirubin, Direct: <0.1 mg/dL (ref 0.0–0.2)
Total Bilirubin: 0.5 mg/dL (ref 0.3–1.2)
Total Protein: 7.8 g/dL (ref 6.5–8.1)

## 2022-08-25 LAB — BASIC METABOLIC PANEL
Anion gap: 9 (ref 5–15)
BUN: 25 mg/dL — ABNORMAL HIGH (ref 8–23)
CO2: 19 mmol/L — ABNORMAL LOW (ref 22–32)
Calcium: 9.2 mg/dL (ref 8.9–10.3)
Chloride: 106 mmol/L (ref 98–111)
Creatinine, Ser: 1.7 mg/dL — ABNORMAL HIGH (ref 0.61–1.24)
GFR, Estimated: 41 mL/min — ABNORMAL LOW (ref >60)
Glucose, Bld: 221 mg/dL — ABNORMAL HIGH (ref 70–99)
Potassium: 3.8 mmol/L (ref 3.5–5.1)
Sodium: 134 mmol/L — ABNORMAL LOW (ref 135–145)

## 2022-08-25 LAB — TROPONIN I (HIGH SENSITIVITY)
Troponin I (High Sensitivity): 15 ng/L (ref <18)
Troponin I (High Sensitivity): 16 ng/L (ref <18)

## 2022-08-25 LAB — LIPASE, BLOOD: Lipase: 32 U/L (ref 11–51)

## 2022-08-25 MED ORDER — ALUM & MAG HYDROXIDE-SIMETH 200-200-20 MG/5ML PO SUSP
30.0000 mL | Freq: Once | ORAL | Status: AC
  Administered 2022-08-25: 30 mL via ORAL
  Filled 2022-08-25: qty 30

## 2022-08-25 NOTE — Discharge Instructions (Addendum)
Please follow-up with [facility] regarding your blood sugar and your elevated lipids.  Please continue to take your medications as prescribed and watch your carbohydrate intake.  EKG and cardiac enzymes are reassuring today.  If you develop new or concerning chest pain to you please return to the emergency department.  Otherwise please follow-up with your cardiologist in the next several days.

## 2022-08-25 NOTE — ED Triage Notes (Signed)
First Nurse: Pt here via [facility] with abnormal labs, unknown lab, pt was told to come to the ED.   142/80 58 97% RA 240-cbg

## 2022-08-25 NOTE — ED Provider Notes (Signed)
[facility] Provider Note    Event Date/Time   First MD Initiated Contact with Patient 08/25/22 1537     (approximate)   History   abnormal labs   HPI  Justin Keith is a 77 y.o. male past medical history of CKD, CAD, diabetes, GERD, hyperlipidemia who presents because of abnormal labs.  Patient says he has blood work done on Friday 3 days ago was told it was abnormal.  Tells me that his blood sugar was elevated on labs and his doctor told him they can tell is not taking his cholesterol medication.  Patient tells he is otherwise feeling fine.  However without prompting he also says that he is having chest pain that started today.  Describes it as pressure-like and it radiates into the abdomen feels like something is squeezing that he has swelling in the chest and abdomen.  Is been coming and going well.  Saturating alleviating factors is nonexertional no associated nausea or diaphoresis.  Is not vomiting.  He has no history of similar.  When asked if you come to the hospital for this independent of the labs he says he is not sure.  He did not tell triage nurse this and when I asked him why he says he forgot.     Past Medical History:  Diagnosis Date   CAD (coronary artery disease)    CKD (chronic kidney disease)    Diabetes mellitus without complication ([place])    GERD (gastroesophageal reflux disease)    Hyperlipemia    Hypertension     Patient Active Problem List   Diagnosis Date Noted   Dyslipidemia 03/26/2022   Type 2 diabetes mellitus without complication, with long-term current use of insulin ([place]) 03/26/2022   Coronary artery disease 03/26/2022   Hypokalemia 03/26/2022   Elevated troponin    ACS (acute coronary syndrome) ([place]) 10/26/2021   Obesity, Class III, BMI 40-49.9 (morbid obesity) (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   Elevated troponin level    Stage 3a chronic kidney disease (HCC)    Hyponatremia    Cardiomyopathy ([place])    Acute non-ST elevation myocardial infarction (NSTEMI) ([place]) 11/26/2019   NSTEMI (non-ST elevated myocardial infarction) ([place]) 11/25/2019   AKI (acute kidney injury) ([place]) 11/25/2019   Angina pectoris ([place]) 03/15/2019   Problems with swallowing and mastication    Chest pain in adult 10/22/2018   Essential hypertension 09/15/2018   HLD (hyperlipidemia) 09/15/2018   GERD (gastroesophageal reflux disease) 09/15/2018   CAD (coronary artery disease) 09/15/2018   Uncontrolled secondary diabetes mellitus with stage 3 CKD (GFR 30-59) 09/15/2018   Chest pain 12/07/2015     Physical Exam  Triage Vital Signs: ED Triage Vitals [08/25/22 1455]  Enc Vitals Group     BP (!) 126/90     Pulse Rate 61     Resp 20     Temp 98.2 F (36.8 C)     Temp Source Oral     SpO2 95 %     Weight 244 lb (110.7 kg)     Height 5\' 8"  (1.727 m)     Head Circumference      Peak Flow      Pain Score 0     Pain Loc      Pain Edu?  Excl. in [place]?     Most recent vital signs: Vitals:   08/25/22 1455  BP: (!) 126/90  Pulse: 61  Resp: 20  Temp: 98.2 F (36.8 C)  SpO2: 95%     General: Awake, no distress.  CV:  Good peripheral perfusion.  Resp:  Normal effort.  Lungs are clear Abd:  No distention.  Abdomen is soft and nontender throughout Neuro:             Awake, Alert, Oriented x 3  Other:     ED Results / Procedures / Treatments  Labs (all labs ordered are listed, but only abnormal results are displayed) Labs Reviewed  BASIC METABOLIC PANEL - Abnormal; Notable for the following components:      Result Value   Sodium 134 (*)    CO2 19 (*)    Glucose, Bld 221 (*)    BUN 25 (*)    Creatinine, Ser 1.70 (*)    GFR, Estimated 41 (*)    All other components within normal limits  CBC  LIPASE, BLOOD  HEPATIC FUNCTION PANEL  TROPONIN I (HIGH SENSITIVITY)   TROPONIN I (HIGH SENSITIVITY)     EKG  EKG reviewed inter myself shows sinus bradycardia with first-degree AV block T wave versions 2 aVF V3 through V6 similar to prior   RADIOLOGY I reviewed and interpreted the CXR which does not show any acute cardiopulmonary process    PROCEDURES:  Critical Care performed: No  Procedures  The patient is on the cardiac monitor to evaluate for evidence of arrhythmia and/or significant heart rate changes.   MEDICATIONS ORDERED IN ED: Medications  alum & mag hydroxide-simeth (MAALOX/MYLANTA) 200-200-20 MG/5ML suspension 30 mL (30 mLs Oral Given 08/25/22 1618)     IMPRESSION / MDM / ASSESSMENT AND PLAN / ED COURSE  I reviewed the triage vital signs and the nursing notes.                              Patient's presentation is most consistent with acute presentation with potential threat to life or bodily function.  Differential diagnosis includes, but is not limited to, ACS, GERD, low suspicion for PE, aortic dissection  Patient is a 77 year old male who presents because of abnormal labs.  He had labs drawn at [facility] 3 days ago and he tells me that he was told his blood sugar was elevated and that his lipids were elevated.  Patient initially tells me he is feeling fine and had no other complaints in triage.  Without prompting he later tells me he is having pain in the chest that started around 11 today described as pressure with some associated bloating and pain in the abdomen as well.  Says he is never had this pain before.  His abdominal exam is benign and nontender and I have low suspicion for acute abdominal process.  Patient is resting comfortably and has no tachycardia or hypoxia low suspicion for pulmonary embolism.  He is high risk for coronary artery disease EKG troponin.  I do find it somewhat unusual that patient did not remember to tell triage and if he was having significant chest pain he was concerned about I do think that is  likely he would have told the providers upfront about this.  Regardless he is high risk so we wil check serial enzymes.  Will give a GI cocktail does describe some burning sensation in his abdomen which could  be GERD.  He does have T wave inversions in multiple leads including 2 aVF V3 through V6 although this is similar to prior EKG.  First troponin is negative.  Will repeat.  Renal function is around baseline with GFR 41 blood sugar is 220 without evidence of DKA.  Patient tells me his blood sugars been ranging from 1 90-2 40 at home so this is not really that far off his baseline.  Function panel and lipase are normal.  On repeat assessment patient says he is pain-free.  Repeat troponin is negative.  With reassuring labs and negative troponin and no EKG changes with low suspicion for ACS clinical I think that he can safely be discharged.  Did discuss return precautions including worsening abdominal pain or any new chest pain.  Recommend he follow-up with [facility] regarding his elevated blood sugar and elevated lipids.        FINAL CLINICAL IMPRESSION(S) / ED DIAGNOSES   Final diagnoses:  Chest pain, unspecified type  Hyperglycemia     Rx / DC Orders   ED Discharge Orders     None        Note:  This document was prepared using Dragon voice recognition software and may include unintentional dictation errors.   Rada Hay, MD 08/25/22 [phone]

## 2022-08-25 NOTE — ED Triage Notes (Signed)
Pt to ED for abnormal labs taken on Friday, states he thinks it might have been his sugar. States CBG was 240 at lunch

## 2022-11-13 ENCOUNTER — Other Ambulatory Visit: Payer: 59

## 2022-11-13 ENCOUNTER — Emergency Department: Payer: 59

## 2022-11-13 ENCOUNTER — Other Ambulatory Visit: Payer: Self-pay

## 2022-11-13 ENCOUNTER — Inpatient Hospital Stay
Admission: EM | Admit: 2022-11-13 | Discharge: 2022-11-15 | DRG: 281 | Disposition: A | Discharge status: Home / Self Care | Payer: 59 | Attending: Internal Medicine | Admitting: Internal Medicine

## 2022-11-13 DIAGNOSIS — Z794 Long term (current) use of insulin: Secondary | ICD-10-CM

## 2022-11-13 DIAGNOSIS — N1832 Chronic kidney disease, stage 3b: Secondary | ICD-10-CM | POA: Diagnosis present

## 2022-11-13 DIAGNOSIS — F17220 Nicotine dependence, chewing tobacco, uncomplicated: Secondary | ICD-10-CM | POA: Diagnosis present

## 2022-11-13 DIAGNOSIS — R079 Chest pain, unspecified: Secondary | ICD-10-CM | POA: Diagnosis not present

## 2022-11-13 DIAGNOSIS — Z833 Family history of diabetes mellitus: Secondary | ICD-10-CM

## 2022-11-13 DIAGNOSIS — E1122 Type 2 diabetes mellitus with diabetic chronic kidney disease: Secondary | ICD-10-CM | POA: Diagnosis present

## 2022-11-13 DIAGNOSIS — I214 Non-ST elevation (NSTEMI) myocardial infarction: Secondary | ICD-10-CM | POA: Diagnosis present

## 2022-11-13 DIAGNOSIS — E1165 Type 2 diabetes mellitus with hyperglycemia: Secondary | ICD-10-CM | POA: Diagnosis present

## 2022-11-13 DIAGNOSIS — Z7902 Long term (current) use of antithrombotics/antiplatelets: Secondary | ICD-10-CM | POA: Diagnosis not present

## 2022-11-13 DIAGNOSIS — E1129 Type 2 diabetes mellitus with other diabetic kidney complication: Secondary | ICD-10-CM | POA: Diagnosis present

## 2022-11-13 DIAGNOSIS — Z951 Presence of aortocoronary bypass graft: Secondary | ICD-10-CM | POA: Diagnosis not present

## 2022-11-13 DIAGNOSIS — Z8249 Family history of ischemic heart disease and other diseases of the circulatory system: Secondary | ICD-10-CM

## 2022-11-13 DIAGNOSIS — I10 Essential (primary) hypertension: Secondary | ICD-10-CM

## 2022-11-13 DIAGNOSIS — K219 Gastro-esophageal reflux disease without esophagitis: Secondary | ICD-10-CM | POA: Diagnosis present

## 2022-11-13 DIAGNOSIS — Z7982 Long term (current) use of aspirin: Secondary | ICD-10-CM | POA: Diagnosis not present

## 2022-11-13 DIAGNOSIS — R7989 Other specified abnormal findings of blood chemistry: Secondary | ICD-10-CM

## 2022-11-13 DIAGNOSIS — I2510 Atherosclerotic heart disease of native coronary artery without angina pectoris: Secondary | ICD-10-CM | POA: Diagnosis present

## 2022-11-13 DIAGNOSIS — E785 Hyperlipidemia, unspecified: Secondary | ICD-10-CM | POA: Diagnosis present

## 2022-11-13 DIAGNOSIS — I5032 Chronic diastolic (congestive) heart failure: Secondary | ICD-10-CM | POA: Diagnosis present

## 2022-11-13 DIAGNOSIS — I130 Hypertensive heart and chronic kidney disease with heart failure and stage 1 through stage 4 chronic kidney disease, or unspecified chronic kidney disease: Secondary | ICD-10-CM | POA: Diagnosis present

## 2022-11-13 DIAGNOSIS — Z6839 Body mass index (BMI) 39.0-39.9, adult: Secondary | ICD-10-CM | POA: Diagnosis not present

## 2022-11-13 DIAGNOSIS — Z79899 Other long term (current) drug therapy: Secondary | ICD-10-CM

## 2022-11-13 LAB — BASIC METABOLIC PANEL
Anion gap: 10 (ref 5–15)
BUN: 25 mg/dL — ABNORMAL HIGH (ref 8–23)
CO2: 19 mmol/L — ABNORMAL LOW (ref 22–32)
Calcium: 9 mg/dL (ref 8.9–10.3)
Chloride: 106 mmol/L (ref 98–111)
Creatinine, Ser: 1.78 mg/dL — ABNORMAL HIGH (ref 0.61–1.24)
GFR, Estimated: 39 mL/min — ABNORMAL LOW (ref >60)
Glucose, Bld: 343 mg/dL — ABNORMAL HIGH (ref 70–99)
Potassium: 4.1 mmol/L (ref 3.5–5.1)
Sodium: 135 mmol/L (ref 135–145)

## 2022-11-13 LAB — PROTIME-INR
INR: 1 (ref 0.8–1.2)
Prothrombin Time: 13 seconds (ref 11.4–15.2)

## 2022-11-13 LAB — CBG MONITORING, ED
Glucose-Capillary: 208 mg/dL — ABNORMAL HIGH (ref 70–99)
Glucose-Capillary: 173 mg/dL — ABNORMAL HIGH (ref 70–99)
Glucose-Capillary: 162 mg/dL — ABNORMAL HIGH (ref 70–99)

## 2022-11-13 LAB — URINE DRUG SCREEN, QUALITATIVE (ARMC ONLY)
Amphetamines, Ur Screen: NOT DETECTED
Barbiturates, Ur Screen: NOT DETECTED
Benzodiazepine, Ur Scrn: NOT DETECTED
Cannabinoid 50 Ng, Ur ~~LOC~~: NOT DETECTED
Cocaine Metabolite,Ur ~~LOC~~: NOT DETECTED
MDMA (Ecstasy)Ur Screen: NOT DETECTED
Methadone Scn, Ur: NOT DETECTED
Opiate, Ur Screen: NOT DETECTED
Phencyclidine (PCP) Ur S: NOT DETECTED
Tricyclic, Ur Screen: NOT DETECTED

## 2022-11-13 LAB — TROPONIN I (HIGH SENSITIVITY)
Troponin I (High Sensitivity): 70 ng/L — ABNORMAL HIGH (ref <18)
Troponin I (High Sensitivity): 305 ng/L (ref <18)
Troponin I (High Sensitivity): 467 ng/L (ref <18)
Troponin I (High Sensitivity): 448 ng/L (ref <18)
Troponin I (High Sensitivity): 725 ng/L (ref <18)
Troponin I (High Sensitivity): 697 ng/L (ref <18)

## 2022-11-13 LAB — CBC
HCT: 37.4 % — ABNORMAL LOW (ref 39.0–52.0)
Hemoglobin: 12.1 g/dL — ABNORMAL LOW (ref 13.0–17.0)
MCH: 28.9 pg (ref 26.0–34.0)
MCHC: 32.4 g/dL (ref 30.0–36.0)
MCV: 89.3 fL (ref 80.0–100.0)
Platelets: 280 10*3/uL (ref 150–400)
RBC: 4.19 MIL/uL — ABNORMAL LOW (ref 4.22–5.81)
RDW: 14.6 % (ref 11.5–15.5)
WBC: 9.4 10*3/uL (ref 4.0–10.5)
nRBC: 0 % (ref 0.0–0.2)

## 2022-11-13 LAB — BRAIN NATRIURETIC PEPTIDE: B Natriuretic Peptide: 91.2 pg/mL (ref 0.0–100.0)

## 2022-11-13 LAB — APTT: aPTT: 32 seconds (ref 24–36)

## 2022-11-13 LAB — HEPARIN LEVEL (UNFRACTIONATED): Heparin Unfractionated: 0.32 IU/mL (ref 0.30–0.70)

## 2022-11-13 MED ORDER — ASPIRIN 81 MG PO TBEC
81.0000 mg | DELAYED_RELEASE_TABLET | Freq: Every day | ORAL | Status: DC
  Administered 2022-11-14 – 2022-11-15 (×2): 81 mg via ORAL
  Filled 2022-11-13 (×2): qty 1

## 2022-11-13 MED ORDER — SODIUM CHLORIDE 0.9 % IV SOLN: INTRAVENOUS | Status: AC

## 2022-11-13 MED ORDER — ATORVASTATIN CALCIUM 80 MG PO TABS
80.0000 mg | ORAL_TABLET | Freq: Every day | ORAL | Status: DC
  Administered 2022-11-13 – 2022-11-14 (×2): 80 mg via ORAL
  Filled 2022-11-13 (×2): qty 1

## 2022-11-13 MED ORDER — PANTOPRAZOLE SODIUM 40 MG PO TBEC
40.0000 mg | DELAYED_RELEASE_TABLET | Freq: Every day | ORAL | Status: DC
  Administered 2022-11-13 – 2022-11-15 (×3): 40 mg via ORAL
  Filled 2022-11-13 (×3): qty 1

## 2022-11-13 MED ORDER — NITROGLYCERIN 0.4 MG SL SUBL: 0.4000 mg | SUBLINGUAL_TABLET | SUBLINGUAL | Status: DC | PRN

## 2022-11-13 MED ORDER — SODIUM CHLORIDE 0.9 % IV SOLN: INTRAVENOUS | Status: DC

## 2022-11-13 MED ORDER — CLOPIDOGREL BISULFATE 75 MG PO TABS
75.0000 mg | ORAL_TABLET | Freq: Every day | ORAL | Status: DC
  Administered 2022-11-13 – 2022-11-15 (×3): 75 mg via ORAL
  Filled 2022-11-13 (×3): qty 1

## 2022-11-13 MED ORDER — HEPARIN BOLUS VIA INFUSION
4000.0000 [IU] | Freq: Once | INTRAVENOUS | Status: AC
  Administered 2022-11-13: 4000 [IU] via INTRAVENOUS
  Filled 2022-11-13: qty 4000

## 2022-11-13 MED ORDER — ACETAMINOPHEN 325 MG PO TABS: 650.0000 mg | ORAL_TABLET | Freq: Four times a day (QID) | ORAL | Status: DC | PRN

## 2022-11-13 MED ORDER — ISOSORBIDE MONONITRATE ER 60 MG PO TB24
60.0000 mg | ORAL_TABLET | Freq: Every day | ORAL | Status: DC
  Administered 2022-11-13: 60 mg via ORAL
  Filled 2022-11-13: qty 1

## 2022-11-13 MED ORDER — HYDRALAZINE HCL 20 MG/ML IJ SOLN: 5.0000 mg | INTRAMUSCULAR | Status: DC | PRN

## 2022-11-13 MED ORDER — LISINOPRIL 10 MG PO TABS
10.0000 mg | ORAL_TABLET | Freq: Every day | ORAL | Status: DC
  Filled 2022-11-13: qty 1

## 2022-11-13 MED ORDER — ONDANSETRON HCL 4 MG/2ML IJ SOLN: 4.0000 mg | Freq: Three times a day (TID) | INTRAMUSCULAR | Status: DC | PRN

## 2022-11-13 MED ORDER — METOPROLOL TARTRATE 25 MG PO TABS
25.0000 mg | ORAL_TABLET | Freq: Two times a day (BID) | ORAL | Status: DC
  Administered 2022-11-13 – 2022-11-15 (×3): 25 mg via ORAL
  Filled 2022-11-13 (×5): qty 1

## 2022-11-13 MED ORDER — INSULIN GLARGINE-YFGN 100 UNIT/ML ~~LOC~~ SOLN
20.0000 [IU] | Freq: Every day | SUBCUTANEOUS | Status: DC
  Administered 2022-11-13 – 2022-11-14 (×2): 20 [IU] via SUBCUTANEOUS
  Filled 2022-11-13 (×3): qty 0.2

## 2022-11-13 MED ORDER — MORPHINE SULFATE (PF) 2 MG/ML IV SOLN: 2.0000 mg | INTRAVENOUS | Status: DC | PRN

## 2022-11-13 MED ORDER — INSULIN ASPART 100 UNIT/ML IJ SOLN
0.0000 [IU] | Freq: Three times a day (TID) | INTRAMUSCULAR | Status: DC
  Administered 2022-11-13: 7 [IU] via SUBCUTANEOUS
  Administered 2022-11-13: 3 [IU] via SUBCUTANEOUS
  Administered 2022-11-13 – 2022-11-15 (×6): 2 [IU] via SUBCUTANEOUS
  Filled 2022-11-13 (×8): qty 1

## 2022-11-13 MED ORDER — HEPARIN (PORCINE) 25000 UT/250ML-% IV SOLN
1250.0000 [IU]/h | INTRAVENOUS | Status: DC
  Administered 2022-11-13 – 2022-11-14 (×3): 1250 [IU]/h via INTRAVENOUS
  Filled 2022-11-13 (×3): qty 250

## 2022-11-13 MED ORDER — INSULIN ASPART 100 UNIT/ML IJ SOLN
0.0000 [IU] | Freq: Every day | INTRAMUSCULAR | Status: DC
  Administered 2022-11-14: 2 [IU] via SUBCUTANEOUS
  Filled 2022-11-13: qty 1

## 2022-11-13 MED ORDER — INSULIN ASPART 100 UNIT/ML IJ SOLN: 0.0000 [IU] | Freq: Three times a day (TID) | INTRAMUSCULAR | Status: DC

## 2022-11-13 MED ORDER — ISOSORBIDE MONONITRATE ER 60 MG PO TB24
90.0000 mg | ORAL_TABLET | Freq: Every day | ORAL | Status: DC
  Administered 2022-11-14 – 2022-11-15 (×2): 90 mg via ORAL
  Filled 2022-11-13: qty 1
  Filled 2022-11-13: qty 2

## 2022-11-13 MED ORDER — HEPARIN SODIUM (PORCINE) 5000 UNIT/ML IJ SOLN: 5000.0000 [IU] | Freq: Three times a day (TID) | INTRAMUSCULAR | Status: DC

## 2022-11-13 NOTE — ED Triage Notes (Signed)
Patient arrives with [facility] from home c/o chest pain in the center of his chest. Chest pain started while patient was watching television at 4am this morning. Patient has hx of MI in 2005. Patient's BP was 170/80; given 2 sprays of nitro and 324 aspirin. Patient a&o x4; in no acute distress during triage.  EMS vitals  130/81 after nitro HR 88 CBG 313

## 2022-11-13 NOTE — Consult Note (Signed)
ANTICOAGULATION CONSULT NOTE - Initial Consult  Pharmacy Consult for heparin infusion Indication: chest pain/ACS  No Known Allergies  Patient Measurements: Height: 5\' 8"  (172.7 cm) Weight: 117.9 kg (260 lb) IBW/kg (Calculated) : 68.4 Heparin Dosing Weight: 95.2 kg  Vital Signs: Temp: 99.3 F (37.4 C) (06/13 0700) Temp Source: Oral (06/13 0700) BP: 124/63 (06/13 1100) Pulse Rate: 56 (06/13 1100)  Labs: Recent Labs    11/13/22 0708 11/13/22 0950  HGB 12.1*  --   HCT 37.4*  --   PLT 280  --   APTT 32  --   LABPROT 13.0  --   INR 1.0  --   CREATININE 1.78*  --   TROPONINIHS 70* 305*    Estimated Creatinine Clearance: 44 mL/min (A) (by C-G formula based on SCr of 1.78 mg/dL (H)).   Medical History: Past Medical History:  Diagnosis Date   CAD (coronary artery disease)    CKD (chronic kidney disease)    Diabetes mellitus without complication (HCC)    GERD (gastroesophageal reflux disease)    Hyperlipemia    Hypertension     Medications:  No prior anticoagulation noted   Assessment: 77 y.o. male past medical history significant for CAD with prior CABG, obesity, hypertension, hyperlipidemia, HFrEF, presented to ED with chest pain that started while watching TV. Pain radiates to neck and lower abdomen. Troponin I initial 70 > 305 > 467. Pharmacy has been consulted to initiate and manage IV heparin therapy.    Goal of Therapy:  Heparin level 0.3-0.7 units/ml Monitor platelets by anticoagulation protocol: Yes   Plan:  Give 4000 units bolus x 1 Start heparin infusion at 1250 units/hr Check anti-Xa level in 8 hours and daily while on heparin Continue to monitor H&H and platelets  Sharen Hones, PharmD, BCPS Clinical Pharmacist   11/13/2022,11:22 AM

## 2022-11-13 NOTE — ED Notes (Signed)
RN notified provider of pt continued elevating troponin.

## 2022-11-13 NOTE — Consult Note (Signed)
ANTICOAGULATION CONSULT NOTE  Pharmacy Consult for Heparin Infusion Indication: chest pain/ACS  Patient Measurements: Height: 5\' 8"  (172.7 cm) Weight: 117.9 kg (260 lb) IBW/kg (Calculated) : 68.4 Heparin Dosing Weight: 95.2 kg  Labs: Recent Labs    11/13/22 0708 11/13/22 0950 11/13/22 1150 11/13/22 1618 11/13/22 1934  HGB 12.1*  --   --   --   --   HCT 37.4*  --   --   --   --   PLT 280  --   --   --   --   APTT 32  --   --   --   --   LABPROT 13.0  --   --   --   --   INR 1.0  --   --   --   --   HEPARINUNFRC  --   --   --   --  0.32  CREATININE 1.78*  --   --   --   --   TROPONINIHS 70*   < > 467* 448* 725*   < > = values in this interval not displayed.    Estimated Creatinine Clearance: 44 mL/min (A) (by C-G formula based on SCr of 1.78 mg/dL (H)).  Medical History: Past Medical History:  Diagnosis Date   CAD (coronary artery disease)    CKD (chronic kidney disease)    Diabetes mellitus without complication (HCC)    GERD (gastroesophageal reflux disease)    Hyperlipemia    Hypertension     Medications:  No prior anticoagulation noted   Assessment: 77 y.o. male past medical history significant for CAD with prior CABG, obesity, hypertension, hyperlipidemia, HFrEF, presented to ED with chest pain that started while watching TV. Pain radiates to neck and lower abdomen. Troponin I initial 70 > 305 > 467. Pharmacy has been consulted to initiate and manage IV heparin therapy.    1610 1934 HL 0.32, therapeutic x 1; 1250 un/hr  Goal of Therapy:  Heparin level 0.3-0.7 units/ml Monitor platelets by anticoagulation protocol: Yes   Plan:  --Heparin level is therapeutic x 1 --Continue heparin infusion at 1250 units/hr --Re-check HL in 8 hours --Daily CBC per protocol while on IV heparin  Tressie Ellis 11/13/2022,8:37 PM

## 2022-11-13 NOTE — ED Provider Notes (Signed)
[facility] Provider Note    Event Date/Time   First MD Initiated Contact with Patient 11/13/22 [phone]     (approximate)   History   Chest Pain   HPI  Justin Keith is a 77 y.o. male past medical history significant for CAD with prior CABG, obesity, hypertension, hyperlipidemia, HFrEF, presents to the emergency department with chest pain.  Chest pain started at 4:00 in the morning when he was watching TV.  States that the pain radiated from his left side of his chest up to his neck and lower abdomen.  He took aspirin 325 mg and nitroglycerin with some improvement of his pain.  States that the pain then came back so he called 911.  Patient received nitroglycerin with EMS.  Currently states that he is chest pain-free.  Denies any abdominal pain.  Denies any nausea, vomiting, diaphoresis or shortness of breath.  No change with urination.  Denies any swelling to his legs.  Followed by Dr. Juliann Pares with cardiology.  Last nuclear medicine stress test was in May 2024 -without inducible ischemia.  Last cardiac catheterization was in 2019.     Physical Exam   Triage Vital Signs: ED Triage Vitals  Enc Vitals Group     BP 11/13/22 0700 (!) 141/74     Pulse Rate 11/13/22 0700 76     Resp 11/13/22 0700 (!) 9     Temp 11/13/22 0700 99.3 F (37.4 C)     Temp Source 11/13/22 0700 Oral     SpO2 11/13/22 0700 97 %     Weight 11/13/22 0705 260 lb (117.9 kg)     Height 11/13/22 0705 5\' 8"  (1.727 m)     Head Circumference --      Peak Flow --      Pain Score 11/13/22 0704 7     Pain Loc --      Pain Edu? --      Excl. in [place]? --     Most recent vital signs: Vitals:   11/13/22 0700 11/13/22 0800  BP: (!) 141/74   Pulse: 76 (!) 57  Resp: (!) 9 20  Temp: 99.3 F (37.4 C)   SpO2: 97% 95%    Physical Exam Constitutional:      Appearance: He is well-developed. He is obese.  HENT:     Head: Atraumatic.  Eyes:     Conjunctiva/sclera: Conjunctivae normal.   Cardiovascular:     Rate and Rhythm: Regular rhythm.     Heart sounds: Normal heart sounds.  Pulmonary:     Effort: No respiratory distress.     Breath sounds: No decreased breath sounds.  Abdominal:     Palpations: Abdomen is soft.  Musculoskeletal:     Cervical back: Normal range of motion.     Right lower leg: No edema.     Left lower leg: No edema.  Skin:    General: Skin is warm.     Capillary Refill: Capillary refill takes less than 2 seconds.  Neurological:     Mental Status: He is alert and oriented to person, place, and time. Mental status is at baseline.  Psychiatric:        Mood and Affect: Mood normal.     IMPRESSION / MDM / ASSESSMENT AND PLAN / ED COURSE  I reviewed the triage vital signs and the nursing notes.  On chart review patient had a cardiac catheterization in 2019, nuclear med stress test done in May of  this year.  Followed by Dr. Juliann Pares.    Differential diagnosis including ACS, pneumonia, pleurisy, gastritis/PUD  EKG  I, Corena Herter, the attending physician, personally viewed and interpreted this ECG.  EKG showed normal sinus rhythm.  First-degree heart block with a PR interval of 229.  Narrow complex.  ST depression to V5, V6 and in the lateral leads.  No significant ST elevation.  T wave inverted to lead aVF.  No significant change when compared to prior EKG  No tachycardic or bradycardic dysrhythmias while on cardiac telemetry.  RADIOLOGY I independently reviewed imaging, my interpretation of imaging: Chest x-ray with no focal findings consistent with pneumonia. -Read as no acute findings.  LABS (all labs ordered are listed, but only abnormal results are displayed) Labs interpreted as -    Labs Reviewed  BASIC METABOLIC PANEL - Abnormal; Notable for the following components:      Result Value   CO2 19 (*)    Glucose, Bld 343 (*)    BUN 25 (*)    Creatinine, Ser 1.78 (*)    GFR, Estimated 39 (*)    All other components within  normal limits  CBC - Abnormal; Notable for the following components:   RBC 4.19 (*)    Hemoglobin 12.1 (*)    HCT 37.4 (*)    All other components within normal limits  TROPONIN I (HIGH SENSITIVITY) - Abnormal; Notable for the following components:   Troponin I (High Sensitivity) 70 (*)    All other components within normal limits  PROTIME-INR     MDM  Patient received aspirin and nitroglycerin prior to arrival.  Hyperglycemia but does not meet criteria for DKA.  No significant electrolyte abnormalities.  Creatinine is at his baseline.  Troponin elevated at 70.  Currently chest pain-free  Consulted hospitalist for admission for ongoing ACS workup     PROCEDURES:  Critical Care performed: yes  .Critical Care  Performed by: Corena Herter, MD Authorized by: Corena Herter, MD   Critical care provider statement:    Critical care time (minutes):  30   Critical care time was exclusive of:  Separately billable procedures and treating other patients   Critical care was necessary to treat or prevent imminent or life-threatening deterioration of the following conditions:  Cardiac failure   Critical care was time spent personally by me on the following activities:  Development of treatment plan with patient or surrogate, discussions with consultants, evaluation of patient's response to treatment, examination of patient, ordering and review of laboratory studies, ordering and review of radiographic studies, ordering and performing treatments and interventions, pulse oximetry, re-evaluation of patient's condition and review of old charts   Patient's presentation is most consistent with acute presentation with potential threat to life or bodily function.   MEDICATIONS ORDERED IN ED: Medications - No data to display  FINAL CLINICAL IMPRESSION(S) / ED DIAGNOSES   Final diagnoses:  Chest pain, unspecified type  Elevated troponin     Rx / DC Orders   ED Discharge Orders     None         Note:  This document was prepared using Dragon voice recognition software and may include unintentional dictation errors.   Corena Herter, MD 11/13/22 [phone]

## 2022-11-13 NOTE — H&P (Signed)
History and Physical    [name] RUE:[id number] DOB: August 26, 1945 DOA: 11/13/2022  Referring MD/NP/PA:   PCP: [facility]   Patient coming from:  The patient is coming from home.     Chief Complaint: chest pain  HPI: Justin Keith is a 77 y.o. male with medical history significant of CAD, s/p of CBAG, HTN, HLD, dCHF, CKD-3b, obesity, who presents with chest pain.   Pt states that his chest pain started in early morning at about 4 AM, which is located in the substernal area and left side of chest, pressure-like, moderate initially, currently mild, intermittent, radiating to bilateral shoulders.  Not associated with shortness of breath.  No cough, fever or chills.  Patient denies nausea, vomiting, diarrhea or abdominal pain.  No symptoms of UTI.  Patient was given 324 mg of aspirin and nitroglycerin by EMS with improvement of his chest pain   Data reviewed independently and ED Course: pt was found to have trop 70 --> 305 --> 467, WBC 9.4, INR 1.0, liver function close to baseline, temperature 99.3, blood pressure 141/74, heart rate 57, RR 20, oxygen saturation 95% on room air.  Chest x-ray negative.  Patient is admitted to telemetry bed as inpatient. Dr. Darrold Junker of cardiology is consulted.   EKG: I have personally reviewed.  Sinus rhythm, QTc 413, LAD, poor IV progression, mild T wave inversion in inferior leads and V5-V6.   Review of Systems:   General: no fevers, chills, no body weight gain, has fatigue HEENT: no blurry vision, hearing changes or sore throat Respiratory: no dyspnea, coughing, wheezing CV: has chest pain, no palpitations GI: no nausea, vomiting, abdominal pain, diarrhea, constipation GU: no dysuria, burning on urination, increased urinary frequency, hematuria  Ext: no leg edema Neuro: no unilateral weakness, numbness, or tingling, no vision change or hearing loss Skin: no rash, no skin tear. MSK: No muscle spasm, no deformity, no  limitation of range of movement in spin Heme: No easy bruising.  Travel history: No recent long distant travel.   Allergy: No Known Allergies  Past Medical History:  Diagnosis Date   CAD (coronary artery disease)    CKD (chronic kidney disease)    Diabetes mellitus without complication (HCC)    GERD (gastroesophageal reflux disease)    Hyperlipemia    Hypertension     Past Surgical History:  Procedure Laterality Date   CARDIAC CATHETERIZATION     CARDIAC SURGERY     COLONOSCOPY WITH PROPOFOL N/A 02/25/2018   Procedure: COLONOSCOPY WITH PROPOFOL;  Surgeon: Wyline Mood, MD;  Location: [facility] ENDOSCOPY;  Service: Gastroenterology;  Laterality: N/A;   COLONOSCOPY WITH PROPOFOL N/A 02/26/2018   Procedure: COLONOSCOPY WITH PROPOFOL;  Surgeon: Wyline Mood, MD;  Location: [facility] ENDOSCOPY;  Service: Gastroenterology;  Laterality: N/A;   ESOPHAGOGASTRODUODENOSCOPY (EGD) WITH PROPOFOL N/A 01/18/2019   Procedure: ESOPHAGOGASTRODUODENOSCOPY (EGD) WITH PROPOFOL;  Surgeon: Midge Minium, MD;  Location: [facility] ENDOSCOPY;  Service: Endoscopy;  Laterality: N/A;   FOREIGN BODY REMOVAL N/A 03/07/2018   Procedure: FOREIGN BODY REMOVAL;  Surgeon: Wyline Mood, MD;  Location: [facility] ENDOSCOPY;  Service: Gastroenterology;  Laterality: N/A;   LEFT HEART CATH AND CORS/GRAFTS ANGIOGRAPHY N/A 08/07/2017   Procedure: LEFT HEART CATH AND CORS/GRAFTS ANGIOGRAPHY;  Surgeon: Alwyn Pea, MD;  Location: [facility] INVASIVE CV LAB;  Service: Cardiovascular;  Laterality: N/A;   LEFT HEART CATH AND CORS/GRAFTS ANGIOGRAPHY N/A 11/28/2019   Procedure: LEFT HEART CATH AND CORS/GRAFTS ANGIOGRAPHY;  Surgeon: Dalia Heading, MD;  Location: [facility] INVASIVE CV LAB;  Service: Cardiovascular;  Laterality: N/A;   LEFT HEART CATH AND CORS/GRAFTS ANGIOGRAPHY N/A 04/15/2021   Procedure: LEFT HEART CATH AND CORS/GRAFTS ANGIOGRAPHY;  Surgeon: Lamar Blinks, MD;  Location: [facility] INVASIVE CV LAB;  Service: Cardiovascular;  Laterality: N/A;    PROSTATE SURGERY     SHOULDER ARTHROSCOPY      Social History:  reports that he has never smoked. His smokeless tobacco use includes chew. He reports that he does not currently use drugs. He reports that he does not drink alcohol.  Family History:  Family History  Problem Relation Age of Onset   Heart disease Mother    Heart disease Father    Diabetes Sister    Diabetes Brother    Heart disease Brother      Prior to Admission medications   Medication Sig Start Date End Date Taking? Authorizing Provider  aspirin EC 81 MG tablet Take 81 mg by mouth daily. 02/28/22   [provider]  atorvastatin (LIPITOR) 80 MG tablet Take 80 mg by mouth daily.     [provider]  blood glucose meter kit and supplies Dispense based on patient and insurance preference. Use up to four times daily as directed. (FOR ICD-10 E10.9, E11.9). 11/29/19   Lurene Shadow, MD  clopidogrel (PLAVIX) 75 MG tablet Take 75 mg by mouth daily. 02/28/22   [provider]  FARXIGA 10 MG TABS tablet Take 10 mg by mouth daily. 02/05/21   [provider]  HUMALOG KWIKPEN 100 UNIT/ML KwikPen Inject 4 Units into the skin 3 (three) times daily. 08/19/21   [provider]  insulin glargine (LANTUS) 100 UNIT/ML Solostar Pen Inject 20 Units into the skin at bedtime. 04/15/21   Alford Highland, MD  Insulin Pen Needle 32G X 4 MM MISC 1 application by Does not apply route at bedtime. 11/29/19   Lurene Shadow, MD  isosorbide mononitrate (IMDUR) 60 MG 24 hr tablet Take 1 tablet (60 mg total) by mouth daily. 10/29/21 03/26/22  Lynn Ito, MD  ketorolac (ACULAR) 0.5 % ophthalmic solution SMARTSIG:In Eye(s) 09/23/21   [provider]  lisinopril (ZESTRIL) 10 MG tablet Take 1 tablet by mouth daily. 08/19/21   [provider]  metoprolol tartrate (LOPRESSOR) 25 MG tablet Take 1 tablet (25 mg total) by mouth 2 (two) times daily. 11/29/19   Lurene Shadow, MD  nitroGLYCERIN (NITROSTAT) 0.4  MG SL tablet Place 1 tablet (0.4 mg total) under the tongue every 5 (five) minutes as needed for chest pain. 01/10/21   Irean Hong, MD  pantoprazole (PROTONIX) 40 MG tablet Take 1 tablet (40 mg total) by mouth daily. 01/18/19 03/26/22  Midge Minium, MD    Physical Exam: Vitals:   11/13/22 1030 11/13/22 1100 11/13/22 1152 11/13/22 1523  BP: (!) 144/83 124/63  (!) 144/82  Pulse: (!) 40 (!) 56  (!) 47  Resp: 18 16  17   Temp:   98.4 F (36.9 C) 98.7 F (37.1 C)  TempSrc:    Oral  SpO2: 95% 100%  99%  Weight:      Height:       General: Not in acute distress HEENT:       Eyes: PERRL, EOMI, no jaundice       ENT: No discharge from the ears and nose, no pharynx injection, no tonsillar enlargement.        Neck: No JVD, no bruit, no mass felt. Heme: No neck lymph node enlargement. Cardiac:  S1/S2, RRR, No murmurs, No gallops or rubs. Respiratory: No rales, wheezing, rhonchi or rubs. GI: Soft, nondistended, nontender, no rebound pain, no organomegaly, BS present. GU: No hematuria Ext: No pitting leg edema bilaterally. 1+DP/PT pulse bilaterally. Musculoskeletal: No joint deformities, No joint redness or warmth, no limitation of ROM in spin. Skin: No rashes.  Neuro: Alert, oriented X3, cranial nerves II-XII grossly intact, moves all extremities normally. Psych: Patient is not psychotic, no suicidal or hemocidal ideation.  Labs on Admission: I have personally reviewed following labs and imaging studies  CBC: Recent Labs  Lab 11/13/22 0708  WBC 9.4  HGB 12.1*  HCT 37.4*  MCV 89.3  PLT 280   Basic Metabolic Panel: Recent Labs  Lab 11/13/22 0708  NA 135  K 4.1  CL 106  CO2 19*  GLUCOSE 343*  BUN 25*  CREATININE 1.78*  CALCIUM 9.0   GFR: Estimated Creatinine Clearance: 44 mL/min (A) (by C-G formula based on SCr of 1.78 mg/dL (H)). Liver Function Tests: No results for input(s): "AST", "ALT", "ALKPHOS", "BILITOT", "PROT", "ALBUMIN" in the last 168 hours. No results for  input(s): "LIPASE", "AMYLASE" in the last 168 hours. No results for input(s): "AMMONIA" in the last 168 hours. Coagulation Profile: Recent Labs  Lab 11/13/22 0708  INR 1.0   Cardiac Enzymes: No results for input(s): "CKTOTAL", "CKMB", "CKMBINDEX", "TROPONINI" in the last 168 hours. BNP (last 3 results) No results for input(s): "PROBNP" in the last 8760 hours. HbA1C: No results for input(s): "HGBA1C" in the last 72 hours. CBG: Recent Labs  Lab 11/13/22 1132 11/13/22 1620  GLUCAP 208* 173*   Lipid Profile: No results for input(s): "CHOL", "HDL", "LDLCALC", "TRIG", "CHOLHDL", "LDLDIRECT" in the last 72 hours. Thyroid Function Tests: No results for input(s): "TSH", "T4TOTAL", "FREET4", "T3FREE", "THYROIDAB" in the last 72 hours. Anemia Panel: No results for input(s): "VITAMINB12", "FOLATE", "FERRITIN", "TIBC", "IRON", "RETICCTPCT" in the last 72 hours. Urine analysis:    Component Value Date/Time   COLORURINE YELLOW (A) 04/13/2021 1734   APPEARANCEUR CLEAR (A) 04/13/2021 1734   LABSPEC 1.007 04/13/2021 1734   PHURINE 5.0 04/13/2021 1734   GLUCOSEU >=500 (A) 04/13/2021 1734   HGBUR NEGATIVE 04/13/2021 1734   BILIRUBINUR NEGATIVE 04/13/2021 1734   KETONESUR NEGATIVE 04/13/2021 1734   PROTEINUR NEGATIVE 04/13/2021 1734   NITRITE NEGATIVE 04/13/2021 1734   LEUKOCYTESUR NEGATIVE 04/13/2021 1734   Sepsis Labs: @LABRCNTIP (procalcitonin:4,lacticidven:4) )No results found for this or any previous visit (from the past 240 hour(s)).   Radiological Exams on Admission: DG Chest 2 View  Result Date: 11/13/2022 CLINICAL DATA:  Chest pain EXAM: CHEST - 2 VIEW COMPARISON:  08/25/2022 FINDINGS: Somewhat coarse perihilar bronchovascular markings as before. No new infiltrate or overt edema. Heart size and mediastinal contours are within normal limits. CABG markers. No effusion. Sternotomy wires IMPRESSION: No acute findings. Electronically Signed   By: Corlis Leak M.D.   On: 11/13/2022 07:46       Assessment/Plan Principal Problem:   Acute non-ST elevation myocardial infarction (NSTEMI) (HCC) Active Problems:   CAD (coronary artery disease)   Essential hypertension   Dyslipidemia   Type II diabetes mellitus with renal manifestations (HCC)   Stage 3b chronic kidney disease (HCC)   Chronic diastolic CHF (congestive heart failure) (HCC)   Morbid obesity (HCC)   Assessment and Plan:   Acute non-ST elevation myocardial infarction (NSTEMI) (HCC) and hx of CAD: s/p of CABG. Trop 70 --> 305 --> 467. Consulted Dr. Darrold Junker of cardiology  - admit to  progressive unit as inpatient - IV heparin - Trend Trop - prn Nitroglycerin, Morphine,  -Imdur -aspirin, plavix, lipitor  - Risk factor stratification: will check FLP and A1C  - check UDS  Essential hypertension -IV hydralazine as needed -Imdur, lisinopril, metoprolol  Dyslipidemia -Lipitor  Type II diabetes mellitus with renal manifestations [facility]): Recent A1c 11.3, poorly controlled.  Patient is taking [place] and Lantus 20 U daily at home.  Blood sugar is 343 -Lantus 20 units daily -SSI  Stage 3b chronic kidney disease (HCC): Renal function is close to baseline.  Recent baseline creatinine 1.7 on 08/25/2022.  Her creatinine is 1.78, BUN 25, GFR 39 -Watch volume status closely  Chronic diastolic CHF (congestive heart failure) (HCC): 2D echo 10/28/2021 showed EF of 50-50% with grade 1 diastolic dysfunction.  Patient does not have leg edema JVD B CHF seem to be compensated.  BNP 91.2 -Watch volume status closely  Morbid obesity (HCC): Body weight 117.9 kg, BMI 39.53, patient has multiple completed his, therefore meets criteria for morbid obesity. -Exercise and healthy diet -Encourage losing weight       DVT ppx: on IV Heparin       Code Status: Full code     Family Communication: I have tried to call his brother who did not pick up the phone, I left a message to him.   Disposition Plan:  Anticipate discharge  back to previous environment  Consults called:  Dr. Darrold Junker of cardiology  Admission status and Level of care: Telemetry Cardiac:    as inpt       Dispo: The patient is from: Home              Anticipated d/c is to: Home              Anticipated d/c date is: 2 days              Patient currently is not medically stable to d/c.    Severity of Illness:  The appropriate patient status for this patient is INPATIENT. Inpatient status is judged to be reasonable and necessary in order to provide the required intensity of service to ensure the patient's safety. The patient's presenting symptoms, physical exam findings, and initial radiographic and laboratory data in the context of their chronic comorbidities is felt to place them at high risk for further clinical deterioration. Furthermore, it is not anticipated that the patient will be medically stable for discharge from the hospital within 2 midnights of admission.   * I certify that at the point of admission it is my clinical judgment that the patient will require inpatient hospital care spanning beyond 2 midnights from the point of admission due to high intensity of service, high risk for further deterioration and high frequency of surveillance required.*       Date of Service 11/13/2022    Lorretta Harp [facility]   If 7PM-7AM, please contact night-coverage [email or web address] 11/13/2022, 5:28 PM

## 2022-11-13 NOTE — Consult Note (Signed)
[facility] CLINIC CARDIOLOGY CONSULT NOTE       Patient ID: KAP MILTIMORE MRN: [id number] DOB/AGE: Feb 10, 1946 77 y.o.  Admit date: 11/13/2022 Referring Physician Dr. Clyde Lundborg Primary Physician [facility] Primary Cardiologist Dr. Juliann Pares Reason for Consultation NSTEMI  HPI: Justin Keith is a 6yoM with a PMH of CAD s/p CABG x 3 (LIMA-LAD, SVG-PDA, SVG-OM1 in 2005), HFpEF (50-55%, g1DD, mild MR 07/2021), HTN, HLD, GAD DM2, CKD 3, obesity who presented to [facility] ED 11/13/2022 with chest pain, abdominal pain, and a headache.  Troponins elevated and uptrending 70, 305, 467.  EKG with new slight ST depressions in the lateral leads, consistent with NSTEMI for which cardiology is consulted for further assistance.  The patient is somewhat of a difficult historian.  He was in his usual state of health earlier this week, able to perform his ADLs and without chest pain or dyspnea.  Early this morning around 4 AM he sat on the sofa to watch TV and started experiencing 10/10 "pain" in his "chest, stomach, throat, and head."  He has difficulty describing his chest pain but points to the center of his chest and the left side as to where it was hurting the most.  At home he took 4 baby aspirin and 1 sublingual nitroglycerin that brought the pain down to a 6/10.  Denies any associated nausea or diaphoresis.  Compliant with his medications but has a difficult time recalling them specifically.  At my time of evaluation this morning he is sitting upright in the ED stretcher "feeling good" but continues to have about 6/10 chest pain.  He is currently n.p.o. and is pending his home medications, including isosorbide.  Heparin drip is also ordered but pending starting.  He is slightly bradycardic on telemetry with rate in the 50s and sinus bradycardia, and normotensive.  Upon reevaluation this afternoon with Dr. Darrold Junker, the patient's chest pain has resolved, he still feels well and remains  hemodynamically stable.  His brother is present at bedside and they are visiting together.  Review of systems complete and found to be negative unless listed above     Past Medical History:  Diagnosis Date   CAD (coronary artery disease)    CKD (chronic kidney disease)    Diabetes mellitus without complication (HCC)    GERD (gastroesophageal reflux disease)    Hyperlipemia    Hypertension     Past Surgical History:  Procedure Laterality Date   CARDIAC CATHETERIZATION     CARDIAC SURGERY     COLONOSCOPY WITH PROPOFOL N/A 02/25/2018   Procedure: COLONOSCOPY WITH PROPOFOL;  Surgeon: Wyline Mood, MD;  Location[facility] ENDOSCOPY;  Service: Gastroenterology;  Laterality: N/A;   COLONOSCOPY WITH PROPOFOL N/A 02/26/2018   Procedure: COLONOSCOPY WITH PROPOFOL;  Surgeon: Wyline Mood, MD;  Location: [facility] ENDOSCOPY;  Service: Gastroenterology;  Laterality: N/A;   ESOPHAGOGASTRODUODENOSCOPY (EGD) WITH PROPOFOL N/A 01/18/2019   Procedure: ESOPHAGOGASTRODUODENOSCOPY (EGD) WITH PROPOFOL;  Surgeon: Midge Minium, MD;  Location: [facility] ENDOSCOPY;  Service: Endoscopy;  Laterality: N/A;   FOREIGN BODY REMOVAL N/A 03/07/2018   Procedure: FOREIGN BODY REMOVAL;  Surgeon: Wyline Mood, MD;  Location: [facility] ENDOSCOPY;  Service: Gastroenterology;  Laterality: N/A;   LEFT HEART CATH AND CORS/GRAFTS ANGIOGRAPHY N/A 08/07/2017   Procedure: LEFT HEART CATH AND CORS/GRAFTS ANGIOGRAPHY;  Surgeon: Alwyn Pea, MD;  Location: [facility] INVASIVE CV LAB;  Service: Cardiovascular;  Laterality: N/A;   LEFT HEART CATH AND CORS/GRAFTS ANGIOGRAPHY N/A 11/28/2019   Procedure: LEFT HEART CATH  AND CORS/GRAFTS ANGIOGRAPHY;  Surgeon: Dalia Heading, MD;  Location: [facility] INVASIVE CV LAB;  Service: Cardiovascular;  Laterality: N/A;   LEFT HEART CATH AND CORS/GRAFTS ANGIOGRAPHY N/A 04/15/2021   Procedure: LEFT HEART CATH AND CORS/GRAFTS ANGIOGRAPHY;  Surgeon: Lamar Blinks, MD;  Location: [facility] INVASIVE CV LAB;  Service: Cardiovascular;   Laterality: N/A;   PROSTATE SURGERY     SHOULDER ARTHROSCOPY      (Not in a hospital admission)  Social History   Socioeconomic History   Marital status: Single    Spouse name: Not on file   Number of children: Not on file   Years of education: Not on file   Highest education level: Not on file  Occupational History   Not on file  Tobacco Use   Smoking status: Never   Smokeless tobacco: Current    Types: Chew  Vaping Use   Vaping Use: Never used  Substance and Sexual Activity   Alcohol use: No   Drug use: Not Currently   Sexual activity: Not on file  Other Topics Concern   Not on file  Social History Narrative   Not on file   Social Determinants of Health   Financial Resource Strain: Not on file  Food Insecurity: No Food Insecurity (03/26/2022)   Hunger Vital Sign    Worried About Running Out of Food in the Last Year: Never true    Ran Out of Food in the Last Year: Never true  Transportation Needs: No Transportation Needs (03/26/2022)   PRAPARE - Administrator, Civil Service (Medical): No    Lack of Transportation (Non-Medical): No  Physical Activity: Not on file  Stress: Not on file  Social Connections: Not on file  Intimate Partner Violence: Not At Risk (03/26/2022)   Humiliation, Afraid, Rape, and Kick questionnaire    Fear of Current or Ex-Partner: No    Emotionally Abused: No    Physically Abused: No    Sexually Abused: No    Family History  Problem Relation Age of Onset   Heart disease Mother    Heart disease Father    Diabetes Sister    Diabetes Brother    Heart disease Brother      No intake or output data in the 24 hours ending 11/13/22 1605  Vitals:   11/13/22 1030 11/13/22 1100 11/13/22 1152 11/13/22 1523  BP: (!) 144/83 124/63  (!) 144/82  Pulse: (!) 40 (!) 56  (!) 47  Resp: 18 16  17   Temp:   98.4 F (36.9 C) 98.7 F (37.1 C)  TempSrc:    Oral  SpO2: 95% 100%  99%  Weight:      Height:        PHYSICAL EXAM General:  Pleasant black male, well nourished, in no acute distress.  Sitting at incline in ED stretcher, brother present at bedside. HEENT:  Normocephalic and atraumatic. Neck:  No JVD.  Lungs: Normal respiratory effort on room air. Clear bilaterally to auscultation. No wheezes, crackles, rhonchi.  Heart: Bradycardic but regular with occasional ectopy. Normal S1 and S2 without gallops or murmurs.  Abdomen: Non-distended appearing with excess adiposity.  Msk: Normal strength and tone for age. Extremities: Warm and well perfused. No clubbing, cyanosis.  No significant peripheral edema.  Neuro: Alert and oriented X 3. Psych: Limited historian.  Answers questions appropriately.   Labs: Basic Metabolic Panel: Recent Labs    11/13/22 0708  NA 135  K 4.1  CL 106  CO2 19*  GLUCOSE 343*  BUN 25*  CREATININE 1.78*  CALCIUM 9.0   Liver Function Tests: No results for input(s): "AST", "ALT", "ALKPHOS", "BILITOT", "PROT", "ALBUMIN" in the last 72 hours. No results for input(s): "LIPASE", "AMYLASE" in the last 72 hours. CBC: Recent Labs    11/13/22 0708  WBC 9.4  HGB 12.1*  HCT 37.4*  MCV 89.3  PLT 280   Cardiac Enzymes: Recent Labs    11/13/22 0708 11/13/22 0950 11/13/22 1150  TROPONINIHS 70* 305* 467*   BNP: Recent Labs    11/13/22 0708  BNP 91.2   D-Dimer: No results for input(s): "DDIMER" in the last 72 hours. Hemoglobin A1C: No results for input(s): "HGBA1C" in the last 72 hours. Fasting Lipid Panel: No results for input(s): "CHOL", "HDL", "LDLCALC", "TRIG", "CHOLHDL", "LDLDIRECT" in the last 72 hours. Thyroid Function Tests: No results for input(s): "TSH", "T4TOTAL", "T3FREE", "THYROIDAB" in the last 72 hours.  Invalid input(s): "FREET3" Anemia Panel: No results for input(s): "VITAMINB12", "FOLATE", "FERRITIN", "TIBC", "IRON", "RETICCTPCT" in the last 72 hours.   Radiology: DG Chest 2 View  Result Date: 11/13/2022 CLINICAL DATA:  Chest pain EXAM: CHEST - 2 VIEW  COMPARISON:  08/25/2022 FINDINGS: Somewhat coarse perihilar bronchovascular markings as before. No new infiltrate or overt edema. Heart size and mediastinal contours are within normal limits. CABG markers. No effusion. Sternotomy wires IMPRESSION: No acute findings. Electronically Signed   By: Corlis Leak M.D.   On: 11/13/2022 07:46    ECHO 10/28/2021  1. Left ventricular ejection fraction, by estimation, is 50 to 55%. The  left ventricle has low normal function. The left ventricle has no regional  wall motion abnormalities. Left ventricular diastolic parameters are  consistent with Grade I diastolic  dysfunction (impaired relaxation).   2. Right ventricular systolic function is normal. The right ventricular  size is normal.   3. The mitral valve is normal in structure. Mild mitral valve  regurgitation. No evidence of mitral stenosis.   4. The aortic valve is normal in structure. Aortic valve regurgitation is  not visualized. Aortic valve sclerosis is present, with no evidence of  aortic valve stenosis.   5. There is mild dilatation of the aortic root, measuring 41 mm.   6. The inferior vena cava is normal in size with greater than 50%  respiratory variability, suggesting right atrial pressure of 3 mmHg.   04/15/2021     Mid LM lesion is 25% stenosed.   Ost LAD lesion is 100% stenosed.   Ost Cx lesion is 50% stenosed.   Dist Cx lesion is 50% stenosed.   Ost RCA to Prox RCA lesion is 100% stenosed.   Ost 1st Mrg lesion is 100% stenosed.   Ost 2nd Mrg lesion is 100% stenosed.   1st RPL lesion is 50% stenosed.   Prox Cx lesion is 75% stenosed.   LV end diastolic pressure is normal.   77 year old male with known hypertension hyperlipidemia and previous coronary bypass graft with progressive episodes of chest discomfort concerning for unstable angina without evidence of acute coronary syndrome and/or myocardial infarction   Native vessels 100% occlusion of proximal right coronary artery,  proximal LAD, proximal obtuse marginal 1 Patent graft to PDA, obtuse marginal 1, and LIMA to LAD Moderate unchanged stenosis of 70% of mid circumflex artery to obtuse marginal 2   Plan Continue medication management for further risk reduction cardiovascular event including isosorbide beta-blocker and calcium channel blocker and high intensity cholesterol therapy No further cardiac diagnostics  necessary at this time Begin ambulation and follow-up for improvements of symptoms Okay for discharged home with cardiac rehabilitation    TELEMETRY reviewed by me (LT) 11/13/2022 : Sinus bradycardia to sinus rhythm rate 50s to 60s with occasional PVCs.  EKG reviewed by me: Sinus bradycardia rate 51 with slight ST depressions in V5 V6 without reciprocal ST elevations or other concerning new ischemic changes compared to priors.  Data reviewed by me (LT) 11/13/2022: Last outpatient cardiology note, prior hospitalization notes, ED note, nursing notes. last 24h vitals tele labs imaging I/O   Principal Problem:   Acute non-ST elevation myocardial infarction (NSTEMI) (HCC) Active Problems:   Essential hypertension   HLD (hyperlipidemia)   CAD (coronary artery disease)   NSTEMI (non-ST elevated myocardial infarction) (HCC)   Stage 3b chronic kidney disease (HCC)   Dyslipidemia   Morbid obesity (HCC)   Type II diabetes mellitus with renal manifestations (HCC)   Chronic diastolic CHF (congestive heart failure) (HCC)    ASSESSMENT AND PLAN:  Justin Keith is a 63yoM with a PMH of CAD s/p CABG x 3 (LIMA-LAD, SVG-PDA, SVG-OM1 in 2005), HFpEF (50-55%, g1DD, mild MR 07/2021), HTN, HLD, DM2, CKD 3, obesity who presented to [facility] ED 11/13/2022 with chest pain, abdominal pain, and a headache.  Troponins elevated and uptrending 70, 305, 467.  EKG with new slight ST depressions in the lateral leads, consistent with NSTEMI for which cardiology is consulted for further assistance.  # NSTEMI # CAD s/p CABG x 3 in  2005 Presents with sudden onset substernal chest pain occurring at rest, radiating to his left chest, and neck and associated with abdominal discomfort and a headache.  Improved after 325 mg aspirin and 1 sublingual nitroglycerin prior to arrival, terminated after receiving home antianginals in the emergency department.  Enzymes are elevated to a current peak of 467 with repeats pending, EKG with new mild ST depressions in lead V5, V6 flattening in 1 and aVL.  Currently chest pain-free upon reevaluation this afternoon. -S/p 325 mg aspirin, continue aspirin 81 mg daily and Plavix 75 mg daily -Continue heparin drip for 48 hours, ending 6/15 -risk factor modification with lipid panel, A1c -High intensity atorvastatin 80 mg daily -Increase Imdur from 60mg  daily to 90mg  daily  -Metoprolol with hold parameters for bradycardia -Discussed in detail with Dr. Darrold Junker, continue heparin drip and adjustment of antianginals as above.  If the patient continues to be chest pain-free and feels well, may continue conservative management of non-STEMI.  With worsening chest pain or  EKG changes, may consider LHC in view of his grafts tomorrow morning.  Will keep n.p.o. at midnight and evaluate early and make a final determination regarding invasive cardiac diagnostics versus continued medical management.  # Chronic HFpEF Euvolemic on exam.  Repeat echo pending.  # CKD 3 Renal function around baseline with BUN/creatinine 25/1.78 and GFR of 39. Continue to monitor closely  # DM2 Recheck A1c as above, glucose elevated in the 170s to 300s   This patient's plan of care was discussed and created with Dr. Darrold Junker and he is in agreement.  Signed: Rebeca Allegra , PA-C 11/13/2022, 4:05 PM [facility] Card[facility]

## 2022-11-14 ENCOUNTER — Inpatient Hospital Stay
Admit: 2022-11-14 | Discharge: 2022-11-14 | Disposition: A | Discharge status: Home / Self Care | Payer: 59 | Attending: Cardiology | Admitting: Cardiology

## 2022-11-14 DIAGNOSIS — I214 Non-ST elevation (NSTEMI) myocardial infarction: Secondary | ICD-10-CM | POA: Diagnosis not present

## 2022-11-14 LAB — CBC
HCT: 38.6 % — ABNORMAL LOW (ref 39.0–52.0)
Hemoglobin: 12.6 g/dL — ABNORMAL LOW (ref 13.0–17.0)
MCH: 29.4 pg (ref 26.0–34.0)
MCHC: 32.6 g/dL (ref 30.0–36.0)
MCV: 90 fL (ref 80.0–100.0)
Platelets: 265 10*3/uL (ref 150–400)
RBC: 4.29 MIL/uL (ref 4.22–5.81)
RDW: 15 % (ref 11.5–15.5)
WBC: 10.5 10*3/uL (ref 4.0–10.5)
nRBC: 0 % (ref 0.0–0.2)

## 2022-11-14 LAB — BASIC METABOLIC PANEL
Anion gap: 9 (ref 5–15)
BUN: 22 mg/dL (ref 8–23)
CO2: 20 mmol/L — ABNORMAL LOW (ref 22–32)
Calcium: 9.1 mg/dL (ref 8.9–10.3)
Chloride: 108 mmol/L (ref 98–111)
Creatinine, Ser: 1.25 mg/dL — ABNORMAL HIGH (ref 0.61–1.24)
GFR, Estimated: 60 mL/min — ABNORMAL LOW (ref >60)
Glucose, Bld: 165 mg/dL — ABNORMAL HIGH (ref 70–99)
Potassium: 3.9 mmol/L (ref 3.5–5.1)
Sodium: 137 mmol/L (ref 135–145)

## 2022-11-14 LAB — CBG MONITORING, ED
Glucose-Capillary: 171 mg/dL — ABNORMAL HIGH (ref 70–99)
Glucose-Capillary: 173 mg/dL — ABNORMAL HIGH (ref 70–99)

## 2022-11-14 LAB — ECHOCARDIOGRAM COMPLETE
Height: 68 in
S' Lateral: 3.5 cm
Weight: 4160 oz

## 2022-11-14 LAB — HEMOGLOBIN A1C
Hgb A1c MFr Bld: 11.5 % — ABNORMAL HIGH (ref 4.8–5.6)
Mean Plasma Glucose: 283.35 mg/dL

## 2022-11-14 LAB — GLUCOSE, CAPILLARY
Glucose-Capillary: 176 mg/dL — ABNORMAL HIGH (ref 70–99)
Glucose-Capillary: 214 mg/dL — ABNORMAL HIGH (ref 70–99)

## 2022-11-14 LAB — HEPARIN LEVEL (UNFRACTIONATED): Heparin Unfractionated: 0.35 IU/mL (ref 0.30–0.70)

## 2022-11-14 LAB — LIPID PANEL
Cholesterol: 188 mg/dL (ref 0–200)
HDL: 32 mg/dL — ABNORMAL LOW (ref >40)
LDL Cholesterol: 114 mg/dL — ABNORMAL HIGH (ref 0–99)
Total CHOL/HDL Ratio: 5.9 RATIO
Triglycerides: 208 mg/dL — ABNORMAL HIGH (ref <150)
VLDL: 42 mg/dL — ABNORMAL HIGH (ref 0–40)

## 2022-11-14 LAB — TROPONIN I (HIGH SENSITIVITY)
Troponin I (High Sensitivity): 464 ng/L (ref <18)
Troponin I (High Sensitivity): 345 ng/L (ref <18)

## 2022-11-14 MED ORDER — DOCUSATE SODIUM 100 MG PO CAPS
200.0000 mg | ORAL_CAPSULE | Freq: Two times a day (BID) | ORAL | Status: DC
  Administered 2022-11-14 (×2): 200 mg via ORAL
  Filled 2022-11-14 (×3): qty 2

## 2022-11-14 NOTE — Progress Notes (Signed)
[facility]  SUBJECTIVE: Patient laying in bed, denies chest pain or shortness of breath   Vitals:   11/14/22 0545 11/14/22 0548 11/14/22 0630 11/14/22 0800  BP:   (!) 144/84 (!) 153/72  Pulse:   (!) 46 (!) 48  Resp:   13 15  Temp: 97.8 F (36.6 C) 97.8 F (36.6 C)  98.4 F (36.9 C)  TempSrc: Oral Oral  Oral  SpO2:   98% 98%  Weight:      Height:         Intake/Output Summary (Last 24 hours) at 11/14/2022 1610 Last data filed at 11/13/2022 2258 Gross per 24 hour  Intake --  Output 350 ml  Net -350 ml      PHYSICAL EXAM  General: Well developed, well nourished, in no acute distress HEENT:  Normocephalic and atramatic Neck:  No JVD.  Lungs: Clear bilaterally to auscultation and percussion. Heart: HRRR . Normal S1 and S2 without gallops or murmurs.  Abdomen: Bowel sounds are positive, abdomen soft and non-tender  Msk:  Back normal, normal gait. Normal strength and tone for age. Extremities: No clubbing, cyanosis or edema.   Neuro: Alert and oriented X 3. Psych:  Good affect, responds appropriately   LABS: Basic Metabolic Panel: Recent Labs    11/13/22 0708 11/14/22 0504  NA 135 137  K 4.1 3.9  CL 106 108  CO2 19* 20*  GLUCOSE 343* 165*  BUN 25* 22  CREATININE 1.78* 1.25*  CALCIUM 9.0 9.1   Liver Function Tests: No results for input(s): "AST", "ALT", "ALKPHOS", "BILITOT", "PROT", "ALBUMIN" in the last 72 hours. No results for input(s): "LIPASE", "AMYLASE" in the last 72 hours. CBC: Recent Labs    11/13/22 0708 11/14/22 0504  WBC 9.4 10.5  HGB 12.1* 12.6*  HCT 37.4* 38.6*  MCV 89.3 90.0  PLT 280 265   Cardiac Enzymes: No results for input(s): "CKTOTAL", "CKMB", "CKMBINDEX", "TROPONINI" in the last 72 hours. BNP: Invalid input(s): "POCBNP" D-Dimer: No results for input(s): "DDIMER" in the last 72 hours. Hemoglobin A1C: No results for input(s): "HGBA1C" in the last 72 hours. Fasting Lipid Panel: Recent Labs    11/14/22 0504  CHOL 188   HDL 32*  LDLCALC 114*  TRIG 208*  CHOLHDL 5.9   Thyroid Function Tests: No results for input(s): "TSH", "T4TOTAL", "T3FREE", "THYROIDAB" in the last 72 hours.  Invalid input(s): "FREET3" Anemia Panel: No results for input(s): "VITAMINB12", "FOLATE", "FERRITIN", "TIBC", "IRON", "RETICCTPCT" in the last 72 hours.  DG Chest 2 View  Result Date: 11/13/2022 CLINICAL DATA:  Chest pain EXAM: CHEST - 2 VIEW COMPARISON:  08/25/2022 FINDINGS: Somewhat coarse perihilar bronchovascular markings as before. No new infiltrate or overt edema. Heart size and mediastinal contours are within normal limits. CABG markers. No effusion. Sternotomy wires IMPRESSION: No acute findings. Electronically Signed   By: Corlis Leak M.D.   On: 11/13/2022 07:46     Echo pending  TELEMETRY: Sinus bradycardia 56 bpm:  ASSESSMENT AND PLAN:  Principal Problem:   Acute non-ST elevation myocardial infarction (NSTEMI) (HCC) Active Problems:   Essential hypertension   CAD (coronary artery disease)   Stage 3b chronic kidney disease (HCC)   Dyslipidemia   Morbid obesity (HCC)   Type II diabetes mellitus with renal manifestations (HCC)   Chronic diastolic CHF (congestive heart failure) (HCC)    1.  NSTEMI with mildly elevated high-sensitivity troponin (70, 305, 467, 448, 725, 697, 464), now chest pain-free 2.  Status post CABG x 3, with  LIMA-LAD, SVG-PDA and OM1, with patent grafts by cardiac catheterization 04/15/2021 3.  Chronic HFpEF 4.  CKD, BUN and creatinine 22 and 1.25, on 11/14/2022  Recommendations  1.  Agree with current therapy 2.  Conservative management with heparin drip x 48 hours, to be discontinued 8 AM 11/15/2022 3.  Continue DAPT 4.  Continue isosorbide mononitrate 90 mg daily 5.  Continue high intensity atorvastatin 80 mg daily   Marcina Millard, MD, PhD, [facility] 11/14/2022 8:42 AM

## 2022-11-14 NOTE — Progress Notes (Signed)
*  PRELIMINARY RESULTS* Echocardiogram 2D Echocardiogram has been performed.  Justin Keith 11/14/2022, 8:51 AM

## 2022-11-14 NOTE — Progress Notes (Signed)
PROGRESS NOTE    Justin Keith  ZOX:[id number] DOB: 1945/11/18 DOA: 11/13/2022 [facility]CP: [facility]    Assessment & Plan:   Principal Problem:   Acute non-ST elevation myocardial infarction (NSTEMI) [facility]) Active Problems:   CAD (coronary artery disease)   Essential hypertension   Dyslipidemia   Type II diabetes mellitus with renal manifestations (HCC)   Stage 3b chronic kidney disease (HCC)   Chronic diastolic CHF (congestive heart failure) (HCC)   Morbid obesity (HCC)  Assessment and Plan:  NSTEMI: w/ hx of CAD s/p of CABG. Continue on IV heparin drip. Continue w/ medical management w/ aspirin, imdur, metoprolol, lisinopril, statin as per cardio. Continue on tele. Cardio following and recs apprec    HTN: continue on metoprolol, imdur, lisinopril. IV hydralazine prn    HLD: continue on statin    DM2: poorly controlled, HbA1c 11.3. Continue on glargine, SSI w/ accuchecks  CKDIIIb: baseline creatinine 1.7. Cr is better than baseline currently    Chronic diastolic CHF: echo 10/28/2021 showed EF of 50-50%, grade 1 diastolic dysfunction. Compensated. Monitor I/Os    Morbid obesity: BMI 39.5 w/ multiple other comorbidities, like CHF, CKD,CAD. Complicates overall care & prognosis       DVT prophylaxis: heparin  Code Status: full  Family Communication:  Disposition Plan:  depends on PT/OT recs (not consulted yet)   Level of care: Telemetry Cardiac  Status is: Inpatient Remains inpatient appropriate because: severity of illness   Consultants:  Cardio   Procedures:   Antimicrobials:    Subjective: Pt c/o malaise   Objective: Vitals:   11/14/22 0545 11/14/22 0548 11/14/22 0630 11/14/22 0800  BP:   (!) 144/84 (!) 153/72  Pulse:   (!) 46 (!) 48  Resp:   13 15  Temp: 97.8 F (36.6 C) 97.8 F (36.6 C)  98.4 F (36.9 C)  TempSrc: Oral Oral  Oral  SpO2:   98% 98%  Weight:      Height:        Intake/Output Summary (Last 24 hours) at  11/14/2022 0836 Last data filed at 11/13/2022 2258 Gross per 24 hour  Intake --  Output 350 ml  Net -350 ml   Filed Weights   11/13/22 0705  Weight: 117.9 kg    Examination:  General exam: Appears calm and comfortable  Respiratory system: Clear to auscultation. Respiratory effort normal. Cardiovascular system: S1 & S2+ No  rubs, gallops or clicks.  Gastrointestinal system: Abdomen is obese, soft and nontender. Normal bowel sounds heard. Central nervous system: Alert and oriented. Moves all extremities  Psychiatry: Judgement and insight appear normal. Mood & affect appropriate.     Data Reviewed: I have personally reviewed following labs and imaging studies  CBC: Recent Labs  Lab 11/13/22 0708 11/14/22 0504  WBC 9.4 10.5  HGB 12.1* 12.6*  HCT 37.4* 38.6*  MCV 89.3 90.0  PLT 280 265   Basic Metabolic Panel: Recent Labs  Lab 11/13/22 0708 11/14/22 0504  NA 135 137  K 4.1 3.9  CL 106 108  CO2 19* 20*  GLUCOSE 343* 165*  BUN 25* 22  CREATININE 1.78* 1.25*  CALCIUM 9.0 9.1   GFR: Estimated Creatinine Clearance: 62.7 mL/min (A) (by C-G formula based on SCr of 1.25 mg/dL (H)). Liver Function Tests: No results for input(s): "AST", "ALT", "ALKPHOS", "BILITOT", "PROT", "ALBUMIN" in the last 168 hours. No results for input(s): "LIPASE", "AMYLASE" in the last 168 hours. No results for input(s): "AMMONIA" in the last  168 hours. Coagulation Profile: Recent Labs  Lab 11/13/22 0708  INR 1.0   Cardiac Enzymes: No results for input(s): "CKTOTAL", "CKMB", "CKMBINDEX", "TROPONINI" in the last 168 hours. BNP (last 3 results) No results for input(s): "PROBNP" in the last 8760 hours. HbA1C: No results for input(s): "HGBA1C" in the last 72 hours. CBG: Recent Labs  Lab 11/13/22 1132 11/13/22 1620 11/13/22 2115 11/14/22 0820  GLUCAP 208* 173* 162* 171*   Lipid Profile: Recent Labs    11/14/22 0504  CHOL 188  HDL 32*  LDLCALC 114*  TRIG 208*  CHOLHDL 5.9    Thyroid Function Tests: No results for input(s): "TSH", "T4TOTAL", "FREET4", "T3FREE", "THYROIDAB" in the last 72 hours. Anemia Panel: No results for input(s): "VITAMINB12", "FOLATE", "FERRITIN", "TIBC", "IRON", "RETICCTPCT" in the last 72 hours. Sepsis Labs: No results for input(s): "PROCALCITON", "LATICACIDVEN" in the last 168 hours.  No results found for this or any previous visit (from the past 240 hour(s)).       Radiology Studies: DG Chest 2 View  Result Date: 11/13/2022 CLINICAL DATA:  Chest pain EXAM: CHEST - 2 VIEW COMPARISON:  08/25/2022 FINDINGS: Somewhat coarse perihilar bronchovascular markings as before. No new infiltrate or overt edema. Heart size and mediastinal contours are within normal limits. CABG markers. No effusion. Sternotomy wires IMPRESSION: No acute findings. Electronically Signed   By: Corlis Leak M.D.   On: 11/13/2022 07:46        Scheduled Meds:  aspirin EC  81 mg Oral Daily   atorvastatin  80 mg Oral Daily   clopidogrel  75 mg Oral Daily   docusate sodium  200 mg Oral BID   insulin aspart  0-5 Units Subcutaneous QHS   insulin aspart  0-9 Units Subcutaneous TID WC   insulin glargine-yfgn  20 Units Subcutaneous QHS   isosorbide mononitrate  90 mg Oral Daily   metoprolol tartrate  25 mg Oral BID   pantoprazole  40 mg Oral Daily   Continuous Infusions:  heparin 1,250 Units/hr (11/14/22 0544)     LOS: 1 day    Time spent: 35 mins     Charise Killian, MD [facility] Pager [phone]  If 7PM-7AM, please contact night-coverage [email or web address] 11/14/2022, 8:36 AM

## 2022-11-14 NOTE — Consult Note (Signed)
ANTICOAGULATION CONSULT NOTE  Pharmacy Consult for Heparin Infusion Indication: chest pain/ACS  Patient Measurements: Height: 5\' 8"  (172.7 cm) Weight: 117.9 kg (260 lb) IBW/kg (Calculated) : 68.4 Heparin Dosing Weight: 95.2 kg  Labs: Recent Labs    11/13/22 0708 11/13/22 0950 11/13/22 1618 11/13/22 1934 11/13/22 2110 11/14/22 0504  HGB 12.1*  --   --   --   --  12.6*  HCT 37.4*  --   --   --   --  38.6*  PLT 280  --   --   --   --  265  APTT 32  --   --   --   --   --   LABPROT 13.0  --   --   --   --   --   INR 1.0  --   --   --   --   --   HEPARINUNFRC  --   --   --  0.32  --  0.35  CREATININE 1.78*  --   --   --   --   --   TROPONINIHS 70*   < > 448* 725* 697*  --    < > = values in this interval not displayed.    Estimated Creatinine Clearance: 44 mL/min (A) (by C-G formula based on SCr of 1.78 mg/dL (H)).  Medical History: Past Medical History:  Diagnosis Date   CAD (coronary artery disease)    CKD (chronic kidney disease)    Diabetes mellitus without complication (HCC)    GERD (gastroesophageal reflux disease)    Hyperlipemia    Hypertension     Medications:  No prior anticoagulation noted   Assessment: 77 y.o. male past medical history significant for CAD with prior CABG, obesity, hypertension, hyperlipidemia, HFrEF, presented to ED with chest pain that started while watching TV. Pain radiates to neck and lower abdomen. Troponin I initial 70 > 305 > 467. Pharmacy has been consulted to initiate and manage IV heparin therapy.    6213 1934 HL 0.32, therapeutic x 1; 1250 un/hr 0614 0504 HL 0.35, therapeutic x 2  Goal of Therapy:  Heparin level 0.3-0.7 units/ml Monitor platelets by anticoagulation protocol: Yes   Plan:  --Heparin level is therapeutic x 2 --Continue heparin infusion at 1250 units/hr --Re-check HL daily w/ AM labs while therapeutic --Daily CBC per protocol while on IV heparin  Otelia Sergeant, PharmD, [facility] 11/14/2022 5:36 AM

## 2022-11-15 DIAGNOSIS — I214 Non-ST elevation (NSTEMI) myocardial infarction: Secondary | ICD-10-CM | POA: Diagnosis not present

## 2022-11-15 LAB — CBC
HCT: 37.3 % — ABNORMAL LOW (ref 39.0–52.0)
Hemoglobin: 12.2 g/dL — ABNORMAL LOW (ref 13.0–17.0)
MCH: 29.2 pg (ref 26.0–34.0)
MCHC: 32.7 g/dL (ref 30.0–36.0)
MCV: 89.2 fL (ref 80.0–100.0)
Platelets: 290 10*3/uL (ref 150–400)
RBC: 4.18 MIL/uL — ABNORMAL LOW (ref 4.22–5.81)
RDW: 14.7 % (ref 11.5–15.5)
WBC: 10.3 10*3/uL (ref 4.0–10.5)
nRBC: 0 % (ref 0.0–0.2)

## 2022-11-15 LAB — BASIC METABOLIC PANEL
Anion gap: 8 (ref 5–15)
BUN: 24 mg/dL — ABNORMAL HIGH (ref 8–23)
CO2: 22 mmol/L (ref 22–32)
Calcium: 8.8 mg/dL — ABNORMAL LOW (ref 8.9–10.3)
Chloride: 105 mmol/L (ref 98–111)
Creatinine, Ser: 1.43 mg/dL — ABNORMAL HIGH (ref 0.61–1.24)
GFR, Estimated: 51 mL/min — ABNORMAL LOW (ref >60)
Glucose, Bld: 156 mg/dL — ABNORMAL HIGH (ref 70–99)
Potassium: 3.9 mmol/L (ref 3.5–5.1)
Sodium: 135 mmol/L (ref 135–145)

## 2022-11-15 LAB — GLUCOSE, CAPILLARY
Glucose-Capillary: 160 mg/dL — ABNORMAL HIGH (ref 70–99)
Glucose-Capillary: 189 mg/dL — ABNORMAL HIGH (ref 70–99)

## 2022-11-15 LAB — HEPARIN LEVEL (UNFRACTIONATED): Heparin Unfractionated: 0.35 IU/mL (ref 0.30–0.70)

## 2022-11-15 MED ORDER — RANOLAZINE ER 500 MG PO TB12
500.0000 mg | ORAL_TABLET | Freq: Two times a day (BID) | ORAL | Status: DC
  Administered 2022-11-15: 500 mg via ORAL
  Filled 2022-11-15: qty 1

## 2022-11-15 MED ORDER — ISOSORBIDE MONONITRATE ER 30 MG PO TB24: 90.0000 mg | ORAL_TABLET | Freq: Every day | ORAL | 0 refills | Status: AC

## 2022-11-15 MED ORDER — RANOLAZINE ER 500 MG PO TB12: 500.0000 mg | ORAL_TABLET | Freq: Two times a day (BID) | ORAL | 0 refills | Status: AC

## 2022-11-15 NOTE — Discharge Summary (Signed)
Physician Discharge Summary  MANAV SIGEL ZOX:[id number] DOB: 21-Feb-1946 DOA: 11/13/2022  PCP: [facility]  Admit date: 11/13/2022 Discharge date: 11/15/2022  Admitted From: home  Disposition:  home   Recommendations for Outpatient Follow-up:  Follow up with PCP in 1-2 weeks F/u w/ cardio, Dr. Juliann Pares, in 1-2 weeks  Home Health" no  Equipment/Devices:  Discharge Condition: stable  CODE STATUS: full  Diet recommendation: Heart Healthy / Carb Modified   Brief/Interim Summary: HPI was taken from Dr. Clyde Lundborg: Moises Blood is a 77 y.o. male with medical history significant of CAD, s/p of CBAG, HTN, HLD, dCHF, CKD-3b, obesity, who presents with chest pain.    Pt states that his chest pain started in early morning at about 4 AM, which is located in the substernal area and left side of chest, pressure-like, moderate initially, currently mild, intermittent, radiating to bilateral shoulders.  Not associated with shortness of breath.  No cough, fever or chills.  Patient denies nausea, vomiting, diarrhea or abdominal pain.  No symptoms of UTI.  Patient was given 324 mg of aspirin and nitroglycerin by EMS with improvement of his chest pain     Data reviewed independently and ED Course: pt was found to have trop 70 --> 305 --> 467, WBC 9.4, INR 1.0, liver function close to baseline, temperature 99.3, blood pressure 141/74, heart rate 57, RR 20, oxygen saturation 95% on room air.  Chest x-ray negative.  Patient is admitted to telemetry bed as inpatient. Dr. Darrold Junker of cardiology is consulted.    Discharge Diagnoses:  Principal Problem:   Acute non-ST elevation myocardial infarction (NSTEMI) (HCC) Active Problems:   CAD (coronary artery disease)   Essential hypertension   Dyslipidemia   Type II diabetes mellitus with renal manifestations (HCC)   Stage 3b chronic kidney disease (HCC)   Chronic diastolic CHF (congestive heart failure) (HCC)   Morbid obesity  (HCC)  NSTEMI: w/ hx of CAD s/p of CABG. D/c IV heparin drip. Continue w/ medical management w/ aspirin, imdur, metoprolol, lisinopril, statin, ranexa as per cardio. Continue on tele. Cardio following and recs apprec    HTN: continue on metoprolol, imdur, lisinopril. IV hydralazine prn    HLD: continue on statin    DM2: poorly controlled, HbA1c 11.3. Continue on glargine, SSI w/ accuchecks. Pt refused a carb modified diet and requested a regular diet. Received DM education  CKDIIIb: baseline creatinine 1.7.  Cr is labile   Chronic diastolic CHF: echo 10/28/2021 showed EF of 50-50%, grade 1 diastolic dysfunction. Compensated. Monitor I/Os    Morbid obesity: BMI 39.5 w/ multiple other comorbidities, like CHF, CKD,CAD. Complicates overall care & prognosis   Discharge Instructions  Discharge Instructions     Diet - low sodium heart healthy   Complete by: As directed    Diet Carb Modified   Complete by: As directed    Discharge instructions   Complete by: As directed    F/u w/ PCP in 1-2 weeks. F/u w/ cardio, Dr. Juliann Pares, in 1-2 weeks   Increase activity slowly   Complete by: As directed       Allergies as of 11/15/2022   No Known Allergies      Medication List     STOP taking these medications    HumaLOG KwikPen 100 UNIT/ML KwikPen Generic drug: insulin lispro   ketorolac 0.5 % ophthalmic solution Commonly known as: ACULAR       TAKE these medications    aspirin EC 81 MG  tablet Take 81 mg by mouth daily.   atorvastatin 80 MG tablet Commonly known as: LIPITOR Take 80 mg by mouth daily.   blood glucose meter kit and supplies Dispense based on patient and insurance preference. Use up to four times daily as directed. (FOR ICD-10 E10.9, E11.9).   clopidogrel 75 MG tablet Commonly known as: PLAVIX Take 75 mg by mouth daily.   Farxiga 10 MG Tabs tablet Generic drug: dapagliflozin propanediol Take 10 mg by mouth daily.   insulin glargine 100 UNIT/ML Solostar  Pen Commonly known as: LANTUS Inject 20 Units into the skin at bedtime.   Insulin Pen Needle 32G X 4 MM Misc 1 application by Does not apply route at bedtime.   isosorbide mononitrate 30 MG 24 hr tablet Commonly known as: IMDUR Take 3 tablets (90 mg total) by mouth daily. Start taking on: November 16, 2022 What changed:  medication strength how much to take   lisinopril 10 MG tablet Commonly known as: ZESTRIL Take 1 tablet by mouth daily.   metoprolol tartrate 25 MG tablet Commonly known as: LOPRESSOR Take 1 tablet (25 mg total) by mouth 2 (two) times daily.   nitroGLYCERIN 0.4 MG SL tablet Commonly known as: NITROSTAT Place 1 tablet (0.4 mg total) under the tongue every 5 (five) minutes as needed for chest pain.   NovoLOG FlexPen 100 UNIT/ML FlexPen Generic drug: insulin aspart Inject 5 Units into the skin 3 (three) times daily with meals.   pantoprazole 40 MG tablet Commonly known as: Protonix Take 1 tablet (40 mg total) by mouth daily.   ranolazine 500 MG 12 hr tablet Commonly known as: RANEXA Take 1 tablet (500 mg total) by mouth 2 (two) times daily.        No Known Allergies  Consultations: cardio   Procedures/Studies: ECHOCARDIOGRAM COMPLETE  Result Date: 11/14/2022    ECHOCARDIOGRAM REPORT   Patient Name:   MEIR JENTSCH Date of Exam: 11/14/2022 Medical Rec #:  [id number]        Height:       68.0 in Accession #:    [id number]       Weight:       260.0 lb Date of Birth:  1945/07/10        BSA:          2.285 m Patient Age:    77 years         BP:           144/84 mmHg Patient Gender: M                HR:           46 bpm. Exam Location:  [facility] Procedure: 2D Echo, Cardiac Doppler and Color Doppler Indications:     Chest pain R07.9  History:         Patient has prior history of Echocardiogram examinations, most                  recent 10/28/2021. CAD, Prior CABG; Risk Factors:Hypertension                  and Diabetes. CKD.  Sonographer:     Cristela Blue Referring  Phys:  [id number] LILY MICHELLE TANG Diagnosing Phys: Marcina Millard MD  Sonographer Comments: Technically challenging study due to limited acoustic windows, no apical window and no subcostal window. IMPRESSIONS  1. Left ventricular ejection fraction, by estimation, is 50 to 55%. The left ventricle has low normal function.  The left ventricle has no regional wall motion abnormalities. Left ventricular diastolic parameters are indeterminate.  2. Right ventricular systolic function is normal. The right ventricular size is normal.  3. The mitral valve is normal in structure. Trivial mitral valve regurgitation. No evidence of mitral stenosis.  4. The aortic valve is normal in structure. Aortic valve regurgitation is not visualized. No aortic stenosis is present.  5. The inferior vena cava is normal in size with greater than 50% respiratory variability, suggesting right atrial pressure of 3 mmHg. FINDINGS  Left Ventricle: Left ventricular ejection fraction, by estimation, is 50 to 55%. The left ventricle has low normal function. The left ventricle has no regional wall motion abnormalities. The left ventricular internal cavity size was normal in size. There is no left ventricular hypertrophy. Left ventricular diastolic parameters are indeterminate. Right Ventricle: The right ventricular size is normal. No increase in right ventricular wall thickness. Right ventricular systolic function is normal. Left Atrium: Left atrial size was normal in size. Right Atrium: Right atrial size was normal in size. Pericardium: There is no evidence of pericardial effusion. Mitral Valve: The mitral valve is normal in structure. Trivial mitral valve regurgitation. No evidence of mitral valve stenosis. Tricuspid Valve: The tricuspid valve is normal in structure. Tricuspid valve regurgitation is trivial. No evidence of tricuspid stenosis. Aortic Valve: The aortic valve is normal in structure. Aortic valve regurgitation is not visualized. No  aortic stenosis is present. Pulmonic Valve: The pulmonic valve was normal in structure. Pulmonic valve regurgitation is not visualized. No evidence of pulmonic stenosis. Aorta: The aortic root is normal in size and structure. Venous: The inferior vena cava is normal in size with greater than 50% respiratory variability, suggesting right atrial pressure of 3 mmHg. IAS/Shunts: No atrial level shunt detected by color flow Doppler.  LEFT VENTRICLE PLAX 2D LVIDd:         4.80 cm LVIDs:         3.50 cm LV PW:         1.20 cm LV IVS:        1.20 cm LVOT diam:     2.10 cm LVOT Area:     3.46 cm  LEFT ATRIUM         Index LA diam:    2.60 cm 1.14 cm/m   AORTA Ao Root diam: 3.10 cm  SHUNTS Systemic Diam: 2.10 cm Marcina Millard MD Electronically signed by Marcina Millard MD Signature Date/Time: 11/14/2022/1:27:41 PM    Final    DG Chest 2 View  Result Date: 11/13/2022 CLINICAL DATA:  Chest pain EXAM: CHEST - 2 VIEW COMPARISON:  08/25/2022 FINDINGS: Somewhat coarse perihilar bronchovascular markings as before. No new infiltrate or overt edema. Heart size and mediastinal contours are within normal limits. CABG markers. No effusion. Sternotomy wires IMPRESSION: No acute findings. Electronically Signed   By: Corlis Leak M.D.   On: 11/13/2022 07:46   (Echo, Carotid, EGD, Colonoscopy, ERCP)    Subjective: Pt denies any complaints    Discharge Exam: Vitals:   11/15/22 1004 11/15/22 1258  BP:  116/72  Pulse: 72 98  Resp:  18  Temp:  97.8 F (36.6 C)  SpO2:  100%   Vitals:   11/15/22 0810 11/15/22 0853 11/15/22 1004 11/15/22 1258  BP: 136/82 (!) 141/77  116/72  Pulse: (!) 53 (!) 57 72 98  Resp: 17 18  18   Temp: 97.8 F (36.6 C) 97.9 F (36.6 C)  97.8 F (36.6 C)  TempSrc:  Oral     SpO2: 99% 98%  100%  Weight:      Height:        General: Pt is alert, awake, not in acute distress Cardiovascular: S1/S2 +, no rubs, no gallops Respiratory: CTA bilaterally, no wheezing, no rhonchi Abdominal:  Soft, NT,obese, bowel sounds + Extremities:  no cyanosis    The results of significant diagnostics from this hospitalization (including imaging, microbiology, ancillary and laboratory) are listed below for reference.     Microbiology: No results found for this or any previous visit (from the past 240 hour(s)).   Labs: BNP (last 3 results) Recent Labs    11/13/22 0708  BNP 91.2   Basic Metabolic Panel: Recent Labs  Lab 11/13/22 0708 11/14/22 0504 11/15/22 0444  NA 135 137 135  K 4.1 3.9 3.9  CL 106 108 105  CO2 19* 20* 22  GLUCOSE 343* 165* 156*  BUN 25* 22 24*  CREATININE 1.78* 1.25* 1.43*  CALCIUM 9.0 9.1 8.8*   Liver Function Tests: No results for input(s): "AST", "ALT", "ALKPHOS", "BILITOT", "PROT", "ALBUMIN" in the last 168 hours. No results for input(s): "LIPASE", "AMYLASE" in the last 168 hours. No results for input(s): "AMMONIA" in the last 168 hours. CBC: Recent Labs  Lab 11/13/22 0708 11/14/22 0504 11/15/22 0444  WBC 9.4 10.5 10.3  HGB 12.1* 12.6* 12.2*  HCT 37.4* 38.6* 37.3*  MCV 89.3 90.0 89.2  PLT 280 265 290   Cardiac Enzymes: No results for input(s): "CKTOTAL", "CKMB", "CKMBINDEX", "TROPONINI" in the last 168 hours. BNP: Invalid input(s): "POCBNP" CBG: Recent Labs  Lab 11/14/22 1213 11/14/22 1806 11/14/22 2052 11/15/22 0856 11/15/22 1256  GLUCAP 173* 176* 214* 160* 189*   D-Dimer No results for input(s): "DDIMER" in the last 72 hours. Hgb A1c Recent Labs    11/14/22 0504  HGBA1C 11.5*   Lipid Profile Recent Labs    11/14/22 0504  CHOL 188  HDL 32*  LDLCALC 114*  TRIG 208*  CHOLHDL 5.9   Thyroid function studies No results for input(s): "TSH", "T4TOTAL", "T3FREE", "THYROIDAB" in the last 72 hours.  Invalid input(s): "FREET3" Anemia work up No results for input(s): "VITAMINB12", "FOLATE", "FERRITIN", "TIBC", "IRON", "RETICCTPCT" in the last 72 hours. Urinalysis    Component Value Date/Time   COLORURINE YELLOW (A)  04/13/2021 1734   APPEARANCEUR CLEAR (A) 04/13/2021 1734   LABSPEC 1.007 04/13/2021 1734   PHURINE 5.0 04/13/2021 1734   GLUCOSEU >=500 (A) 04/13/2021 1734   HGBUR NEGATIVE 04/13/2021 1734   BILIRUBINUR NEGATIVE 04/13/2021 1734   KETONESUR NEGATIVE 04/13/2021 1734   PROTEINUR NEGATIVE 04/13/2021 1734   NITRITE NEGATIVE 04/13/2021 1734   LEUKOCYTESUR NEGATIVE 04/13/2021 1734   Sepsis Labs Recent Labs  Lab 11/13/22 0708 11/14/22 0504 11/15/22 0444  WBC 9.4 10.5 10.3   Microbiology No results found for this or any previous visit (from the past 240 hour(s)).   Time coordinating discharge: Over 30 minutes  SIGNED:   Charise Killian, MD  [facility] 11/15/2022, 2:45 PM Pager   If 7PM-7AM, please contact night-coverage [email or web address]

## 2022-11-15 NOTE — Consult Note (Signed)
ANTICOAGULATION CONSULT NOTE  Pharmacy Consult for Heparin Infusion Indication: chest pain/ACS  Patient Measurements: Height: 5\' 8"  (172.7 cm) Weight: 117.9 kg (260 lb) IBW/kg (Calculated) : 68.4 Heparin Dosing Weight: 95.2 kg  Labs: Recent Labs    11/13/22 0708 11/13/22 0950 11/13/22 1934 11/13/22 2110 11/14/22 0504 11/14/22 0828 11/15/22 0444  HGB 12.1*  --   --   --  12.6*  --  12.2*  HCT 37.4*  --   --   --  38.6*  --  37.3*  PLT 280  --   --   --  265  --  290  APTT 32  --   --   --   --   --   --   LABPROT 13.0  --   --   --   --   --   --   INR 1.0  --   --   --   --   --   --   HEPARINUNFRC  --   --  0.32  --  0.35  --  0.35  CREATININE 1.78*  --   --   --  1.25*  --  1.43*  TROPONINIHS 70*   < > 725* 697* 464* 345*  --    < > = values in this interval not displayed.    Estimated Creatinine Clearance: 54.8 mL/min (A) (by C-G formula based on SCr of 1.43 mg/dL (H)).  Medical History: Past Medical History:  Diagnosis Date   CAD (coronary artery disease)    CKD (chronic kidney disease)    Diabetes mellitus without complication (HCC)    GERD (gastroesophageal reflux disease)    Hyperlipemia    Hypertension     Medications:  No prior anticoagulation noted   Assessment: 77 y.o. male past medical history significant for CAD with prior CABG, obesity, hypertension, hyperlipidemia, HFrEF, presented to ED with chest pain that started while watching TV. Pain radiates to neck and lower abdomen. Troponin I initial 70 > 305 > 467. Pharmacy has been consulted to initiate and manage IV heparin therapy.    1610 1934 HL 0.32, therapeutic x 1; 1250 un/hr 0614 0504 HL 0.35, therapeutic x 2 0615 0444 HL 0.35, therapeutic x 3  Goal of Therapy:  Heparin level 0.3-0.7 units/ml Monitor platelets by anticoagulation protocol: Yes   Plan:  --Heparin level is therapeutic x 3 --Continue heparin infusion at 1250 units/hr --Heparin drip scheduled to end at 1130 today (48  hrs).  Otelia Sergeant, PharmD, [facility] 11/15/2022 5:43 AM

## 2022-11-15 NOTE — Progress Notes (Signed)
OT Cancellation Note  Patient Details Name: Justin Keith MRN: [id number] DOB: Oct 10, 1945   Cancelled Treatment:    Reason Eval/Treat Not Completed: OT screened, no needs identified, will sign off. Per PT, who just worked with patient, patient is independent functionally with mobility and ADLs. No OT needs.  Alvester Morin 11/15/2022, 10:16 AM

## 2022-11-15 NOTE — Evaluation (Signed)
Physical Therapy Evaluation Patient Details Name: Justin Keith MRN: [id number] DOB: 08-Nov-1945 Today's Date: 11/15/2022  History of Present Illness  Pt is a 77 y.o. male presenting to hospital 6/13 with c/o chest pain, abdominal pain, and HA.  Pt admitted with acute non-STEMI.  PMH includes CAD with prior CABG, obesity, htn, HLD, HFrEF, CKD.  Clinical Impression  Prior to hospital admission, pt was independent with functional mobility; lives alone in apt (elevator access to 5th floor).  Currently pt is independent with bed mobility, independent with transfers, and independent with ambulation 200 feet (no AD use).  No loss of balance noted during sessions activities.  Pt reports being at baseline level of functional mobility.  No acute PT needs identified; will sign off; OT updated on pt's status.   Recommendations for follow up therapy are one component of a multi-disciplinary discharge planning process, led by the attending physician.  Recommendations may be updated based on patient status, additional functional criteria and insurance authorization.        Assistance Recommended at Discharge PRN  Patient can return home with the following  Assist for transportation    Equipment Recommendations None recommended by PT  Recommendations for Other Services       Functional Status Assessment Patient has not had a recent decline in their functional status     Precautions / Restrictions Precautions Precautions: None Restrictions Weight Bearing Restrictions: No      Mobility  Bed Mobility Overal bed mobility: Independent             General bed mobility comments: Supine to/from sitting without any noted difficulty.    Transfers Overall transfer level: Independent Equipment used: None               General transfer comment: steady safe transfer from bed    Ambulation/Gait Ambulation/Gait assistance: Independent Gait Distance (Feet): 200 Feet Assistive device:  None Gait Pattern/deviations: Step-through pattern       General Gait Details: steady ambulation  Stairs Stairs:  (Deferred (pt uses elevator--does not need to do any steps))          Wheelchair Mobility    Modified Rankin (Stroke Patients Only)       Balance Overall balance assessment: Independent Sitting-balance support: No upper extremity supported, Feet unsupported Sitting balance-Leahy Scale: Normal Sitting balance - Comments: steady reaching outside BOS   Standing balance support: No upper extremity supported, During functional activity Standing balance-Leahy Scale: Normal Standing balance comment: steady ambulation                             Pertinent Vitals/Pain Pain Assessment Pain Assessment: No/denies pain Vitals (HR and O2 on room air) stable and WFL throughout treatment session.    Home Living Family/patient expects to be discharged to:: Private residence Living Arrangements: Alone Available Help at Discharge: Family;Available PRN/intermittently Type of Home: Apartment (5th floor) Home Access: Elevator       Home Layout: One level Home Equipment: None Additional Comments: Pt reports being on disability.    Prior Function Prior Level of Function : Independent/Modified Independent             Mobility Comments: No recent falls reported. ADLs Comments: Does not drive (d/t not having a car)--pt's brother or niece drive pt as needed.     Hand Dominance        Extremity/Trunk Assessment   Upper Extremity Assessment Upper Extremity Assessment: Overall [facility]  for tasks assessed    Lower Extremity Assessment Lower Extremity Assessment: Overall WFL for tasks assessed    Cervical / Trunk Assessment Cervical / Trunk Assessment: Normal  Communication   Communication: No difficulties  Cognition Arousal/Alertness: Awake/alert Behavior During Therapy: WFL for tasks assessed/performed Overall Cognitive Status: Within Functional  Limits for tasks assessed                                          General Comments  Nursing cleared pt for participation in physical therapy.  Pt agreeable to PT session.    Exercises     Assessment/Plan    PT Assessment Patient does not need any further PT services  PT Problem List         PT Treatment Interventions      PT Goals (Current goals can be found in the Care Plan section)  Acute Rehab PT Goals Patient Stated Goal: to go home PT Goal Formulation: With patient Time For Goal Achievement: 11/29/22 Potential to Achieve Goals: Good    Frequency       Co-evaluation               AM-PAC PT "6 Clicks" Mobility  Outcome Measure Help needed turning from your back to your side while in a flat bed without using bedrails?: None Help needed moving from lying on your back to sitting on the side of a flat bed without using bedrails?: None Help needed moving to and from a bed to a chair (including a wheelchair)?: None Help needed standing up from a chair using your arms (e.g., wheelchair or bedside chair)?: None Help needed to walk in hospital room?: None Help needed climbing 3-5 steps with a railing? : None 6 Click Score: 24    End of Session Equipment Utilized During Treatment: Gait belt Activity Tolerance: Patient tolerated treatment well Patient left: in bed;with call bell/phone within reach;with bed alarm set Nurse Communication: Mobility status;Precautions PT Visit Diagnosis: Other abnormalities of gait and mobility (R26.89)    Time: 0865-7846 PT Time Calculation (min) (ACUTE ONLY): 13 min   Charges:   PT Evaluation $PT Eval Low Complexity: 1 Low         Gwynevere Lizana, PT 11/15/22, 3:17 PM

## 2022-11-15 NOTE — Progress Notes (Signed)
[facility] Cardiology    SUBJECTIVE: Patient resting comfortably in bed denies any further chest pain no shortness of breath   Vitals:   11/15/22 0002 11/15/22 0500 11/15/22 0810 11/15/22 0853  BP: 130/66 (!) 140/73 136/82 (!) 141/77  Pulse: (!) 56 (!) 58 (!) 53 (!) 57  Resp: 18 16 17 18   Temp: 98 F (36.7 C) 98 F (36.7 C) 97.8 F (36.6 C) 97.9 F (36.6 C)  TempSrc:  Oral Oral   SpO2: 97% 99% 99% 98%  Weight:      Height:         Intake/Output Summary (Last 24 hours) at 11/15/2022 0933 Last data filed at 11/15/2022 0700 Gross per 24 hour  Intake 534 ml  Output 1375 ml  Net -841 ml      PHYSICAL EXAM  General: Well developed, well nourished, in no acute distress HEENT:  Normocephalic and atramatic Neck:  No JVD.  Lungs: Clear bilaterally to auscultation and percussion. Heart: HRRR . Normal S1 and S2 without gallops or murmurs.  Abdomen: Bowel sounds are positive, abdomen soft and non-tender  Msk:  Back normal, normal gait. Normal strength and tone for age. Extremities: No clubbing, cyanosis or edema.   Neuro: Alert and oriented X 3. Psych:  Good affect, responds appropriately   LABS: Basic Metabolic Panel: Recent Labs    11/14/22 0504 11/15/22 0444  NA 137 135  K 3.9 3.9  CL 108 105  CO2 20* 22  GLUCOSE 165* 156*  BUN 22 24*  CREATININE 1.25* 1.43*  CALCIUM 9.1 8.8*   Liver Function Tests: No results for input(s): "AST", "ALT", "ALKPHOS", "BILITOT", "PROT", "ALBUMIN" in the last 72 hours. No results for input(s): "LIPASE", "AMYLASE" in the last 72 hours. CBC: Recent Labs    11/14/22 0504 11/15/22 0444  WBC 10.5 10.3  HGB 12.6* 12.2*  HCT 38.6* 37.3*  MCV 90.0 89.2  PLT 265 290   Cardiac Enzymes: No results for input(s): "CKTOTAL", "CKMB", "CKMBINDEX", "TROPONINI" in the last 72 hours. BNP: Invalid input(s): "POCBNP" D-Dimer: No results for input(s): "DDIMER" in the last 72 hours. Hemoglobin A1C: Recent Labs    11/14/22 0504  HGBA1C 11.5*    Fasting Lipid Panel: Recent Labs    11/14/22 0504  CHOL 188  HDL 32*  LDLCALC 114*  TRIG 208*  CHOLHDL 5.9   Thyroid Function Tests: No results for input(s): "TSH", "T4TOTAL", "T3FREE", "THYROIDAB" in the last 72 hours.  Invalid input(s): "FREET3" Anemia Panel: No results for input(s): "VITAMINB12", "FOLATE", "FERRITIN", "TIBC", "IRON", "RETICCTPCT" in the last 72 hours.  ECHOCARDIOGRAM COMPLETE  Result Date: 11/14/2022    ECHOCARDIOGRAM REPORT   Patient Name:   Justin Keith Date of Exam: 11/14/2022 Medical Rec #:  [id number]        Height:       68.0 in Accession #:    [id number]       Weight:       260.0 lb Date of Birth:  July 21, 1945        BSA:          2.285 m Patient Age:    77 years         BP:           144/84 mmHg Patient Gender: M                HR:           46 bpm. Exam Location:  [facility] Procedure: 2D Echo, Cardiac Doppler and  Color Doppler Indications:     Chest pain R07.9  History:         Patient has prior history of Echocardiogram examinations, most                  recent 10/28/2021. CAD, Prior CABG; Risk Factors:Hypertension                  and Diabetes. CKD.  Sonographer:     Cristela Blue Referring Phys:  [id number] LILY MICHELLE TANG Diagnosing Phys: Marcina Millard MD  Sonographer Comments: Technically challenging study due to limited acoustic windows, no apical window and no subcostal window. IMPRESSIONS  1. Left ventricular ejection fraction, by estimation, is 50 to 55%. The left ventricle has low normal function. The left ventricle has no regional wall motion abnormalities. Left ventricular diastolic parameters are indeterminate.  2. Right ventricular systolic function is normal. The right ventricular size is normal.  3. The mitral valve is normal in structure. Trivial mitral valve regurgitation. No evidence of mitral stenosis.  4. The aortic valve is normal in structure. Aortic valve regurgitation is not visualized. No aortic stenosis is present.  5. The inferior vena  cava is normal in size with greater than 50% respiratory variability, suggesting right atrial pressure of 3 mmHg. FINDINGS  Left Ventricle: Left ventricular ejection fraction, by estimation, is 50 to 55%. The left ventricle has low normal function. The left ventricle has no regional wall motion abnormalities. The left ventricular internal cavity size was normal in size. There is no left ventricular hypertrophy. Left ventricular diastolic parameters are indeterminate. Right Ventricle: The right ventricular size is normal. No increase in right ventricular wall thickness. Right ventricular systolic function is normal. Left Atrium: Left atrial size was normal in size. Right Atrium: Right atrial size was normal in size. Pericardium: There is no evidence of pericardial effusion. Mitral Valve: The mitral valve is normal in structure. Trivial mitral valve regurgitation. No evidence of mitral valve stenosis. Tricuspid Valve: The tricuspid valve is normal in structure. Tricuspid valve regurgitation is trivial. No evidence of tricuspid stenosis. Aortic Valve: The aortic valve is normal in structure. Aortic valve regurgitation is not visualized. No aortic stenosis is present. Pulmonic Valve: The pulmonic valve was normal in structure. Pulmonic valve regurgitation is not visualized. No evidence of pulmonic stenosis. Aorta: The aortic root is normal in size and structure. Venous: The inferior vena cava is normal in size with greater than 50% respiratory variability, suggesting right atrial pressure of 3 mmHg. IAS/Shunts: No atrial level shunt detected by color flow Doppler.  LEFT VENTRICLE PLAX 2D LVIDd:         4.80 cm LVIDs:         3.50 cm LV PW:         1.20 cm LV IVS:        1.20 cm LVOT diam:     2.10 cm LVOT Area:     3.46 cm  LEFT ATRIUM         Index LA diam:    2.60 cm 1.14 cm/m   AORTA Ao Root diam: 3.10 cm  SHUNTS Systemic Diam: 2.10 cm Marcina Millard MD Electronically signed by Marcina Millard MD Signature  Date/Time: 11/14/2022/1:27:41 PM    Final      Echo borderline left-ventricular function EF around 50 to 55%  TELEMETRY: Sinus bradycardia 58 nonspecific ST-T changes:  ASSESSMENT AND PLAN:  Principal Problem:   Acute non-ST elevation myocardial infarction (NSTEMI) (HCC) Active Problems:  Essential hypertension   CAD (coronary artery disease)   Stage 3b chronic kidney disease (HCC)   Dyslipidemia   Morbid obesity (HCC)   Type II diabetes mellitus with renal manifestations (HCC)   Chronic diastolic CHF (congestive heart failure) (HCC)    Plan Multivessel coronary disease with chest pain symptoms possibly related to angina patient pain-free now on heparin elevated troponin peak at around 800 reasonably stable now recommend medical therapy Chronic renal insufficiency creatinine 1.7 continue adequate hydration follow-up with nephrology Hyperlipidemia continue statin therapy for lipid management Diabetes type 2 continue diabetes management and control Hypertension continue medical therapy for hypertension management and control Recommend transitioning back to medical therapy GDMT including nitrates low-dose ARB or ACE aspirin Plavix statin.  Consider low-dose beta-blocker patient has history of bradycardia Do not recommend invasive procedure at this stage we will try to treat the patient medically and follow-up in the office   Alwyn Pea, MD 11/15/2022 9:33 AM

## 2022-11-28 ENCOUNTER — Emergency Department: Payer: 59

## 2022-11-28 ENCOUNTER — Other Ambulatory Visit: Payer: Self-pay

## 2022-11-28 ENCOUNTER — Emergency Department
Admission: EM | Admit: 2022-11-28 | Discharge: 2022-11-29 | Disposition: A | Discharge status: Home / Self Care | Payer: 59 | Attending: Emergency Medicine | Admitting: Emergency Medicine

## 2022-11-28 DIAGNOSIS — R7989 Other specified abnormal findings of blood chemistry: Secondary | ICD-10-CM | POA: Insufficient documentation

## 2022-11-28 DIAGNOSIS — E1122 Type 2 diabetes mellitus with diabetic chronic kidney disease: Secondary | ICD-10-CM | POA: Insufficient documentation

## 2022-11-28 DIAGNOSIS — N189 Chronic kidney disease, unspecified: Secondary | ICD-10-CM | POA: Diagnosis not present

## 2022-11-28 DIAGNOSIS — R079 Chest pain, unspecified: Secondary | ICD-10-CM | POA: Insufficient documentation

## 2022-11-28 LAB — BASIC METABOLIC PANEL
Anion gap: 10 (ref 5–15)
BUN: 34 mg/dL — ABNORMAL HIGH (ref 8–23)
CO2: 20 mmol/L — ABNORMAL LOW (ref 22–32)
Calcium: 9.6 mg/dL (ref 8.9–10.3)
Chloride: 103 mmol/L (ref 98–111)
Creatinine, Ser: 1.99 mg/dL — ABNORMAL HIGH (ref 0.61–1.24)
GFR, Estimated: 34 mL/min — ABNORMAL LOW (ref >60)
Glucose, Bld: 214 mg/dL — ABNORMAL HIGH (ref 70–99)
Potassium: 4.1 mmol/L (ref 3.5–5.1)
Sodium: 133 mmol/L — ABNORMAL LOW (ref 135–145)

## 2022-11-28 LAB — CBC
HCT: 38.7 % — ABNORMAL LOW (ref 39.0–52.0)
Hemoglobin: 12.8 g/dL — ABNORMAL LOW (ref 13.0–17.0)
MCH: 29.3 pg (ref 26.0–34.0)
MCHC: 33.1 g/dL (ref 30.0–36.0)
MCV: 88.6 fL (ref 80.0–100.0)
Platelets: 331 10*3/uL (ref 150–400)
RBC: 4.37 MIL/uL (ref 4.22–5.81)
RDW: 14.6 % (ref 11.5–15.5)
WBC: 12.5 10*3/uL — ABNORMAL HIGH (ref 4.0–10.5)
nRBC: 0 % (ref 0.0–0.2)

## 2022-11-28 LAB — TROPONIN I (HIGH SENSITIVITY): Troponin I (High Sensitivity): 94 ng/L — ABNORMAL HIGH (ref <18)

## 2022-11-28 NOTE — ED Triage Notes (Signed)
Pt to ed from home via [facility] for CP. Pt was just DC from admission stay for same. Pt has HX of MI in 2005.  Pt denies nausea. Pt rates at 10/10  70 HR Sinus with 1st degree block with several PVCs. 97% on RA 192/78 146/P 253BGL Hx of same 324mg  ASA 1 SL NTG 18 G RAC.

## 2022-11-29 LAB — TROPONIN I (HIGH SENSITIVITY): Troponin I (High Sensitivity): 80 ng/L — ABNORMAL HIGH (ref <18)

## 2022-11-29 MED ORDER — SODIUM CHLORIDE 0.9 % IV BOLUS
500.0000 mL | Freq: Once | INTRAVENOUS | Status: AC
  Administered 2022-11-29: 500 mL via INTRAVENOUS

## 2022-11-29 NOTE — ED Provider Notes (Signed)
[facility] Provider Note    Event Date/Time   First MD Initiated Contact with Patient 11/28/22 2344     (approximate)   History   Chest Pain (chronic)   HPI Justin Keith is a 77 y.o. male who presents for evaluation of chest pain.  He was just in the hospital for the same and had an MI almost 20 years ago.  He was told that his troponin was elevated but that he does not think he had a heart attack recently.  He had chest pain at home but it has completely resolved and he feels fine.  No fever, no shortness of breath, no passing out nor lightheadedness.  No nausea nor vomiting.  No back pain.     Physical Exam   Triage Vital Signs: ED Triage Vitals  Enc Vitals Group     BP 11/28/22 2051 (!) 184/81     Pulse Rate 11/28/22 2051 65     Resp 11/28/22 2051 16     Temp 11/28/22 2051 98 F (36.7 C)     Temp Source 11/28/22 2051 Oral     SpO2 11/28/22 2051 100 %     Weight 11/28/22 2048 117 kg (257 lb 15 oz)     Height 11/28/22 2048 1.727 m (5\' 8" )     Head Circumference --      Peak Flow --      Pain Score 11/28/22 2048 0     Pain Loc --      Pain Edu? --      Excl. in GC? --     Most recent vital signs: Vitals:   11/28/22 2051 11/28/22 2239  BP: (!) 184/81 136/77  Pulse: 65 (!) 59  Resp: 16 14  Temp: 98 F (36.7 C) 98.3 F (36.8 C)  SpO2: 100% 99%    General: Awake, no distress.  Generally well-appearing. CV:  Good peripheral perfusion.  Normal heart sounds, regular rate and rhythm. Resp:  Normal effort. Speaking easily and comfortably, no accessory muscle usage nor intercostal retractions.   Abd:  No distention.  No tenderness to palpation of the abdomen.  No pulsatile masses. Other:  Normal mood and affect.   ED Results / Procedures / Treatments   Labs (all labs ordered are listed, but only abnormal results are displayed) Labs Reviewed  BASIC METABOLIC PANEL - Abnormal; Notable for the following components:      Result Value    Sodium 133 (*)    CO2 20 (*)    Glucose, Bld 214 (*)    BUN 34 (*)    Creatinine, Ser 1.99 (*)    GFR, Estimated 34 (*)    All other components within normal limits  CBC - Abnormal; Notable for the following components:   WBC 12.5 (*)    Hemoglobin 12.8 (*)    HCT 38.7 (*)    All other components within normal limits  TROPONIN I (HIGH SENSITIVITY) - Abnormal; Notable for the following components:   Troponin I (High Sensitivity) 94 (*)    All other components within normal limits  TROPONIN I (HIGH SENSITIVITY) - Abnormal; Notable for the following components:   Troponin I (High Sensitivity) 80 (*)    All other components within normal limits     EKG  ED ECG REPORT I, Loleta Rose, the attending physician, personally viewed and interpreted this ECG.  Date: 11/28/2022 EKG Time: 20: 52 Rate: 62 Rhythm: sinus rhythm w/ 1st degree AV  block QRS Axis: normal Intervals: first degree AV block ST/T Wave abnormalities: Non-specific ST segment / T-wave changes, but no clear evidence of acute ischemia. Narrative Interpretation: no definitive evidence of acute ischemia; does not meet STEMI criteria.    RADIOLOGY I viewed and interpreted the patient's two-view chest x-ray.  There is no indication of pneumonia, widened mediastinum, nor other acute abnormality.   PROCEDURES:  Critical Care performed: No  .1-3 Lead EKG Interpretation  Performed by: Loleta Rose, MD Authorized by: Loleta Rose, MD     Interpretation: normal     ECG rate:  60   ECG rate assessment: normal     Rhythm: sinus rhythm     Ectopy: none     Conduction: normal       IMPRESSION / MDM / ASSESSMENT AND PLAN / ED COURSE  I reviewed the triage vital signs and the nursing notes.                              Differential diagnosis includes, but is not limited to, angina, ACS including unstable angina, pneumonia, pericarditis/myocarditis, PE, AAS.  Patient's presentation is most consistent with  acute presentation with potential threat to life or bodily function.  Labs/studies ordered: High-sensitivity troponin x 2, CBC, BMP, two-view chest x-ray, EKG  Interventions/Medications given:  Medications  sodium chloride 0.9 % bolus 500 mL (500 mLs Intravenous New Bag/Given 11/29/22 0026)    (Note:  hospital course my include additional interventions and/or labs/studies not listed above.)   I reviewed the medical record including the discharge summary written by Dr. Mayford Knife on 11/15/2022 (approximately 2 weeks ago).  It seems that he had a mild NSTEMI, has a preserved ejection fraction (more or less, with an EF between 50 and 55%), and generally poorly controlled diabetes.  He has baseline chronic kidney disease with a creatinine of approximately 1.7 but they made note of the fact that his creatinine is labile.  The patient is on the cardiac monitor to evaluate for evidence of arrhythmia and/or significant heart rate changes.  The patient is currently asymptomatic and his evaluation is quite reassuring.  Although his creatinine is slightly elevated above baseline at nearly 2, this is not far from his baseline in terms of his GFR, and he says he has been eating and drinking normally.  I ordered normal saline 500 mL bolus but I do not want to overload him.  He said he feels fine and just wanted to make sure everything was okay.  His initial high-sensitivity troponin is elevated at just under 100, but this is likely still coming down from before.  As long as his repeat is trending downward or staying roughly the same, for this otherwise asymptomatic patient I think it is appropriate to discharge him.  I considered hospitalization but his symptoms seem more chronic than acute and he should be appropriate for follow-up with Dr. Juliann Pares, whom he identifies as his primary cardiologist.  I already gave him my usual and customary return precautions and he can be discharged after the fluids if his  troponin is looking appropriate.   Clinical Course as of 11/29/22 0049  Sat Nov 29, 2022  0035 Troponin I (High Sensitivity)(!): 80 Troponin is decreasing.  Will discharge as per previously described plan after fluids. [CF]    Clinical Course User Index [CF] Loleta Rose, MD     FINAL CLINICAL IMPRESSION(S) / ED DIAGNOSES   Final diagnoses:  Chest pain, unspecified type  Chronic kidney disease, unspecified CKD stage  Elevated troponin level     Rx / DC Orders   ED Discharge Orders          Ordered    Ambulatory referral to Cardiology       Comments: If you have not heard from the Cardiology office within the next 72 hours please call [phone].   11/29/22 0049             Note:  This document was prepared using Dragon voice recognition software and may include unintentional dictation errors.   Loleta Rose, MD 11/29/22 [phone]

## 2022-11-29 NOTE — Discharge Instructions (Addendum)

## 2024-07-12 ENCOUNTER — Ambulatory Visit
